# Patient Record
Sex: Female | Born: 1950 | Race: White | Hispanic: No | Marital: Married | Smoking: Never smoker
Health system: Southern US, Community
[De-identification: ages and names within clinical notes are randomized; demographics above are authoritative.]

## PROBLEM LIST (undated history)

## (undated) DIAGNOSIS — Z9989 Dependence on other enabling machines and devices: Secondary | ICD-10-CM

## (undated) DIAGNOSIS — G4733 Obstructive sleep apnea (adult) (pediatric): Secondary | ICD-10-CM

## (undated) DIAGNOSIS — I10 Essential (primary) hypertension: Secondary | ICD-10-CM

## (undated) DIAGNOSIS — R079 Chest pain, unspecified: Secondary | ICD-10-CM

## (undated) DIAGNOSIS — C801 Malignant (primary) neoplasm, unspecified: Secondary | ICD-10-CM

## (undated) DIAGNOSIS — E079 Disorder of thyroid, unspecified: Secondary | ICD-10-CM

## (undated) DIAGNOSIS — M1990 Unspecified osteoarthritis, unspecified site: Secondary | ICD-10-CM

## (undated) DIAGNOSIS — F32A Depression, unspecified: Secondary | ICD-10-CM

## (undated) DIAGNOSIS — K219 Gastro-esophageal reflux disease without esophagitis: Secondary | ICD-10-CM

## (undated) DIAGNOSIS — E042 Nontoxic multinodular goiter: Secondary | ICD-10-CM

## (undated) DIAGNOSIS — J309 Allergic rhinitis, unspecified: Secondary | ICD-10-CM

## (undated) DIAGNOSIS — D649 Anemia, unspecified: Secondary | ICD-10-CM

## (undated) DIAGNOSIS — Z8669 Personal history of other diseases of the nervous system and sense organs: Secondary | ICD-10-CM

## (undated) DIAGNOSIS — F411 Generalized anxiety disorder: Secondary | ICD-10-CM

## (undated) HISTORY — DX: Depression, unspecified: F32.A

## (undated) HISTORY — DX: Malignant (primary) neoplasm, unspecified: C80.1

## (undated) HISTORY — DX: Disorder of thyroid, unspecified: E07.9

## (undated) HISTORY — DX: Gastro-esophageal reflux disease without esophagitis: K21.9

## (undated) HISTORY — DX: Allergic rhinitis, unspecified: J30.9

## (undated) HISTORY — DX: Unspecified osteoarthritis, unspecified site: M19.90

## (undated) HISTORY — PX: NASAL SINUS SURGERY: SHX719

## (undated) HISTORY — DX: Obstructive sleep apnea (adult) (pediatric): G47.33

## (undated) HISTORY — DX: Essential (primary) hypertension: I10

## (undated) HISTORY — DX: Personal history of other diseases of the nervous system and sense organs: Z86.69

## (undated) HISTORY — DX: Chest pain, unspecified: R07.9

## (undated) HISTORY — DX: Dependence on other enabling machines and devices: Z99.89

## (undated) HISTORY — DX: Anemia, unspecified: D64.9

## (undated) HISTORY — DX: Nontoxic multinodular goiter: E04.2

## (undated) HISTORY — PX: CHOLECYSTECTOMY: SHX55

---

## 1992-02-10 HISTORY — PX: TUBAL LIGATION: SHX77

## 1998-04-27 ENCOUNTER — Other Ambulatory Visit: Admission: RE | Admit: 1998-04-27 | Discharge: 1998-04-27 | Payer: Self-pay | Admitting: *Deleted

## 1998-06-03 ENCOUNTER — Ambulatory Visit (HOSPITAL_COMMUNITY): Admission: RE | Admit: 1998-06-03 | Discharge: 1998-06-03 | Payer: Self-pay | Admitting: *Deleted

## 1998-09-20 ENCOUNTER — Other Ambulatory Visit: Admission: RE | Admit: 1998-09-20 | Discharge: 1998-09-20 | Payer: Self-pay | Admitting: *Deleted

## 1999-08-27 ENCOUNTER — Encounter: Admission: RE | Admit: 1999-08-27 | Discharge: 1999-08-27 | Payer: Self-pay | Admitting: *Deleted

## 1999-08-27 ENCOUNTER — Encounter: Payer: Self-pay | Admitting: *Deleted

## 1999-08-27 ENCOUNTER — Other Ambulatory Visit: Admission: RE | Admit: 1999-08-27 | Discharge: 1999-08-27 | Payer: Self-pay | Admitting: *Deleted

## 2000-08-27 ENCOUNTER — Encounter: Admission: RE | Admit: 2000-08-27 | Discharge: 2000-08-27 | Payer: Self-pay | Admitting: *Deleted

## 2000-08-27 ENCOUNTER — Other Ambulatory Visit: Admission: RE | Admit: 2000-08-27 | Discharge: 2000-08-27 | Payer: Self-pay | Admitting: Obstetrics and Gynecology

## 2000-08-27 ENCOUNTER — Encounter: Payer: Self-pay | Admitting: *Deleted

## 2001-09-02 ENCOUNTER — Encounter: Admission: RE | Admit: 2001-09-02 | Discharge: 2001-09-02 | Payer: Self-pay | Admitting: *Deleted

## 2001-09-02 ENCOUNTER — Other Ambulatory Visit: Admission: RE | Admit: 2001-09-02 | Discharge: 2001-09-02 | Payer: Self-pay | Admitting: Obstetrics and Gynecology

## 2001-09-02 ENCOUNTER — Encounter: Payer: Self-pay | Admitting: *Deleted

## 2002-09-04 ENCOUNTER — Encounter: Payer: Self-pay | Admitting: Obstetrics and Gynecology

## 2002-09-04 ENCOUNTER — Encounter: Admission: RE | Admit: 2002-09-04 | Discharge: 2002-09-04 | Payer: Self-pay | Admitting: Obstetrics and Gynecology

## 2002-09-04 ENCOUNTER — Other Ambulatory Visit: Admission: RE | Admit: 2002-09-04 | Discharge: 2002-09-04 | Payer: Self-pay | Admitting: Obstetrics and Gynecology

## 2004-01-30 ENCOUNTER — Other Ambulatory Visit: Payer: Self-pay

## 2004-01-30 ENCOUNTER — Emergency Department: Payer: Self-pay | Admitting: Internal Medicine

## 2004-01-31 ENCOUNTER — Ambulatory Visit: Payer: Self-pay | Admitting: Internal Medicine

## 2005-05-13 ENCOUNTER — Encounter: Admission: RE | Admit: 2005-05-13 | Discharge: 2005-05-13 | Payer: Self-pay | Admitting: Internal Medicine

## 2005-06-03 ENCOUNTER — Encounter: Admission: RE | Admit: 2005-06-03 | Discharge: 2005-06-03 | Payer: Self-pay | Admitting: Internal Medicine

## 2005-06-08 ENCOUNTER — Emergency Department: Payer: Self-pay | Admitting: Unknown Physician Specialty

## 2005-09-16 ENCOUNTER — Ambulatory Visit: Payer: Self-pay | Admitting: Otolaryngology

## 2006-06-16 ENCOUNTER — Encounter: Admission: RE | Admit: 2006-06-16 | Discharge: 2006-06-16 | Payer: Self-pay | Admitting: Obstetrics and Gynecology

## 2006-07-27 ENCOUNTER — Ambulatory Visit: Payer: Self-pay | Admitting: Unknown Physician Specialty

## 2006-10-03 ENCOUNTER — Ambulatory Visit: Payer: Self-pay | Admitting: Emergency Medicine

## 2007-09-15 ENCOUNTER — Encounter: Admission: RE | Admit: 2007-09-15 | Discharge: 2007-09-15 | Payer: Self-pay | Admitting: Obstetrics and Gynecology

## 2007-09-20 ENCOUNTER — Ambulatory Visit: Payer: Self-pay | Admitting: Unknown Physician Specialty

## 2008-09-21 ENCOUNTER — Ambulatory Visit: Payer: Self-pay | Admitting: Internal Medicine

## 2008-10-19 DIAGNOSIS — I10 Essential (primary) hypertension: Secondary | ICD-10-CM | POA: Insufficient documentation

## 2009-04-22 ENCOUNTER — Encounter: Admission: RE | Admit: 2009-04-22 | Discharge: 2009-04-22 | Payer: Self-pay | Admitting: Obstetrics and Gynecology

## 2010-03-02 ENCOUNTER — Encounter: Payer: Self-pay | Admitting: Internal Medicine

## 2010-03-03 ENCOUNTER — Encounter: Payer: Self-pay | Admitting: Obstetrics and Gynecology

## 2010-03-20 ENCOUNTER — Other Ambulatory Visit: Payer: Self-pay | Admitting: Obstetrics and Gynecology

## 2010-03-20 DIAGNOSIS — Z1231 Encounter for screening mammogram for malignant neoplasm of breast: Secondary | ICD-10-CM

## 2010-04-03 ENCOUNTER — Other Ambulatory Visit: Payer: Self-pay | Admitting: Obstetrics and Gynecology

## 2010-04-03 DIAGNOSIS — Z780 Asymptomatic menopausal state: Secondary | ICD-10-CM

## 2010-04-28 ENCOUNTER — Other Ambulatory Visit: Payer: Self-pay

## 2010-04-28 ENCOUNTER — Ambulatory Visit: Payer: Self-pay

## 2010-05-15 ENCOUNTER — Ambulatory Visit
Admission: RE | Admit: 2010-05-15 | Discharge: 2010-05-15 | Disposition: A | Discharge status: Home / Self Care | Payer: BC Managed Care – PPO | Source: Ambulatory Visit | Attending: Obstetrics and Gynecology | Admitting: Obstetrics and Gynecology

## 2010-05-15 DIAGNOSIS — Z780 Asymptomatic menopausal state: Secondary | ICD-10-CM

## 2010-05-15 DIAGNOSIS — Z1231 Encounter for screening mammogram for malignant neoplasm of breast: Secondary | ICD-10-CM

## 2010-10-01 ENCOUNTER — Other Ambulatory Visit: Payer: Self-pay | Admitting: Obstetrics and Gynecology

## 2010-10-01 DIAGNOSIS — N63 Unspecified lump in unspecified breast: Secondary | ICD-10-CM

## 2010-10-01 DIAGNOSIS — N644 Mastodynia: Secondary | ICD-10-CM

## 2010-10-06 ENCOUNTER — Ambulatory Visit
Admission: RE | Admit: 2010-10-06 | Discharge: 2010-10-06 | Disposition: A | Discharge status: Home / Self Care | Payer: BC Managed Care – PPO | Source: Ambulatory Visit | Attending: Obstetrics and Gynecology | Admitting: Obstetrics and Gynecology

## 2010-10-06 DIAGNOSIS — N63 Unspecified lump in unspecified breast: Secondary | ICD-10-CM

## 2010-10-06 DIAGNOSIS — N644 Mastodynia: Secondary | ICD-10-CM

## 2010-12-04 ENCOUNTER — Encounter (INDEPENDENT_AMBULATORY_CARE_PROVIDER_SITE_OTHER): Payer: Self-pay | Admitting: General Surgery

## 2010-12-04 ENCOUNTER — Ambulatory Visit (INDEPENDENT_AMBULATORY_CARE_PROVIDER_SITE_OTHER): Payer: BC Managed Care – PPO | Admitting: General Surgery

## 2010-12-04 VITALS — BP 146/90 | HR 64 | Temp 97.1°F | Resp 14 | Ht 64.0 in | Wt 225.2 lb

## 2010-12-04 DIAGNOSIS — N6009 Solitary cyst of unspecified breast: Secondary | ICD-10-CM | POA: Insufficient documentation

## 2010-12-04 NOTE — Patient Instructions (Signed)

## 2010-12-04 NOTE — Progress Notes (Signed)
Chief Complaint  Patient presents with  . Other    Patient requested 2nd opinion. MGM & [place] at [facility] on 10/05/10.    HPI Lisa Chambers is a 60 y.o. female.   HPI This is a 60 year old female with no prior history of any breast complaints. She does have a family history of a mom with inflammatory breast cancer. She comes in today with a several month history of a left subareolar mass that is somewhat increased in size over this time. She reports no other complaints referable to her breast including any nipple discharge, tenderness, or breast masses. She underwent a mammogram that wasn't negative and an ultrasound that showed a small simple cyst in this area. She requested evaluation for treatment of this. Past Medical History  Diagnosis Date  . GERD (gastroesophageal reflux disease)   . Thyroid disease   . Cancer     skin  . Asthma   . Hearing loss     Past Surgical History  Procedure Date  . Tubal ligation 1994  . Nasal sinus surgery 1996, 2007    Family History  Problem Relation Age of Onset  . Cancer Mother     breast  . Cancer Father     lung  . Pneumonia Father   . Cancer Maternal Grandmother     colon    Social History History  Substance Use Topics  . Smoking status: Never Smoker   . Smokeless tobacco: Never Used  . Alcohol Use: No    Allergies  Allergen Reactions  . Aspirin Swelling    Lips only.    Current Outpatient Prescriptions  Medication Sig Dispense Refill  . ABILIFY 2 MG tablet 2 mg daily.       . APLENZIN 522 MG TB24 daily.       . FEMHRT LOW DOSE 0.5-2.5 MG-MCG per tablet 1 tablet daily.       Marland Kitchen Fexofenadine HCl (ALLEGRA PO) Take by mouth as needed.        . Fluticasone-Salmeterol (ADVAIR HFA IN) Inhale into the lungs 2 (two) times daily.        . hydrochlorothiazide (MICROZIDE) 12.5 MG capsule Take 12.5 mg by mouth daily.        Marland Kitchen levothyroxine (SYNTHROID) 25 MCG tablet Take 25 mcg by mouth daily.        Marland Kitchen LORazepam (ATIVAN) 0.5 MG  tablet 0.5 mg as needed.       . montelukast (SINGULAIR) 10 MG tablet 10 mg daily.       Marland Kitchen PRILOSEC OTC 20 MG tablet 20 mg daily.       . Pseudoephedrine-Guaifenesin (PSEUDOVENT PO) Take by mouth as needed.        Marland Kitchen VIIBRYD 40 MG TABS 40 mg daily.       . fluticasone (FLONASE) 50 MCG/ACT nasal spray 2 sprays daily.         Review of Systems Review of Systems  HENT: Positive for hearing loss.   All other systems reviewed and are negative.    Blood pressure 146/90, pulse 64, temperature 97.1 F (36.2 C), temperature source Temporal, resp. rate 14, height 5\' 4"  (1.626 m), weight 225 lb 3.2 oz (102.15 kg).  Physical Exam Physical Exam  Constitutional: She appears well-developed and well-nourished.  Eyes: No scleral icterus.  Neck: Neck supple.  Cardiovascular: Normal rate, regular rhythm and normal heart sounds.   Pulmonary/Chest: Effort normal and breath sounds normal. She has no wheezes. She has no rales.  Right breast exhibits no inverted nipple, no mass, no nipple discharge, no skin change and no tenderness. Left breast exhibits mass. Left breast exhibits no inverted nipple, no nipple discharge, no skin change and no tenderness. Breasts are symmetrical.    Lymphadenopathy:    She has no cervical adenopathy.    She has no axillary adenopathy.    Data Reviewed *RADIOLOGY REPORT*  Clinical Data: Palpable lump left breast  DIGITAL DIAGNOSTIC LEFT MAMMOGRAM WITH CAD AND LEFT BREAST  ULTRASOUND:  Comparison: Jun 16, 2006, September 15, 2007, May 15, 2010, April 22, 2009  Findings: CC and MLO views of the left breast and spot tangential  view of left breast are submitted. Scattered fibroglandular tissue  noted in the left breast unchanged. No suspicious abnormality is  identified within the left breast.  Mammographic images were processed with CAD.  Ultrasound is performed, showing a 0.56 cm oval simple cyst at the  left breast nine o'clock position subareolar region palpable area.    IMPRESSION:  Benign findings, recommend routine screening mammogram back on  schedule    Assessment    Right breast cyst Family history of breast cancer    Plan    We discussed that this is almost certainly a benign cyst. She does have a family history of breast cancer and is very concerned about this. We went over all of the different options what we've decided upon is to send her back to the breast center to  have them aspirate this cyst is the first. I think it would be a reasonable step and she is very happy with this solution also.       Seara Hinesley 12/04/2010, 11:00 AM

## 2010-12-17 ENCOUNTER — Ambulatory Visit
Admission: RE | Admit: 2010-12-17 | Discharge: 2010-12-17 | Disposition: A | Discharge status: Home / Self Care | Payer: BC Managed Care – PPO | Source: Ambulatory Visit | Attending: General Surgery | Admitting: General Surgery

## 2010-12-17 ENCOUNTER — Other Ambulatory Visit (INDEPENDENT_AMBULATORY_CARE_PROVIDER_SITE_OTHER): Payer: Self-pay | Admitting: General Surgery

## 2010-12-17 DIAGNOSIS — N6009 Solitary cyst of unspecified breast: Secondary | ICD-10-CM

## 2010-12-19 ENCOUNTER — Telehealth (INDEPENDENT_AMBULATORY_CARE_PROVIDER_SITE_OTHER): Payer: Self-pay

## 2010-12-19 NOTE — Telephone Encounter (Signed)
LMOM for pt to call me so I can give her some results per Dr Rolan Bucco

## 2010-12-19 NOTE — Telephone Encounter (Signed)
Returned my call so I gave her the good news about the cyst going away and pt not needing an appt with Dr Dwain Sarna unless if she feels something again. The pt just needs to do her self exams and follow up with her mgm's./ [facility]

## 2011-04-14 ENCOUNTER — Other Ambulatory Visit: Payer: Self-pay | Admitting: Obstetrics and Gynecology

## 2011-04-14 DIAGNOSIS — Z1231 Encounter for screening mammogram for malignant neoplasm of breast: Secondary | ICD-10-CM

## 2011-05-19 ENCOUNTER — Ambulatory Visit
Admission: RE | Admit: 2011-05-19 | Discharge: 2011-05-19 | Disposition: A | Discharge status: Home / Self Care | Payer: BC Managed Care – PPO | Source: Ambulatory Visit | Attending: Obstetrics and Gynecology | Admitting: Obstetrics and Gynecology

## 2011-05-19 DIAGNOSIS — Z1231 Encounter for screening mammogram for malignant neoplasm of breast: Secondary | ICD-10-CM

## 2011-11-05 ENCOUNTER — Ambulatory Visit: Payer: Self-pay | Admitting: Unknown Physician Specialty

## 2011-11-23 ENCOUNTER — Ambulatory Visit: Payer: Self-pay | Admitting: Anesthesiology

## 2011-11-24 ENCOUNTER — Emergency Department: Payer: Self-pay | Admitting: Emergency Medicine

## 2011-11-24 LAB — COMPREHENSIVE METABOLIC PANEL
Albumin: 3.4 g/dL (ref 3.4–5.0)
Alkaline Phosphatase: 95 U/L (ref 50–136)
Bilirubin,Total: 0.4 mg/dL (ref 0.2–1.0)
Calcium, Total: 8.9 mg/dL (ref 8.5–10.1)
Chloride: 110 mmol/L — ABNORMAL HIGH (ref 98–107)
Creatinine: 1.06 mg/dL (ref 0.60–1.30)
Glucose: 99 mg/dL (ref 65–99)
Osmolality: 285 (ref 275–301)
Sodium: 143 mmol/L (ref 136–145)
Total Protein: 6.8 g/dL (ref 6.4–8.2)

## 2011-11-24 LAB — CBC
HCT: 42.5 % (ref 35.0–47.0)
HGB: 14.4 g/dL (ref 12.0–16.0)
MCH: 27.7 pg (ref 26.0–34.0)
MCHC: 33.9 g/dL (ref 32.0–36.0)
MCV: 82 fL (ref 80–100)
Platelet: 263 10*3/uL (ref 150–440)
RBC: 5.19 10*6/uL (ref 3.80–5.20)
RDW: 13.3 % (ref 11.5–14.5)

## 2011-11-24 LAB — URINALYSIS, COMPLETE
Bilirubin,UR: NEGATIVE
Blood: NEGATIVE
Ketone: NEGATIVE
Nitrite: NEGATIVE
Ph: 7 (ref 4.5–8.0)
Protein: NEGATIVE
RBC,UR: 1 /HPF (ref 0–5)
Specific Gravity: 1.013 (ref 1.003–1.030)
Squamous Epithelial: 4

## 2011-11-24 LAB — LIPASE, BLOOD: Lipase: 272 U/L (ref 73–393)

## 2011-11-26 ENCOUNTER — Ambulatory Visit: Payer: Self-pay | Admitting: Surgery

## 2012-02-29 ENCOUNTER — Ambulatory Visit: Payer: Self-pay | Admitting: Unknown Physician Specialty

## 2012-03-16 ENCOUNTER — Ambulatory Visit: Payer: Self-pay | Admitting: Otolaryngology

## 2012-04-25 ENCOUNTER — Other Ambulatory Visit: Payer: Self-pay

## 2012-04-25 DIAGNOSIS — Z1231 Encounter for screening mammogram for malignant neoplasm of breast: Secondary | ICD-10-CM

## 2012-05-19 ENCOUNTER — Ambulatory Visit
Admission: RE | Admit: 2012-05-19 | Discharge: 2012-05-19 | Disposition: A | Discharge status: Home / Self Care | Payer: BC Managed Care – PPO | Source: Ambulatory Visit

## 2012-05-19 DIAGNOSIS — Z1231 Encounter for screening mammogram for malignant neoplasm of breast: Secondary | ICD-10-CM

## 2012-09-03 ENCOUNTER — Emergency Department: Payer: Self-pay | Admitting: Emergency Medicine

## 2013-04-20 ENCOUNTER — Other Ambulatory Visit: Payer: Self-pay

## 2013-04-20 DIAGNOSIS — Z1231 Encounter for screening mammogram for malignant neoplasm of breast: Secondary | ICD-10-CM

## 2013-04-20 DIAGNOSIS — Z803 Family history of malignant neoplasm of breast: Secondary | ICD-10-CM

## 2013-06-01 ENCOUNTER — Ambulatory Visit
Admission: RE | Admit: 2013-06-01 | Discharge: 2013-06-01 | Disposition: A | Discharge status: Home / Self Care | Payer: BC Managed Care – PPO | Source: Ambulatory Visit

## 2013-06-01 ENCOUNTER — Encounter (INDEPENDENT_AMBULATORY_CARE_PROVIDER_SITE_OTHER): Payer: Self-pay

## 2013-06-01 DIAGNOSIS — Z1231 Encounter for screening mammogram for malignant neoplasm of breast: Secondary | ICD-10-CM

## 2013-06-01 DIAGNOSIS — Z803 Family history of malignant neoplasm of breast: Secondary | ICD-10-CM

## 2013-10-01 DIAGNOSIS — J45909 Unspecified asthma, uncomplicated: Secondary | ICD-10-CM | POA: Insufficient documentation

## 2013-10-01 DIAGNOSIS — N183 Chronic kidney disease, stage 3 unspecified: Secondary | ICD-10-CM | POA: Insufficient documentation

## 2013-10-01 DIAGNOSIS — F325 Major depressive disorder, single episode, in full remission: Secondary | ICD-10-CM | POA: Insufficient documentation

## 2013-10-01 DIAGNOSIS — E785 Hyperlipidemia, unspecified: Secondary | ICD-10-CM | POA: Insufficient documentation

## 2013-10-01 DIAGNOSIS — I129 Hypertensive chronic kidney disease with stage 1 through stage 4 chronic kidney disease, or unspecified chronic kidney disease: Secondary | ICD-10-CM | POA: Insufficient documentation

## 2013-10-01 DIAGNOSIS — E042 Nontoxic multinodular goiter: Secondary | ICD-10-CM | POA: Insufficient documentation

## 2014-05-29 NOTE — Op Note (Signed)
PATIENT NAME:  DORANN, DAVIDSON MR#:  [id number] DATE OF BIRTH:  [birth year]  DATE OF PROCEDURE:  11/26/2011  PREOPERATIVE DIAGNOSIS: Chronic cholecystitis, cholelithiasis.   POSTOPERATIVE DIAGNOSIS: Chronic cholecystitis, cholelithiasis.      PROCEDURE: Laparoscopic cholecystectomy, cholangiogram.   SURGEON: Rochel Brome, M.D.   ANESTHESIA: General.   INDICATIONS: This 64 year old female has a history of episodes of epigastric pain, ultrasound findings of gallstone impacted in the neck of the gallbladder. Surgery was recommended for definitive treatment.   DESCRIPTION OF PROCEDURE: The patient was placed on the operating table in the supine position under general endotracheal anesthesia. The abdomen was prepared with ChloraPrep and draped in a sterile manner. A short incision was made in the inferior aspect of the umbilicus and carried down to the deep fascia which was grasped with laryngeal hook and elevated. A Veress needle was inserted, aspirated, and irrigated with a saline solution. Next, the peritoneal cavity was insufflated with carbon dioxide. The Veress needle was removed. The 10 mm cannula was inserted. The 10 mm 0 degree laparoscope was inserted to view the peritoneal cavity. Visible omentum, liver and stomach appeared normal. The patient was placed in a reverse Trendelenburg position and turned several degrees to the left. Another incision was made in the epigastrium slightly to the right of the midline to introduce an 11 mm cannula. Two incisions were made in the lateral aspect of the right upper quadrant to introduce two 5-mm cannulas. The gallbladder was retracted towards the right shoulder. The infundibulum was retracted inferiorly and laterally. The infundibulum was mobilized with incision of the visceral peritoneum. The cystic duct was dissected free from surrounding structures. The cystic artery was dissected free from surrounding structures. A critical view of safety was demonstrated.  The porta hepatis was noted. An Endoclip was placed across the cystic duct adjacent to the neck of the gallbladder. An incision was made in the cystic duct to introduce a Reddick catheter. Half-strength Conray-60 dye was injected as the cholangiogram was done with fluoroscopy viewing the biliary tree and flow of dye into the duodenum. No retained stones were seen. The Reddick catheter was removed. The cystic duct was doubly ligated with Endoclips and divided. The cystic artery was controlled with Endoclips and divided. The gallbladder was dissected free from the liver with hook and cautery. The site was irrigated and aspirated multiple times. Numerous small bleeding points were cauterized. Hemostasis was subsequently intact. The gallbladder was brought up to the infraumbilical incision, opened and suctioned. There was a large stone and it was necessary to lengthen the skin incision by approximately 1 cm and lengthen the fascial incision by 1 cm. With additional traction and manipulation, the gallbladder with a large stone was removed and submitted in formalin for routine pathology. The right upper quadrant was further inspected, irrigated and aspirated. Hemostasis was intact. Next, carbon dioxide was allowed to escape from the peritoneal cavity as the cannulas were removed. Several tiny bleeding points in the subcutaneous tissues were cauterized. The fascia at the umbilicus was closed with interrupted 0 Vicryl figure-of-eight sutures. The skin incisions were closed with interrupted 5-0 chromic subcuticular sutures, benzoin, and Steri-Strips. Dressings were applied with paper tape. The patient tolerated surgery satisfactorily and was then prepared for transfer to the recovery room.   ____________________________ Lenna Sciara. Rochel Brome, MD jws:ap D: 11/26/2011 09:26:58 ET T: 11/26/2011 10:45:14 ET JOB#: [id number]  cc: Loreli Dollar, MD, <Dictator> Loreli Dollar MD ELECTRONICALLY SIGNED 11/27/2011 18:54

## 2018-02-24 DIAGNOSIS — M19049 Primary osteoarthritis, unspecified hand: Secondary | ICD-10-CM | POA: Insufficient documentation

## 2019-02-10 HISTORY — PX: BREAST BIOPSY: SHX20

## 2019-07-10 ENCOUNTER — Emergency Department: Payer: Medicare PPO

## 2019-07-10 ENCOUNTER — Encounter: Payer: Self-pay | Admitting: Emergency Medicine

## 2019-07-10 ENCOUNTER — Other Ambulatory Visit: Payer: Self-pay

## 2019-07-10 ENCOUNTER — Emergency Department
Admission: EM | Admit: 2019-07-10 | Discharge: 2019-07-10 | Disposition: A | Discharge status: Home / Self Care | Payer: Medicare PPO | Attending: Emergency Medicine | Admitting: Emergency Medicine

## 2019-07-10 DIAGNOSIS — E039 Hypothyroidism, unspecified: Secondary | ICD-10-CM | POA: Diagnosis not present

## 2019-07-10 DIAGNOSIS — R0789 Other chest pain: Secondary | ICD-10-CM | POA: Diagnosis not present

## 2019-07-10 DIAGNOSIS — Z85828 Personal history of other malignant neoplasm of skin: Secondary | ICD-10-CM | POA: Insufficient documentation

## 2019-07-10 DIAGNOSIS — J45909 Unspecified asthma, uncomplicated: Secondary | ICD-10-CM | POA: Diagnosis not present

## 2019-07-10 DIAGNOSIS — Z79899 Other long term (current) drug therapy: Secondary | ICD-10-CM | POA: Insufficient documentation

## 2019-07-10 DIAGNOSIS — I10 Essential (primary) hypertension: Secondary | ICD-10-CM | POA: Diagnosis not present

## 2019-07-10 LAB — CBC
HCT: 41.2 % (ref 36.0–46.0)
Hemoglobin: 13.6 g/dL (ref 12.0–15.0)
MCH: 27.1 pg (ref 26.0–34.0)
MCHC: 33 g/dL (ref 30.0–36.0)
MCV: 82.1 fL (ref 80.0–100.0)
Platelets: 295 10*3/uL (ref 150–400)
RBC: 5.02 MIL/uL (ref 3.87–5.11)
RDW: 13.1 % (ref 11.5–15.5)
WBC: 10 10*3/uL (ref 4.0–10.5)
nRBC: 0 % (ref 0.0–0.2)

## 2019-07-10 LAB — BASIC METABOLIC PANEL
Anion gap: 10 (ref 5–15)
BUN: 17 mg/dL (ref 8–23)
CO2: 27 mmol/L (ref 22–32)
Calcium: 9.5 mg/dL (ref 8.9–10.3)
Chloride: 101 mmol/L (ref 98–111)
Creatinine, Ser: 0.81 mg/dL (ref 0.44–1.00)
GFR calc Af Amer: >60 mL/min (ref >60)
GFR calc non Af Amer: >60 mL/min (ref >60)
Glucose, Bld: 92 mg/dL (ref 70–99)
Potassium: 3.9 mmol/L (ref 3.5–5.1)
Sodium: 138 mmol/L (ref 135–145)

## 2019-07-10 LAB — TROPONIN I (HIGH SENSITIVITY)
Troponin I (High Sensitivity): 3 ng/L (ref <18)
Troponin I (High Sensitivity): <2 ng/L (ref <18)

## 2019-07-10 NOTE — ED Triage Notes (Signed)
Pt c/o pains in left chest starting last week.  Pain is intermittent.  Today patient had more acute onset of pain and difficulty getting deep breath with it. Pain is mid/right chest today. Last week was just a pain for couple seconds and went away.

## 2019-07-10 NOTE — ED Notes (Signed)
Inquired of EDP [place] if he would like 2nd troponin. Stated he would let this RN know after seeing pt at bedside.

## 2019-07-10 NOTE — ED Provider Notes (Signed)
[facility] Emergency Department Provider Note   ____________________________________________   First MD Initiated Contact with Patient 07/10/19 [phone]     (approximate)  I have reviewed the triage vital signs and the nursing notes.   HISTORY  Chief Complaint Chest Pain    HPI Lisa Chambers is a 69 y.o. female with possible history of asthma, hypothyroidism, and GERD who presents to the ED complaining of chest pain.  Patient reports that 1 week ago she had an electric shocklike feeling over her left chest that resolved after about 30 seconds.  She was feeling better up until today when she had onset of similar pain across the right side of her chest.  She was sitting in the car and symptoms were not associated with exertion.  The pain lasted for about 1 hour before resolving.  She did have some mild difficulty breathing with the symptoms, but this is also resolved.  She denies any recent fevers or cough, has not had any pain or swelling in her legs.  She denies any cardiac history or history of DVT/PE.        Past Medical History:  Diagnosis Date  . Asthma   . Cancer ([place])    skin  . GERD (gastroesophageal reflux disease)   . Hearing loss   . Thyroid disease     Patient Active Problem List   Diagnosis Date Noted  . Cyst, breast solitary 12/04/2010    Past Surgical History:  Procedure Laterality Date  . NASAL SINUS SURGERY  1996, 2007  . TUBAL LIGATION  1994    Prior to Admission medications   Medication Sig Start Date End Date Taking? Authorizing Provider  ABILIFY 2 MG tablet 2 mg daily.  11/29/10   [provider]  APLENZIN 522 MG TB24 daily.  11/28/10   [provider]  FEMHRT LOW DOSE 0.5-2.5 MG-MCG per tablet 1 tablet daily.  11/13/10   [provider]  Fexofenadine HCl (ALLEGRA PO) Take by mouth as needed.      [provider]  fluticasone (FLONASE) 50 MCG/ACT nasal spray 2 sprays daily.  11/18/10    [provider]  Fluticasone-Salmeterol (ADVAIR HFA IN) Inhale into the lungs 2 (two) times daily.      [provider]  hydrochlorothiazide (MICROZIDE) 12.5 MG capsule Take 12.5 mg by mouth daily.      [provider]  levothyroxine (SYNTHROID) 25 MCG tablet Take 25 mcg by mouth daily.      [provider]  LORazepam (ATIVAN) 0.5 MG tablet 0.5 mg as needed.  11/29/10   [provider]  montelukast (SINGULAIR) 10 MG tablet 10 mg daily.  11/23/10   [provider]  PRILOSEC OTC 20 MG tablet 20 mg daily.  10/02/10   [provider]  Pseudoephedrine-Guaifenesin (PSEUDOVENT PO) Take by mouth as needed.      [provider]  VIIBRYD 40 MG TABS 40 mg daily.  11/30/10   [provider]    Allergies Aspirin  Family History  Problem Relation Age of Onset  . Cancer Mother        breast  . Cancer Father        lung  . Pneumonia Father   . Cancer Maternal Grandmother        colon    Social History Social History   Tobacco Use  . Smoking status: Never Smoker  . Smokeless tobacco: Never Used  Substance Use Topics  .  Alcohol use: No  . Drug use: No    Review of Systems  Constitutional: No fever/chills Eyes: No visual changes. ENT: No sore throat. Cardiovascular: Positive for chest pain. Respiratory: Positive for shortness of breath. Gastrointestinal: No abdominal pain.  No nausea, no vomiting.  No diarrhea.  No constipation. Genitourinary: Negative for dysuria. Musculoskeletal: Negative for back pain. Skin: Negative for rash. Neurological: Negative for headaches, focal weakness or numbness.  ____________________________________________   PHYSICAL EXAM:  VITAL SIGNS: ED Triage Vitals  Enc Vitals Group     BP 07/10/19 1402 (!) 144/86     Pulse Rate 07/10/19 1402 69     Resp 07/10/19 1402 18     Temp 07/10/19 1402 98.8 F (37.1 C)     Temp Source 07/10/19 1402 Oral     SpO2 07/10/19 1402 100 %      Weight 07/10/19 1400 228 lb (103.4 kg)     Height 07/10/19 1400 5\' 5"  (1.651 m)     Head Circumference --      Peak Flow --      Pain Score 07/10/19 1359 6     Pain Loc --      Pain Edu? --      Excl. in [place]? --     Constitutional: Alert and oriented. Eyes: Conjunctivae are normal. Head: Atraumatic. Nose: No congestion/rhinnorhea. Mouth/Throat: Mucous membranes are moist. Neck: Normal ROM Cardiovascular: Normal rate, regular rhythm. Grossly normal heart sounds. Respiratory: Normal respiratory effort.  No retractions. Lungs CTAB.  No chest wall tenderness to palpation. Gastrointestinal: Soft and nontender. No distention. Genitourinary: deferred Musculoskeletal: No lower extremity tenderness nor edema. Neurologic:  Normal speech and language. No gross focal neurologic deficits are appreciated. Skin:  Skin is warm, dry and intact. No rash noted. Psychiatric: Mood and affect are normal. Speech and behavior are normal.  ____________________________________________   LABS (all labs ordered are listed, but only abnormal results are displayed)  Labs Reviewed  BASIC METABOLIC PANEL  CBC  TROPONIN I (HIGH SENSITIVITY)  TROPONIN I (HIGH SENSITIVITY)   ____________________________________________  EKG  ED ECG REPORT I, Blake Divine, the attending physician, personally viewed and interpreted this ECG.   Date: 07/10/2019  EKG Time: 13:58  Rate: 68  Rhythm: normal sinus rhythm  Axis: Normal  Intervals:none  ST&T Change: None   PROCEDURES  Procedure(s) performed (including Critical Care):  Procedures   ____________________________________________   INITIAL IMPRESSION / ASSESSMENT AND PLAN / ED COURSE       69 year old female with history of hypertension, asthma, and GERD who presents to the ED complaining of intermittent episodes of chest pain, had one on the left side of her chest 1 week ago and another episode again today on the right side of her chest.   Initial work-up is unremarkable, EKG without evidence of arrhythmia or ischemia, initial troponin within normal limits.  Chest x-ray shows atelectasis versus infiltrate, but I would have low suspicion for pneumonia given she has not had any fevers or cough and symptoms have now entirely resolved.  I also doubt PE given reassuring vital signs with resolution of symptoms.  If repeat troponin is within normal limits, patient be appropriate for discharge home with PCP follow-up.  Repeat troponin within normal limits, patient has had no recurrence of chest pain since initial evaluation.  Given atypical symptoms with unremarkable work-up, she is appropriate for discharge home with PCP follow-up.  She was counseled to return to the ED for new or worsening symptoms, patient agrees  with plan.      ____________________________________________   FINAL CLINICAL IMPRESSION(S) / ED DIAGNOSES  Final diagnoses:  Atypical chest pain     ED Discharge Orders    None       Note:  This document was prepared using Dragon voice recognition software and may include unintentional dictation errors.   Blake Divine, MD 07/10/19 1719

## 2019-07-10 NOTE — ED Notes (Signed)
Attempted to collect 2nd trop at L ac without success.

## 2019-08-10 DIAGNOSIS — R7303 Prediabetes: Secondary | ICD-10-CM | POA: Insufficient documentation

## 2019-08-10 DIAGNOSIS — R7309 Other abnormal glucose: Secondary | ICD-10-CM | POA: Insufficient documentation

## 2019-08-10 DIAGNOSIS — Z0000 Encounter for general adult medical examination without abnormal findings: Secondary | ICD-10-CM | POA: Insufficient documentation

## 2019-12-08 ENCOUNTER — Other Ambulatory Visit: Payer: Self-pay

## 2019-12-08 ENCOUNTER — Emergency Department: Payer: Medicare PPO

## 2019-12-08 ENCOUNTER — Encounter: Payer: Self-pay | Admitting: Emergency Medicine

## 2019-12-08 ENCOUNTER — Emergency Department
Admission: EM | Admit: 2019-12-08 | Discharge: 2019-12-08 | Disposition: A | Discharge status: Home / Self Care | Payer: Medicare PPO | Attending: Emergency Medicine | Admitting: Emergency Medicine

## 2019-12-08 DIAGNOSIS — E039 Hypothyroidism, unspecified: Secondary | ICD-10-CM | POA: Diagnosis not present

## 2019-12-08 DIAGNOSIS — Z85828 Personal history of other malignant neoplasm of skin: Secondary | ICD-10-CM | POA: Insufficient documentation

## 2019-12-08 DIAGNOSIS — Z79899 Other long term (current) drug therapy: Secondary | ICD-10-CM | POA: Insufficient documentation

## 2019-12-08 DIAGNOSIS — J45909 Unspecified asthma, uncomplicated: Secondary | ICD-10-CM | POA: Insufficient documentation

## 2019-12-08 DIAGNOSIS — M546 Pain in thoracic spine: Secondary | ICD-10-CM | POA: Diagnosis not present

## 2019-12-08 DIAGNOSIS — R0789 Other chest pain: Secondary | ICD-10-CM | POA: Insufficient documentation

## 2019-12-08 LAB — CBC
HCT: 42.3 % (ref 36.0–46.0)
Hemoglobin: 13.8 g/dL (ref 12.0–15.0)
MCH: 27.3 pg (ref 26.0–34.0)
MCHC: 32.6 g/dL (ref 30.0–36.0)
MCV: 83.6 fL (ref 80.0–100.0)
Platelets: 310 10*3/uL (ref 150–400)
RBC: 5.06 MIL/uL (ref 3.87–5.11)
RDW: 13.1 % (ref 11.5–15.5)
WBC: 8.8 10*3/uL (ref 4.0–10.5)
nRBC: 0 % (ref 0.0–0.2)

## 2019-12-08 LAB — TROPONIN I (HIGH SENSITIVITY)
Troponin I (High Sensitivity): 3 ng/L (ref <18)
Troponin I (High Sensitivity): 3 ng/L (ref <18)

## 2019-12-08 LAB — BASIC METABOLIC PANEL
Anion gap: 10 (ref 5–15)
BUN: 17 mg/dL (ref 8–23)
CO2: 26 mmol/L (ref 22–32)
Calcium: 9.2 mg/dL (ref 8.9–10.3)
Chloride: 99 mmol/L (ref 98–111)
Creatinine, Ser: 0.85 mg/dL (ref 0.44–1.00)
GFR, Estimated: >60 mL/min (ref >60)
Glucose, Bld: 97 mg/dL (ref 70–99)
Potassium: 4 mmol/L (ref 3.5–5.1)
Sodium: 135 mmol/L (ref 135–145)

## 2019-12-08 MED ORDER — IOHEXOL 350 MG/ML SOLN
100.0000 mL | Freq: Once | INTRAVENOUS | Status: AC | PRN
  Administered 2019-12-08: 100 mL via INTRAVENOUS
  Filled 2019-12-08: qty 100

## 2019-12-08 NOTE — ED Provider Notes (Signed)
[facility] Emergency Department Provider Note   ____________________________________________    I have reviewed the triage vital signs and the nursing notes.   HISTORY  Chief Complaint Chest Pain     HPI Lisa Chambers is a 69 y.o. female who presents with complaints of left-sided upper back pain with intermittent radiation to her chest.  Patient reports she developed the back pain 1 to 2 days ago.  She became concerned when pain radiated to her chest today.  She denies injury to the area.  Denies shortness of breath any.  No pleurisy.  No nausea vomiting.  No history of blood clots.  No recent travel or calf pain or swelling.  No history of heart disease reported.  No fevers chills or cough  Past Medical History:  Diagnosis Date  . Asthma   . Cancer ([place])    skin  . GERD (gastroesophageal reflux disease)   . Hearing loss   . Thyroid disease     Patient Active Problem List   Diagnosis Date Noted  . Cyst, breast solitary 12/04/2010    Past Surgical History:  Procedure Laterality Date  . NASAL SINUS SURGERY  1996, 2007  . TUBAL LIGATION  1994    Prior to Admission medications   Medication Sig Start Date End Date Taking? Authorizing Provider  ABILIFY 2 MG tablet 2 mg daily.  11/29/10   [provider]  APLENZIN 522 MG TB24 daily.  11/28/10   [provider]  FEMHRT LOW DOSE 0.5-2.5 MG-MCG per tablet 1 tablet daily.  11/13/10   [provider]  Fexofenadine HCl (ALLEGRA PO) Take by mouth as needed.      [provider]  fluticasone (FLONASE) 50 MCG/ACT nasal spray 2 sprays daily.  11/18/10   [provider]  Fluticasone-Salmeterol (ADVAIR HFA IN) Inhale into the lungs 2 (two) times daily.      [provider]  hydrochlorothiazide (MICROZIDE) 12.5 MG capsule Take 12.5 mg by mouth daily.      [provider]  levothyroxine (SYNTHROID) 25 MCG tablet Take 25 mcg by mouth daily.       [provider]  LORazepam (ATIVAN) 0.5 MG tablet 0.5 mg as needed.  11/29/10   [provider]  montelukast (SINGULAIR) 10 MG tablet 10 mg daily.  11/23/10   [provider]  PRILOSEC OTC 20 MG tablet 20 mg daily.  10/02/10   [provider]  Pseudoephedrine-Guaifenesin (PSEUDOVENT PO) Take by mouth as needed.      [provider]  VIIBRYD 40 MG TABS 40 mg daily.  11/30/10   [provider]     Allergies Aspirin  Family History  Problem Relation Age of Onset  . Cancer Mother        breast  . Cancer Father        lung  . Pneumonia Father   . Cancer Maternal Grandmother        colon    Social History Social History   Tobacco Use  . Smoking status: Never Smoker  . Smokeless tobacco: Never Used  Substance Use Topics  . Alcohol use: No  . Drug use: No    Review of Systems  Constitutional: No fever/chills Eyes: No visual changes.  ENT: No sore throat. Cardiovascular: As above Respiratory: As above Gastrointestinal: No abdominal pain.  No nausea, no vomiting.   Genitourinary: Negative for dysuria. Musculoskeletal: Negative for back pain. Skin: Negative for rash. Neurological: Negative  for headaches or weakness   ____________________________________________   PHYSICAL EXAM:  VITAL SIGNS: ED Triage Vitals  Enc Vitals Group     BP 12/08/19 1233 (!) 149/84     Pulse Rate 12/08/19 1233 79     Resp 12/08/19 1233 14     Temp 12/08/19 1233 98.2 F (36.8 C)     Temp Source 12/08/19 1233 Oral     SpO2 12/08/19 1233 100 %     Weight 12/08/19 1234 99.8 kg (220 lb)     Height 12/08/19 1234 1.651 m (5\' 5" )     Head Circumference --      Peak Flow --      Pain Score 12/08/19 1234 5     Pain Loc --      Pain Edu? --      Excl. in [place]? --     Constitutional: Alert and oriented.  Pleasant and interactive  Nose: No congestion/rhinnorhea. Mouth/Throat: Mucous membranes are moist.   Neck:  Painless  ROM Cardiovascular: Normal rate, regular rhythm. Grossly normal heart sounds.  Good peripheral circulation. Respiratory: Normal respiratory effort.  No retractions. Lungs CTAB. Gastrointestinal: Soft and nontender. No distention.  No CVA tenderness. Genitourinary: deferred Musculoskeletal: Left upper back, just medial to the shoulder blade, point tenderness likely muscular in the location of where her pain is, no calf pain or swelling, 2+ pulses bilaterally Neurologic:  Normal speech and language. No gross focal neurologic deficits are appreciated.  Skin:  Skin is warm, dry and intact. No rash noted. Psychiatric: Mood and affect are normal. Speech and behavior are normal.  ____________________________________________   LABS (all labs ordered are listed, but only abnormal results are displayed)  Labs Reviewed  BASIC METABOLIC PANEL  CBC  TROPONIN I (HIGH SENSITIVITY)  TROPONIN I (HIGH SENSITIVITY)   ____________________________________________  EKG  ED ECG REPORT I, Lavonia Drafts, the attending physician, personally viewed and interpreted this ECG.  Date: 12/08/2019  Rhythm: normal sinus rhythm QRS Axis: normal Intervals: normal ST/T Wave abnormalities: normal Narrative Interpretation: no evidence of acute ischemia  ____________________________________________  RADIOLOGY  Chest x-ray viewed by me, no infiltrate or effusion CT angiography reviewed by me, no evidence of dissection ____________________________________________   PROCEDURES  Procedure(s) performed: No  Procedures   Critical Care performed: No ____________________________________________   INITIAL IMPRESSION / ASSESSMENT AND PLAN / ED COURSE  Pertinent labs & imaging results that were available during my care of the patient were reviewed by me and considered in my medical decision making (see chart for details).  Patient presents with back pain as noted above.  Some radiation to the  chest.  Differential includes musculoskeletal back pain, less likely dissection, no shortness of breath to suggest PE.  Delta troponin and EKG are reassuring, not consistent with ACS.  We will send for CT angiography to evaluate for dissection given age and history of high blood pressure.  CTA reviewed by radiology and no evidence of dissection. Appropriate for d/c with outpatient f/u. tx for ms injury      ____________________________________________   FINAL CLINICAL IMPRESSION(S) / ED DIAGNOSES  Final diagnoses:  Atypical chest pain  Acute left-sided thoracic back pain        Note:  This document was prepared using Dragon voice recognition software and may include unintentional dictation errors.   Lavonia Drafts, MD 12/08/19 1946

## 2019-12-08 NOTE — ED Triage Notes (Signed)
Pain behind left shoulder blade started last night andnow goes across chest.  Some breathlessness.

## 2019-12-13 ENCOUNTER — Other Ambulatory Visit: Payer: Self-pay

## 2019-12-13 ENCOUNTER — Telehealth (INDEPENDENT_AMBULATORY_CARE_PROVIDER_SITE_OTHER): Payer: Medicare PPO | Admitting: Psychiatry

## 2019-12-13 ENCOUNTER — Other Ambulatory Visit: Payer: Self-pay | Admitting: Internal Medicine

## 2019-12-13 ENCOUNTER — Encounter: Payer: Self-pay | Admitting: Psychiatry

## 2019-12-13 DIAGNOSIS — Z0000 Encounter for general adult medical examination without abnormal findings: Secondary | ICD-10-CM

## 2019-12-13 DIAGNOSIS — F3342 Major depressive disorder, recurrent, in full remission: Secondary | ICD-10-CM

## 2019-12-13 DIAGNOSIS — Z8659 Personal history of other mental and behavioral disorders: Secondary | ICD-10-CM

## 2019-12-13 MED ORDER — VENLAFAXINE HCL ER 75 MG PO CP24: 75.0000 mg | ORAL_CAPSULE | Freq: Two times a day (BID) | ORAL | 1 refills | Status: DC

## 2019-12-13 MED ORDER — BUPROPION HCL ER (XL) 300 MG PO TB24: ORAL_TABLET | ORAL | 1 refills | Status: DC

## 2019-12-13 NOTE — Progress Notes (Signed)
Virtual Visit via Video Note  I connected with Lisa Chambers on 12/13/19 at  1:00 PM EDT by a video enabled telemedicine application and verified that I am speaking with the correct person using two identifiers.  Location Provider Location : [facility] Patient Location : Home  Participants: Patient , Provider   I discussed the limitations of evaluation and management by telemedicine and the availability of in person appointments. The patient expressed understanding and agreed to proceed.  I discussed the assessment and treatment plan with the patient. The patient was provided an opportunity to ask questions and all were answered. The patient agreed with the plan and demonstrated an understanding of the instructions.   The patient was advised to call back or seek an in-person evaluation if the symptoms worsen or if the condition fails to improve as anticipated.    Psychiatric Initial Adult Assessment   Patient Identification: Lisa Chambers MRN:  [id number] Date of Evaluation:  12/13/2019 Referral Source: Hyman Bible [facility] Chief Complaint:   Chief Complaint    Establish Care     Visit Diagnosis:    ICD-10-CM   1. MDD (major depressive disorder), recurrent, in full remission ([place])  F33.42 buPROPion (WELLBUTRIN XL) 300 MG 24 hr tablet    venlafaxine XR (EFFEXOR-XR) 75 MG 24 hr capsule  2. History of ADHD  Z86.59     History of Present Illness:  Lisa Chambers is a 69 year old Caucasian female, married, retired, lives in [place] has a history of depression, attention and concentration problems, history of ADD, obstructive sleep apnea on CPAP, asthma, hypertension, history of thyroid nodule, GERD, was evaluated by telemedicine today.  Patient reports she was diagnosed with depression several years ago.  She reports she had her first episode of depression around 1995 when her mother got diagnosed with breast cancer.  She reports she has tried multiple medications in the past and  currently takes Wellbutrin and venlafaxine.  This combination has been effective.  She currently reports her depressive symptoms are stable.  She denies any sadness, crying spells.  She reports sleep is good.  She currently takes Ambien as needed.  She also has a history of obstructive sleep apnea and is compliant with her CPAP.  Patient denies any significant anxiety symptoms.  Patient denies any history of trauma.  She denies any obsessions or compulsions, denies any manic or hypomanic episodes.  Patient denies any substance abuse problems.  Patient does report that few years ago she struggled with her attention and focus and was started on Adderall.  She however reports she never had any ADHD testing done.  She continues to take the Adderall on a daily basis.  Patient denies any suicidality, homicidality or perceptual disturbances.   Associated Signs/Symptoms: Depression Symptoms:  depressed mood, anhedonia, insomnia, difficulty concentrating, disturbed sleep, Currently stable on medications (Hypo) Manic Symptoms:  Denies Anxiety Symptoms:  Denies Psychotic Symptoms:  Denies PTSD Symptoms: Negative  Past Psychiatric History: Patient was under the care of her provider at [facility].  Most recently her medications were being prescribed by Ms. Hyman Bible PA.  Patient denies history of suicide attempts.  Patient denies inpatient mental health admissions.  Previous Psychotropic Medications: Yes Past trials of medications like Abilify, Effexor, Wellbutrin, Viibryd, Adderall, Ambien  Substance Abuse History in the last 12 months:  No.  Consequences of Substance Abuse: Negative  Past Medical History:  Past Medical History:  Diagnosis Date  . Asthma   . Cancer ([place])  skin  . GERD (gastroesophageal reflux disease)   . Hearing loss   . HTN (hypertension)   . OSA on CPAP   . Thyroid disease     Past Surgical History:  Procedure Laterality Date  . NASAL SINUS  SURGERY  1996, 2007  . TUBAL LIGATION  1994    Family Psychiatric History: Brother-alcoholism.  Patient denies any mental health problems in her family.  Family History:  Family History  Problem Relation Age of Onset  . Cancer Mother        breast  . Cancer Father        lung  . Pneumonia Father   . Cancer Maternal Grandmother        colon  . Alcohol abuse Brother     Social History:   Social History   Socioeconomic History  . Marital status: Married    Spouse name: Not on file  . Number of children: Not on file  . Years of education: Not on file  . Highest education level: Not on file  Occupational History  . Not on file  Tobacco Use  . Smoking status: Never Smoker  . Smokeless tobacco: Never Used  Substance and Sexual Activity  . Alcohol use: No  . Drug use: No  . Sexual activity: Not on file  Other Topics Concern  . Not on file  Social History Narrative  . Not on file   Social Determinants of Health   Financial Resource Strain:   . Difficulty of Paying Living Expenses: Not on file  Food Insecurity:   . Worried About Charity fundraiser in the Last Year: Not on file  . Ran Out of Food in the Last Year: Not on file  Transportation Needs:   . Lack of Transportation (Medical): Not on file  . Lack of Transportation (Non-Medical): Not on file  Physical Activity:   . Days of Exercise per Week: Not on file  . Minutes of Exercise per Session: Not on file  Stress:   . Feeling of Stress : Not on file  Social Connections:   . Frequency of Communication with Friends and Family: Not on file  . Frequency of Social Gatherings with Friends and Family: Not on file  . Attends Religious Services: Not on file  . Active Member of Clubs or Organizations: Not on file  . Attends Archivist Meetings: Not on file  . Marital Status: Not on file    Additional Social History: Patient was born in [place].  She grew up in [place].  She had a good childhood.  She moved  to [facility] recently from [place].  Patient is married and lives with her husband in [place].  She has a daughter and a son.  Her daughter lives close to her.  She has several grandchildren.  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  Currently she is retired.  Allergies:   Allergies  Allergen Reactions  . Erythromycin Other (See Comments)    GI upset GI upset   . Levofloxacin Other (See Comments)    Joint pain Joint pain   . Tetracycline Other (See Comments)    GI upset GI upset   . Aspirin Swelling    Lips only.    Metabolic Disorder Labs: No results found for: HGBA1C, MPG No results found for: PROLACTIN No results found for: CHOL, TRIG, HDL, CHOLHDL, VLDL, LDLCALC No results found for:  TSH  Therapeutic Level Labs: No results found for: LITHIUM No results found for: CBMZ No results found for: VALPROATE  Current Medications: Current Outpatient Medications  Medication Sig Dispense Refill  . amphetamine-dextroamphetamine (ADDERALL) 30 MG tablet Take by mouth.    Marland Kitchen buPROPion (WELLBUTRIN XL) 300 MG 24 hr tablet bupropion HCl XL 300 mg 24 hr tablet, extended release 90 tablet 1  . Cholecalciferol 25 MCG (1000 UT) capsule Take by mouth.    . cycloSPORINE (RESTASIS) 0.05 % ophthalmic emulsion Restasis 0.05 % eye drops in a dropperette  INT 1 GTT IN OU BID UTD    . estradiol (MINIVELLE) 0.05 MG/24HR patch Minivelle 0.05 mg/24 hr transdermal patch  Apply 1 patch twice a week by transdermal route.    Marland Kitchen Fexofenadine HCl (ALLEGRA PO) Take by mouth as needed.      . fluticasone (FLONASE) 50 MCG/ACT nasal spray 2 sprays daily.     . Fluticasone-Salmeterol (ADVAIR HFA IN) Inhale into the lungs 2 (two) times daily.      . hydrochlorothiazide (MICROZIDE) 12.5 MG capsule Take 25 mg by mouth daily.     Marland Kitchen L-Lysine 1000 MG TABS Take 1 tablet by mouth daily.    . meloxicam (MOBIC) 15 MG tablet Take 15 mg by mouth  daily.    . montelukast (SINGULAIR) 10 MG tablet 10 mg daily.     Marland Kitchen PRILOSEC OTC 20 MG tablet 20 mg daily.     . progesterone (PROMETRIUM) 100 MG capsule progesterone micronized 100 mg capsule  TK 1 C PO QD    . venlafaxine XR (EFFEXOR-XR) 75 MG 24 hr capsule Take 1 capsule (75 mg total) by mouth 2 (two) times daily. Take 1 capsule daily at 8 AM and 3 PM 180 capsule 1  . zolpidem (AMBIEN) 10 MG tablet Take 1 tablet by mouth at bedtime.     No current facility-administered medications for this visit.    Musculoskeletal: Strength & Muscle Tone: UTA Gait & Station: Seated Patient leans: N/A  Psychiatric Specialty Exam: Review of Systems  Psychiatric/Behavioral: Negative for agitation, behavioral problems, confusion, decreased concentration, dysphoric mood, hallucinations, self-injury, sleep disturbance and suicidal ideas. The patient is not nervous/anxious and is not hyperactive.   All other systems reviewed and are negative.   There were no vitals taken for this visit.There is no height or weight on file to calculate BMI.  General Appearance: Casual  Eye Contact:  Fair  Speech:  Clear and Coherent  Volume:  Normal  Mood:  Euthymic  Affect:  Congruent  Thought Process:  Goal Directed and Descriptions of Associations: Intact  Orientation:  Full (Time, Place, and Person)  Thought Content:  Logical  Suicidal Thoughts:  No  Homicidal Thoughts:  No  Memory:  Immediate;   Fair Recent;   Fair Remote;   Fair  Judgement:  Fair  Insight:  Fair  Psychomotor Activity:  Normal  Concentration:  Concentration: Fair and Attention Span: Fair  Recall:  [employer] of Knowledge:Fair  Language: Fair  Akathisia:  No  Handed:  Right  AIMS (if indicated):  UTA  Assets:  Communication Skills Desire for Improvement Housing Social Support  ADL's:  Intact  Cognition: WNL  Sleep:  Fair   Screenings: PHQ2-9     Video Visit from 12/13/2019 in [facility]  PHQ-2  Total Score 0  PHQ-9 Total Score 0      Assessment and Plan: Lisa Chambers is a 69 year old Caucasian female, retired, married,  has a history of depression, attention concentration deficit, multiple medical problems including obstructive sleep apnea on CPAP was evaluated by telemedicine today.  Patient with psychosocial stressors of the current pandemic, husband's health issues however is currently stable on medications.  Patient does report a remote history of being diagnosed with ADHD, denies any attention and focus problems in her childhood or as an young adult.  Patient will benefit from the following plan.  Plan MDD-in remission Continue Wellbutrin XL 300 mg p.o. daily Effexor extended release 75 mg p.o. twice daily  Attention and concentration deficit-rule out ADHD Will refer for ADHD testing.  Patient is agreeable. We will hold off Adderall for now.  I have reviewed the following labs-TSH dated 08/16/2019-within normal limits.  Will refer patient for CBT.  I have reviewed medical records from [facility] - patient with MDD ,ADD- [place] wellbutrin, effexor and adderall ."   Follow-up in clinic 2 to 3 months or sooner if needed.  I have spent atleast 45 minutes  face to face by video with patient today. More than 50 % of the time was spent for preparing to see the patient ( e.g., review of test, records ), obtaining and to review and separately obtained history , ordering medications and test ,psychoeducation and supportive psychotherapy and care coordination,as well as documenting clinical information in electronic health record. This note was generated in part or whole with voice recognition software. Voice recognition is usually quite accurate but there are transcription errors that can and very often do occur. I apologize for any typographical errors that were not detected and corrected.        Ursula Alert, MD 11/4/202112:58 PM

## 2019-12-20 ENCOUNTER — Encounter: Payer: Self-pay | Admitting: Licensed Clinical Social Worker

## 2019-12-20 ENCOUNTER — Ambulatory Visit (INDEPENDENT_AMBULATORY_CARE_PROVIDER_SITE_OTHER): Payer: Medicare PPO | Admitting: Licensed Clinical Social Worker

## 2019-12-20 ENCOUNTER — Other Ambulatory Visit: Payer: Self-pay

## 2019-12-20 ENCOUNTER — Encounter: Payer: Self-pay | Admitting: Psychology

## 2019-12-20 DIAGNOSIS — Z8659 Personal history of other mental and behavioral disorders: Secondary | ICD-10-CM

## 2019-12-20 DIAGNOSIS — F439 Reaction to severe stress, unspecified: Secondary | ICD-10-CM

## 2019-12-20 DIAGNOSIS — F3342 Major depressive disorder, recurrent, in full remission: Secondary | ICD-10-CM
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
## 2020-01-10 ENCOUNTER — Encounter: Payer: Self-pay | Admitting: Licensed Clinical Social Worker

## 2020-01-10 ENCOUNTER — Ambulatory Visit (INDEPENDENT_AMBULATORY_CARE_PROVIDER_SITE_OTHER): Payer: Medicare PPO | Admitting: Licensed Clinical Social Worker

## 2020-01-10 ENCOUNTER — Other Ambulatory Visit: Payer: Self-pay

## 2020-01-10 DIAGNOSIS — Z8659 Personal history of other mental and behavioral disorders: Secondary | ICD-10-CM

## 2020-01-10 DIAGNOSIS — F439 Reaction to severe stress, unspecified: Secondary | ICD-10-CM

## 2020-01-10 DIAGNOSIS — F3342 Major depressive disorder, recurrent, in full remission: Secondary | ICD-10-CM | POA: Diagnosis not present

## 2020-01-10 NOTE — Progress Notes (Signed)
Virtual Visit via Video Note  I connected with [name] on 01/10/20 at 11:00 AM EST by a video enabled telemedicine application and verified that I am speaking with the correct person using two identifiers.  Participating Parties Patient Provider  Location: Patient: Vehicle Provider: Home Office   I discussed the limitations of evaluation and management by telemedicine and the availability of in person appointments. The patient expressed understanding and agreed to proceed.   THERAPY PROGRESS NOTE  Session Time: 82 Minutes  Participation Level: Active  Behavioral Response: Neat and Well GroomedAlertTearful  Type of Therapy: Individual Therapy  Treatment Goals addressed: Communication: Assertiveness and Coping  Interventions: CBT  Summary: Lisa Chambers is a 69 y.o. female who presents with some depression sxs and situational stress. Pt identified current stressors and attempts to cope. Pt reported that she often does things to avoid upsetting anyone and is recognizing its negative impact on her own emotions and energy levels. Pt described difficulties in communicating assertively with spouse and adult daughter and often feels in the middle of family tensions. Pt was receptive to suggestions and willing to try setting boundaries using I statements between now and next session.  Suicidal/Homicidal: No  Therapist Response: Therapist met with patient and mother for follow up after completing CCA. Therapist and patient reviewed treatment plan and goals. Pt in agreement. Therapist and patient explored family dynamics and communication difficulties. Therapist provided psychoeducation around the triangulation trap and encouraged patient to express needs and make time for self with spouse and family. Pt was receptive.  Plan: Return again in 1 month.  Diagnosis: Axis I: Situational Stress, MDD, Recurrent, In Remission, Hx of ADHD    Axis II: N/A  Josephine Igo, LCSW,  LCAS 01/10/2020

## 2020-01-11 ENCOUNTER — Ambulatory Visit: Payer: Medicare PPO | Admitting: Cardiovascular Disease

## 2020-01-12 ENCOUNTER — Ambulatory Visit
Admission: RE | Admit: 2020-01-12 | Discharge: 2020-01-12 | Disposition: A | Discharge status: Home / Self Care | Payer: Medicare PPO | Source: Ambulatory Visit | Attending: Internal Medicine | Admitting: Internal Medicine

## 2020-01-12 ENCOUNTER — Other Ambulatory Visit: Payer: Self-pay

## 2020-01-12 DIAGNOSIS — Z0000 Encounter for general adult medical examination without abnormal findings: Secondary | ICD-10-CM

## 2020-01-16 ENCOUNTER — Other Ambulatory Visit: Payer: Self-pay | Admitting: Internal Medicine

## 2020-01-16 DIAGNOSIS — R928 Other abnormal and inconclusive findings on diagnostic imaging of breast: Secondary | ICD-10-CM

## 2020-01-18 ENCOUNTER — Other Ambulatory Visit: Payer: Self-pay

## 2020-01-18 ENCOUNTER — Ambulatory Visit: Payer: Medicare PPO | Admitting: Cardiovascular Disease

## 2020-01-18 ENCOUNTER — Encounter: Payer: Self-pay | Admitting: Cardiovascular Disease

## 2020-01-18 VITALS — BP 130/84 | HR 95 | Ht 65.0 in | Wt 226.2 lb

## 2020-01-18 DIAGNOSIS — I10 Essential (primary) hypertension: Secondary | ICD-10-CM | POA: Diagnosis not present

## 2020-01-18 DIAGNOSIS — R079 Chest pain, unspecified: Secondary | ICD-10-CM | POA: Diagnosis not present

## 2020-01-18 NOTE — Patient Instructions (Signed)
Medication Instructions:  Your physician recommends that you continue on your current medications as directed. Please refer to the Current Medication list given to you today.  *If you need a refill on your cardiac medications before your next appointment, please call your pharmacy*   Lab Work: None ordered   If you have labs (blood work) drawn today and your tests are completely normal, you will receive your results only by: Marland Kitchen MyChart Message (if you have MyChart) OR . A paper copy in the mail If you have any lab test that is abnormal or we need to change your treatment, we will call you to review the results.   Testing/Procedures: Your physician has requested that you have a lexiscan myoview. For further information please visit [email or web address]. Please follow instruction sheet, as given.   Follow-Up: At [facility], you and your health needs are our priority.  As part of our continuing mission to provide you with exceptional heart care, we have created designated Provider Care Teams.  These Care Teams include your primary Cardiologist (physician) and Advanced Practice Providers (APPs -  Physician Assistants and Nurse Practitioners) who all work together to provide you with the care you need, when you need it.  We recommend signing up for the patient portal called "MyChart".  Sign up information is provided on this After Visit Summary.  MyChart is used to connect with patients for Virtual Visits (Telemedicine).  Patients are able to view lab/test results, encounter notes, upcoming appointments, etc.  Non-urgent messages can be sent to your provider as well.   To learn more about what you can do with MyChart, go to [email or web address].    Your next appointment:   As needed   The format for your next appointment:   In Person  Provider:   You may see Dr. Fletcher Anon or one of the following Advanced Practice Providers on your designated Care Team:    Murray Hodgkins, NP  Christell Faith, PA-C  Marrianne Mood, PA-C  Cadence Zayante, [place]  Laurann Montana, NP    Other Instructions [place]  Your caregiver has ordered a Stress Test with nuclear imaging. The purpose of this test is to evaluate the blood supply to your heart muscle. This procedure is referred to as a "Non-Invasive Stress Test." This is because other than having an IV started in your vein, nothing is inserted or "invades" your body. Cardiac stress tests are done to find areas of poor blood flow to the heart by determining the extent of coronary artery disease (CAD). Some patients exercise on a treadmill, which naturally increases the blood flow to your heart, while others who are  unable to walk on a treadmill due to physical limitations have a pharmacologic/chemical stress agent called Lexiscan . This medicine will mimic walking on a treadmill by temporarily increasing your coronary blood flow.   Please note: these test may take anywhere between 2-4 hours to complete  PLEASE REPORT TO [place] AT THE FIRST DESK WILL DIRECT YOU WHERE TO GO  Date of Procedure:_____________________________________  Arrival Time for Procedure:______________________________  Instructions regarding medication:     __X__:  Hold other medications as follows: HCTZ the morning of the test  PLEASE NOTIFY THE OFFICE AT LEAST 24 HOURS IN ADVANCE IF YOU ARE UNABLE TO KEEP YOUR APPOINTMENT.  [phone] AND  PLEASE NOTIFY NUCLEAR MEDICINE AT [facility] AT LEAST 24 HOURS IN ADVANCE IF YOU ARE UNABLE TO KEEP YOUR APPOINTMENT. [phone]  How to  prepare for your Myoview test:  1. Do not eat or drink after midnight 2. No caffeine for 24 hours prior to test 3. No smoking 24 hours prior to test. 4. Your medication may be taken with water.  If your doctor stopped a medication because of this test, do not take that medication. 5. Ladies, please do not wear dresses.  Skirts or pants are appropriate.  Please wear a short sleeve shirt. 6. No perfume or lotion. 7. Wear comfortable walking shoes. No heels!

## 2020-01-18 NOTE — Progress Notes (Unsigned)
Cardiology Office Note   Date:  01/18/2020   ID:  Lisa Chambers, DOB [birth year], MRN [id number]  PCP:  Kirk Ruths, MD  Cardiologist:   Kathlyn Sacramento, MD   Chief Complaint  Patient presents with  . Other    Chest pain. Meds reviewed verbally with pt.      History of Present Illness: Lisa Chambers is a 69 y.o. female who presents for evaluation of chest pain.  She has no prior cardiac history.  She has known history of essential hypertension and hyperlipidemia.  She is not diabetic or smoker.  Her younger brother had CABG and has history of peripheral arterial disease.  She reports having a stress test more than 10 years ago that was normal.  Over the last few months, she experienced intermittent episodes of substernal chest pain described as indigestion in the substernal left-sided area mostly at rest and rarely with exertion.  In addition, she noticed some exertional dyspnea.  She went to the emergency room 1 time in October due to back pain that radiated to her chest.  Her troponin was normal.  CT scan of the chest showed no evidence of aortic dissection and no significant coronary calcifications.    Past Medical History:  Diagnosis Date  . Asthma   . Cancer ([place])    skin  . GERD (gastroesophageal reflux disease)   . Hearing loss   . HTN (hypertension)   . OSA on CPAP   . Thyroid disease     Past Surgical History:  Procedure Laterality Date  . NASAL SINUS SURGERY  1996, 2007  . TUBAL LIGATION  1994     Current Outpatient Medications  Medication Sig Dispense Refill  . amphetamine-dextroamphetamine (ADDERALL) 30 MG tablet Take by mouth.    Marland Kitchen buPROPion (WELLBUTRIN XL) 300 MG 24 hr tablet bupropion HCl XL 300 mg 24 hr tablet, extended release 90 tablet 1  . Cholecalciferol 25 MCG (1000 UT) capsule Take by mouth.    . cycloSPORINE (RESTASIS) 0.05 % ophthalmic emulsion Restasis 0.05 % eye drops in a dropperette  INT 1 GTT IN OU BID UTD    . estradiol  (VIVELLE-DOT) 0.05 MG/24HR patch Minivelle 0.05 mg/24 hr transdermal patch  Apply 1 patch twice a week by transdermal route.    Marland Kitchen Fexofenadine HCl (ALLEGRA PO) Take by mouth as needed.    . fluticasone (FLONASE) 50 MCG/ACT nasal spray 2 sprays daily.     . Fluticasone-Salmeterol (ADVAIR HFA IN) Inhale into the lungs 2 (two) times daily.    . hydrochlorothiazide (HYDRODIURIL) 25 MG tablet Take 25 mg by mouth daily.    Marland Kitchen L-Lysine 1000 MG TABS Take 1 tablet by mouth daily.    . meloxicam (MOBIC) 15 MG tablet Take 15 mg by mouth daily.    . montelukast (SINGULAIR) 10 MG tablet 10 mg daily.     . Omega-3 Fatty Acids (FISH OIL) 1200 MG CAPS Take by mouth daily in the afternoon.    Marland Kitchen PRILOSEC OTC 20 MG tablet 20 mg daily.     . progesterone (PROMETRIUM) 100 MG capsule Take 100 mg by mouth daily.    Marland Kitchen venlafaxine XR (EFFEXOR-XR) 75 MG 24 hr capsule Take 1 capsule (75 mg total) by mouth 2 (two) times daily. Take 1 capsule daily at 8 AM and 3 PM 180 capsule 1  . zolpidem (AMBIEN) 10 MG tablet Take 1 tablet by mouth at bedtime.     No current facility-administered medications  for this visit.    Allergies:   Erythromycin, Levofloxacin, Tetracycline, and Aspirin    Social History:  The patient  reports that she has never smoked. She has never used smokeless tobacco. She reports that she does not drink alcohol and does not use drugs.   Family History:  The patient's family history includes Alcohol abuse in her brother; Cancer in her father, maternal grandmother, and mother; Pneumonia in her father.    ROS:  Please see the history of present illness.   Otherwise, review of systems are positive for none.   All other systems are reviewed and negative.    PHYSICAL EXAM: VS:  BP 130/84 (BP Location: Right Arm, Patient Position: Sitting, Cuff Size: Normal)   Pulse 95   Ht 5\' 5"  (1.651 m)   Wt 226 lb 4 oz (102.6 kg)   SpO2 98%   BMI 37.65 kg/m  , BMI Body mass index is 37.65 kg/m. GEN: Well  nourished, well developed, in no acute distress  HEENT: normal  Neck: no JVD, carotid bruits, or masses Cardiac: RRR; no murmurs, rubs, or gallops,no edema  Respiratory:  clear to auscultation bilaterally, normal work of breathing GI: soft, nontender, nondistended, + BS MS: no deformity or atrophy  Skin: warm and dry, no rash Neuro:  Strength and sensation are intact Psych: euthymic mood, full affect   EKG:  EKG is ordered today. The ekg ordered today demonstrates normal sinus rhythm with low voltage.  No significant ST or T wave changes.   Recent Labs: 12/08/2019: BUN 17; Creatinine, Ser 0.85; Hemoglobin 13.8; Platelets 310; Potassium 4.0; Sodium 135    Lipid Panel No results found for: CHOL, TRIG, HDL, CHOLHDL, VLDL, LDLCALC, LDLDIRECT    Wt Readings from Last 3 Encounters:  01/18/20 226 lb 4 oz (102.6 kg)  12/08/19 220 lb (99.8 kg)  07/10/19 228 lb (103.4 kg)        PAD Screen 01/18/2020  Previous PAD dx? No  Previous surgical procedure? Yes  Pain with walking? No  Feet/toe relief with dangling? No  Painful, non-healing ulcers? No  Extremities discolored? No      ASSESSMENT AND PLAN:  1.  Chest pain with multiple risk factors for coronary artery disease: EKG with no ischemic changes.  Given risk factors, recommend evaluation with a Lexiscan Myoview.  2.  Essential hypertension: Blood pressure is controlled on current medications.    Disposition:   FU with me as needed.  Signed,  Kathlyn Sacramento, MD  01/18/2020 3:35 PM    [place]

## 2020-01-23 ENCOUNTER — Ambulatory Visit: Payer: Medicare PPO

## 2020-01-23 ENCOUNTER — Encounter: Payer: Self-pay | Admitting: Psychology

## 2020-01-23 ENCOUNTER — Encounter: Payer: Medicare PPO | Attending: Psychology | Admitting: Psychology

## 2020-01-23 ENCOUNTER — Other Ambulatory Visit: Payer: Self-pay

## 2020-01-23 DIAGNOSIS — Z8659 Personal history of other mental and behavioral disorders: Secondary | ICD-10-CM | POA: Insufficient documentation

## 2020-01-23 DIAGNOSIS — F0150 Vascular dementia without behavioral disturbance: Secondary | ICD-10-CM | POA: Diagnosis not present

## 2020-01-23 DIAGNOSIS — F325 Major depressive disorder, single episode, in full remission: Secondary | ICD-10-CM | POA: Diagnosis not present

## 2020-01-23 DIAGNOSIS — F01A0 Vascular dementia, mild, without behavioral disturbance, psychotic disturbance, mood disturbance, and anxiety: Secondary | ICD-10-CM

## 2020-01-23 NOTE — Progress Notes (Addendum)
NEUROBEHAVIORAL STATUS EXAM   Name: Lisa Chambers Date of Birth: April 06, 1950 Date of Interview: 01/23/2020  Reason for Referral:  Lisa Chambers is a 69 y.o. female who is referred for neuropsychological evaluation by Dr. Shea Chambers (Psychiatry) to evaluate cognitive and emotional functioning in light of reported changes in attention and short-term memory and assess for ADHD.   History of Presenting Illness: Lisa Chambers is a 69 year old Caucasian female, married, retired Chief Technology Officer, who has a history of depression, decreased attention and concentration, obstructive sleep apnea on CPAP, hypertension, thyroid nodule, and GERD. She recently moved back to [facility] and saw Dr. Shea Chambers on 12/13/19. Patient was under the care of her provider at [place]. Recent medications were being prescribed by Ms. Lisa Bible PA.   Patient reports she was diagnosed with depression several years ago.  She reports she had her first episode of depression around 1995 when her mother got diagnosed with breast cancer.  She reports she has tried multiple medications in the past and currently takes Wellbutrin and venlafaxine.  This combination has been effective.  She currently reports her depressive symptoms are stable. She denies any sadness, crying spells. Patient denies any suicidality, homicidality or perceptual disturbances. She reports sleep is good.  She currently takes Ambien as needed.  She also has a history of obstructive sleep apnea and is compliant with her CPAP.  Patient denies any significant anxiety symptoms. Patient denies any history of trauma. She denies any obsessions or compulsions, denies any manic or hypomanic episodes. Patient denies any substance abuse problems. Patient does report that few years ago she struggled with her attention and focus and was started on Adderall.  She however reports she never had any ADHD testing done.  She continues to take the Adderall on  a daily basis.   She has tried several medications in the past to treat mood symptoms and disrupted sleep (I.e.,  Abilify, Effexor, Wellbutrin, Viibryd, Adderall, Ambien  Onset/Course: She recalled depression onset around 1995 that was around the time her mother was diagnosed with breast cancer (e.g., died after 4 years at age 60).  She benefited from antidepressant medication. She reports primary difficulties with divided attention, sustained focus, forgetfulness, hyperactivity, and general restlessness.  She described problems sitting still for more than 20 minutes at a time. Forgetfulness has gotten worse over the last couple years she has forgotten to pay bills taken wrong turns while driving taking her own medications etc.  She describes problems with word finding and retrieval  Upon direct questioning, the patient reported:   Forgetting recent conversations/events: Denied  Repeating statements/questions: Denied  Misplacing/losing items: Mild  Forgetting appointments or other obligations: Denied  Forgetting to take medications: Denied   Difficulty concentrating: Yes, within the last 5 years.  Starting but not finishing tasks: On occasion within the last 5 years  Distracted easily: Yes, longstanding "cant take a lot of movement I cant control"  Processing information more slowly: Denied   Word-finding difficulty: Yes, since 2009-2010, but worse in the last 5 years.  Word substitutions: Yes, within the last 5 years; noticeable by others during conversations  Writing difficulty: No change  Spelling difficulty: Within the last year, "know how to spell the word but write it differently"; not when typing.  Comprehension difficulty: Denied   Getting lost when driving: Yes, couple times in the past 2-3 years. Get side tracked or distracted.  Making wrong turns when driving: On occasion Uncertain about directions  when driving or passenger: Endorsed some increased confusion/disorientation in  familiar places. Has got lost a few times but been able to find her way back home.   Family neuro hx: None reported Any family hx dementia? None reported  Current Functioning: Work: Retired Education officer, museum  Complex ADLs Driving: Currently able to perform w/o difficulty  Medication management: Able to perform w/o difficulty Management of finances: Able to perform w/o difficulty Appointments: Able to perform w/o difficulty Cooking: No trouble but some difficulty planning meals for the week.   Medical/Physical complaints:  Any hx of stroke/TIA, MI, LOC/TBI, Sz? Concussion in 6th grade. Possible stroke in past, dropping things more (right hand) since 2010. Hx falls?  Denied  Balance, probs walking? Denied  Sleep: Insomnia? OSA? CPAP? REM sleep beh sx? Obstructive Sleep Apnea 10-12 years ago. Several sleep studies. 3 years ago. - compliant with CPAP    Visual illusions/hallucinations? Denied  Appetite/Nutrition/Weight changes: Normal  Current mood: mildly depressed  Behavioral disturbance/Personality change: Denied Suicidal Ideation/Intention: Denied  Psychiatric History: History of depression, anxiety, other MH disorder: Difficulty adjusting to mother's death (e.g., breast cancer) in 1995. History of MH treatment: Treated with combination of antidepressant and counseling; described as "very helpful"  History of SI: Denied  History of substance dependence/treatment:   Social History: Born/Raised: Lisa Chambers, [place] Education: 16; Lisa Chambers from [place]   Occupational history:  Marital history: Married 26 years. Husband diagnosed with PD 3 years ago.  Children: 2 grown children  Alcohol:    Denied  Tobacco: Denied. Exposure to second hand smoke as child due to parents smoking SA: Denied   Medical History: Past Medical History:  Diagnosis Date  . Asthma   . Cancer ([place])    skin  . GERD (gastroesophageal reflux disease)   . Hearing loss   . HTN (hypertension)    . OSA on CPAP   . Thyroid disease    Imaging: Abnormal Brain MRI on 0/11/2008 Multiple subcortical and deep white matter lesions possibly related Chronic white matter diseases   Current Medications:  Outpatient Encounter Medications as of 01/23/2020  Medication Sig  . amphetamine-dextroamphetamine (ADDERALL) 30 MG tablet Take by mouth.  Marland Kitchen buPROPion (WELLBUTRIN XL) 300 MG 24 hr tablet bupropion HCl XL 300 mg 24 hr tablet, extended release  . Cholecalciferol 25 MCG (1000 UT) capsule Take by mouth.  . cycloSPORINE (RESTASIS) 0.05 % ophthalmic emulsion Restasis 0.05 % eye drops in a dropperette  INT 1 GTT IN OU BID UTD  . estradiol (VIVELLE-DOT) 0.05 MG/24HR patch Minivelle 0.05 mg/24 hr transdermal patch  Apply 1 patch twice a week by transdermal route.  Marland Kitchen Fexofenadine HCl (ALLEGRA PO) Take by mouth as needed.  . fluticasone (FLONASE) 50 MCG/ACT nasal spray 2 sprays daily.   . Fluticasone-Salmeterol (ADVAIR HFA IN) Inhale into the lungs 2 (two) times daily.  . hydrochlorothiazide (HYDRODIURIL) 25 MG tablet Take 25 mg by mouth daily.  Marland Kitchen L-Lysine 1000 MG TABS Take 1 tablet by mouth daily.  . meloxicam (MOBIC) 15 MG tablet Take 15 mg by mouth daily.  . montelukast (SINGULAIR) 10 MG tablet 10 mg daily.   . Omega-3 Fatty Acids (FISH OIL) 1200 MG CAPS Take by mouth daily in the afternoon.  Marland Kitchen PRILOSEC OTC 20 MG tablet 20 mg daily.   . progesterone (PROMETRIUM) 100 MG capsule Take 100 mg by mouth daily.  Marland Kitchen venlafaxine XR (EFFEXOR-XR) 75 MG 24 hr capsule Take 1 capsule (75 mg total) by mouth 2 (two)  times daily. Take 1 capsule daily at 8 AM and 3 PM  . zolpidem (AMBIEN) 10 MG tablet Take 1 tablet by mouth at bedtime.   No facility-administered encounter medications on file as of 01/23/2020.   Behavioral Observations:   Appearance: Casually and appropriately dressed and groomed Gait: Ambulated independently, no gross abnormalities observed Speech: Fluent; normal rate, rhythm and volume.  minimal word finding difficulty. Thought process: Linear, goal directed, and logical Affect: Full, anxious Interpersonal: Pleasant, appropriate  60 minutes spent face-to-face with patient completing neurobehavioral status exam. 60 minutes spent integrating medical records/clinical data and completing this report. [id number] unit; [id number].   TESTING:  There is medical necessity to proceed with neuropsychological assessment as the results will be used to aid in differential diagnosis (I.e., r/o ADHD) and clinical decision-making and to inform specific treatment recommendations. 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     Clinical Decision Making: In considering the patient's current level of functioning, level of presumed impairment, nature of symptoms, emotional and behavioral responses during the interview, level of literacy, and observed level of motivation, a battery of tests was selected for patient to complete during a separate testing session (scheduled for 03/01/20).  PLAN: The patient will return to complete the above referenced full battery of neuropsychological testing with this provider. Education regarding testing procedures was provided to the patient. Subsequently, the patient will see this provider for a follow-up session at which time her test performances and my impressions and treatment recommendations will be reviewed in detail.   Evaluation ongoing; full report to follow.

## 2020-01-24 ENCOUNTER — Other Ambulatory Visit: Payer: Self-pay | Admitting: Internal Medicine

## 2020-01-24 ENCOUNTER — Ambulatory Visit
Admission: RE | Admit: 2020-01-24 | Discharge: 2020-01-24 | Disposition: A | Discharge status: Home / Self Care | Payer: Medicare PPO | Source: Ambulatory Visit | Attending: Internal Medicine | Admitting: Internal Medicine

## 2020-01-24 DIAGNOSIS — R928 Other abnormal and inconclusive findings on diagnostic imaging of breast: Secondary | ICD-10-CM

## 2020-01-25 ENCOUNTER — Other Ambulatory Visit: Payer: Self-pay

## 2020-01-25 ENCOUNTER — Encounter
Admission: RE | Admit: 2020-01-25 | Discharge: 2020-01-25 | Disposition: A | Discharge status: Home / Self Care | Payer: Medicare PPO | Source: Ambulatory Visit | Attending: Cardiovascular Disease | Admitting: Cardiovascular Disease

## 2020-01-25 DIAGNOSIS — R079 Chest pain, unspecified: Secondary | ICD-10-CM

## 2020-01-25 MED ORDER — REGADENOSON 0.4 MG/5ML IV SOLN
0.4000 mg | Freq: Once | INTRAVENOUS | Status: AC
  Administered 2020-01-25: 11:00:00 0.4 mg via INTRAVENOUS

## 2020-01-25 MED ORDER — TECHNETIUM TC 99M TETROFOSMIN IV KIT
10.3700 | PACK | Freq: Once | INTRAVENOUS | Status: AC | PRN
  Administered 2020-01-25: 09:00:00 10.37 via INTRAVENOUS

## 2020-01-25 MED ORDER — TECHNETIUM TC 99M TETROFOSMIN IV KIT
30.2800 | PACK | Freq: Once | INTRAVENOUS | Status: AC | PRN
  Administered 2020-01-25: 11:00:00 30.28 via INTRAVENOUS

## 2020-01-26 LAB — NM MYOCAR MULTI W/SPECT W/WALL MOTION / EF
LV dias vol: 94 mL (ref 46–106)
LV sys vol: 29 mL
Peak HR: 113 {beats}/min
Percent HR: 73 %
Rest HR: 57 {beats}/min
SDS: 2
SRS: 1
SSS: 1
TID: 1.02

## 2020-01-30 ENCOUNTER — Other Ambulatory Visit: Payer: Self-pay

## 2020-01-30 ENCOUNTER — Ambulatory Visit
Admission: RE | Admit: 2020-01-30 | Discharge: 2020-01-30 | Disposition: A | Discharge status: Home / Self Care | Payer: Medicare PPO | Source: Ambulatory Visit | Attending: Internal Medicine | Admitting: Internal Medicine

## 2020-01-30 DIAGNOSIS — R928 Other abnormal and inconclusive findings on diagnostic imaging of breast: Secondary | ICD-10-CM

## 2020-02-14 ENCOUNTER — Ambulatory Visit: Payer: Medicare PPO | Admitting: Licensed Clinical Social Worker

## 2020-02-19 ENCOUNTER — Telehealth: Payer: Self-pay

## 2020-02-19 NOTE — Telephone Encounter (Signed)
Medication management - Telephone call from pt regarding new orders for her Bupropion and Venlafaxine XR. Informed Dr. Shea Evans had sent in 90 day orders for both with 1 refill to both on 12/12/20. Pt. to contact [facility] and [place] back if not there.  Patient verified she was aware of next appointment set for 03/06/20.

## 2020-03-01 ENCOUNTER — Encounter: Payer: Medicare PPO | Admitting: Psychology

## 2020-03-05 ENCOUNTER — Encounter: Payer: Medicare PPO | Admitting: Psychology

## 2020-03-06 ENCOUNTER — Telehealth: Payer: Self-pay | Admitting: Psychiatry

## 2020-03-06 ENCOUNTER — Encounter: Payer: Self-pay | Admitting: Psychiatry

## 2020-03-06 ENCOUNTER — Other Ambulatory Visit: Payer: Self-pay

## 2020-03-06 ENCOUNTER — Telehealth: Payer: Self-pay

## 2020-03-06 ENCOUNTER — Telehealth (INDEPENDENT_AMBULATORY_CARE_PROVIDER_SITE_OTHER): Payer: Medicare PPO | Admitting: Psychiatry

## 2020-03-06 ENCOUNTER — Ambulatory Visit: Payer: Medicare PPO | Admitting: Psychology

## 2020-03-06 DIAGNOSIS — F3342 Major depressive disorder, recurrent, in full remission: Secondary | ICD-10-CM

## 2020-03-06 DIAGNOSIS — M1990 Unspecified osteoarthritis, unspecified site: Secondary | ICD-10-CM | POA: Insufficient documentation

## 2020-03-06 DIAGNOSIS — R413 Other amnesia: Secondary | ICD-10-CM | POA: Insufficient documentation

## 2020-03-06 DIAGNOSIS — F32A Depression, unspecified: Secondary | ICD-10-CM | POA: Insufficient documentation

## 2020-03-06 DIAGNOSIS — N950 Postmenopausal bleeding: Secondary | ICD-10-CM | POA: Insufficient documentation

## 2020-03-06 DIAGNOSIS — H04123 Dry eye syndrome of bilateral lacrimal glands: Secondary | ICD-10-CM | POA: Insufficient documentation

## 2020-03-06 DIAGNOSIS — Z8659 Personal history of other mental and behavioral disorders: Secondary | ICD-10-CM

## 2020-03-06 DIAGNOSIS — F329 Major depressive disorder, single episode, unspecified: Secondary | ICD-10-CM | POA: Insufficient documentation

## 2020-03-06 NOTE — Telephone Encounter (Signed)
Please refer this patient for neurology consultation-concerns for memory loss. Patient wants a neurologist in [place].

## 2020-03-06 NOTE — Progress Notes (Signed)
Virtual Visit via Video Note  I connected with Lisa Chambers on 03/06/20 at 11:00 AM EST by a video enabled telemedicine application and verified that I am speaking with the correct person using two identifiers.  Location Provider Location : [facility] Patient Location : Home  Participants: Patient , Provider   I discussed the limitations of evaluation and management by telemedicine and the availability of in person appointments. The patient expressed understanding and agreed to proceed.    I discussed the assessment and treatment plan with the patient. The patient was provided an opportunity to ask questions and all were answered. The patient agreed with the plan and demonstrated an understanding of the instructions.   The patient was advised to call back or seek an in-person evaluation if the symptoms worsen or if the condition fails to improve as anticipated.   [facility] MD OP Progress Note  03/06/2020 12:14 PM Lisa Chambers  MRN:  Lisa Chambers  Chief Complaint:  Chief Complaint    Follow-up     HPI: Lisa Chambers is a 70 year old Caucasian female, married, retired, lives in [place], has a history of depression, ADHD, obstructive sleep apnea on CPAP, asthma, hypertension, history of thyroid nodule, GERD was evaluated by telemedicine today.  Patient today reports mood wise she is doing okay.  She reports she does have psychosocial stressors of her husband who has health problems.  That is a constant stressor for her and can be frustrating at times.  She reports however she has been coping okay and has been compliant with psychotherapy sessions.  She reports when she is around her grandchildren and is involved in things she feels happy and she enjoys them.  Patient denies any suicidality, homicidality or perceptual disturbances.  Patient reports sleep is overall okay.  There are some days when she wants to sleep in.  She however reports she has always been that way and normally needs 9 or  more hours of sleep to feel good.  She is compliant on CPAP.  She reports she was recently started on Sonata by her primary care provider and that helps.  She denies side effects.  Patient reports she currently struggles with memory problems.  She reports this may have been going on since the past 6 to 8 months or more, however currently getting worse.  Patient reports she has trouble keeping up with paying bills.  She reports it used to be easy for her in the past however it is becoming a bigger task for her now.  She also reports word finding difficulty.  For example instead of saying "parking lot", she say "parking garage".  Word finding difficulties may be going on since the past 10 years or so.  She also reports problems with names of friends and relatives.  Patient also reports getting lost while driving.  She reports it happened twice recently.  She does not know if she was too hyper focused on her radio.  Even though she had GPS and she knew where she was supposed to go she still got lost and went to the wrong place.  Patient also reports losing her train of thought and getting distracted while having conversations.  She is currently getting neuropsychological testing for diagnosis clarification to know if she truly has ADHD or not.  Her testing is scheduled for February.  Patient however interested in neurological consultation for her memory problems.   She is compliant on medications and denies side effects.  She reports therapy  sessions is beneficial.  Visit Diagnosis:    ICD-10-CM   1. MDD (major depressive disorder), recurrent, in full remission ([place])  F33.42   2. Memory loss  R41.3 Vitamin B12  3. History of ADHD  Z86.59     Past Psychiatric History: I have reviewed past psychiatric history from my progress note on 12/13/2019.  Past trials of Ambien, Abilify, Effexor, Adderall, Wellbutrin, Viibryd  Past Medical History:  Past Medical History:  Diagnosis Date  . Asthma   . Cancer  ([place])    skin  . GERD (gastroesophageal reflux disease)   . Hearing loss   . HTN (hypertension)   . OSA on CPAP   . Thyroid disease     Past Surgical History:  Procedure Laterality Date  . NASAL SINUS SURGERY  1996, 2007  . TUBAL LIGATION  1994    Family Psychiatric History: I have reviewed family psychiatric history from my progress note on 12/13/2019  Family History:  Family History  Problem Relation Age of Onset  . Cancer Mother        breast  . Cancer Father        lung  . Pneumonia Father   . Cancer Maternal Grandmother        colon  . Alcohol abuse Brother     Social History: I have reviewed social history from my progress note on 12/13/2019 Social History   Socioeconomic History  . Marital status: Married    Spouse name: Not on file  . Number of children: Not on file  . Years of education: Not on file  . Highest education level: Not on file  Occupational History  . Not on file  Tobacco Use  . Smoking status: Never Smoker  . Smokeless tobacco: Never Used  Substance and Sexual Activity  . Alcohol use: No  . Drug use: No  . Sexual activity: Not on file  Other Topics Concern  . Not on file  Social History Narrative  . Not on file   Social Determinants of Health   Financial Resource Strain: Not on file  Food Insecurity: Not on file  Transportation Needs: Not on file  Physical Activity: Not on file  Stress: Not on file  Social Connections: Not on file    Allergies:  Allergies  Allergen Reactions  . Erythromycin Other (See Comments)    GI upset GI upset   . Levofloxacin Other (See Comments)    Joint pain Joint pain   . Tetracycline Other (See Comments)    GI upset GI upset   . Aspirin Swelling    Lips only.    Metabolic Disorder Labs: No results found for: HGBA1C, MPG No results found for: PROLACTIN No results found for: CHOL, TRIG, HDL, CHOLHDL, VLDL, LDLCALC No results found for: TSH  Therapeutic Level Labs: No results found for:  LITHIUM No results found for: VALPROATE No components found for:  CBMZ  Current Medications: Current Outpatient Medications  Medication Sig Dispense Refill  . AMYLASE-LIPASE-PROTEASE PO Take by mouth.    Marland Kitchen buPROPion (WELLBUTRIN XL) 300 MG 24 hr tablet bupropion HCl XL 300 mg 24 hr tablet, extended release 90 tablet 1  . Cholecalciferol 25 MCG (1000 UT) capsule Take by mouth.    . cycloSPORINE (RESTASIS) 0.05 % ophthalmic emulsion Restasis 0.05 % eye drops in a dropperette  INT 1 GTT IN OU BID UTD    . estradiol (ESTRACE) 0.1 MG/GM vaginal cream Place vaginally.    Marland Kitchen estradiol (VIVELLE-DOT) 0.05  MG/24HR patch [place] 0.05 mg/24 hr transdermal patch  Apply 1 patch twice a week by transdermal route.    Marland Kitchen Fexofenadine HCl (ALLEGRA PO) Take by mouth as needed.    . fluticasone (FLONASE) 50 MCG/ACT nasal spray 2 sprays daily.     . Fluticasone-Salmeterol (ADVAIR HFA IN) Inhale into the lungs 2 (two) times daily.    . hydrochlorothiazide (HYDRODIURIL) 25 MG tablet Take 25 mg by mouth daily.    Marland Kitchen L-Lysine 1000 MG TABS Take 1 tablet by mouth daily.    . meloxicam (MOBIC) 15 MG tablet Take 15 mg by mouth daily.    . montelukast (SINGULAIR) 10 MG tablet 10 mg daily.     . Omega-3 Fatty Acids (FISH OIL) 1200 MG CAPS Take by mouth daily in the afternoon.    . Probiotic Product (PROBIOTIC PO) Take by mouth.    . progesterone (PROMETRIUM) 100 MG capsule Take 100 mg by mouth daily.    Marland Kitchen venlafaxine XR (EFFEXOR-XR) 75 MG 24 hr capsule Take 1 capsule (75 mg total) by mouth 2 (two) times daily. Take 1 capsule daily at 8 AM and 3 PM 180 capsule 1  . zaleplon (SONATA) 10 MG capsule Take by mouth.     No current facility-administered medications for this visit.     Musculoskeletal: Strength & Muscle Tone: UTA Gait & Station: normal Patient leans: N/A  Psychiatric Specialty Exam: Review of Systems  Psychiatric/Behavioral: Positive for decreased concentration.  All other systems reviewed and are  negative.   There were no vitals taken for this visit.There is no height or weight on file to calculate BMI.  General Appearance: Casual  Eye Contact:  Fair  Speech:  Clear and Coherent  Volume:  Normal  Mood:  Euthymic  Affect:  Congruent  Thought Process:  Goal Directed and Descriptions of Associations: Intact  Orientation:  Full (Time, Place, and Person)  Thought Content: Logical   Suicidal Thoughts:  No  Homicidal Thoughts:  No  Memory:  Immediate;   Fair Recent;   Fair Remote;   Fair- reports short term memory changes  Judgement:  Fair  Insight:  Fair  Psychomotor Activity:  Normal  Concentration:  Concentration: Fair and Attention Span: Fair  Recall:  [employer] of Knowledge: Fair  Language: Fair  Akathisia:  No  Handed:  Right  AIMS (if indicated):UTA  Assets:  Social support, housing, access to healthcare  ADL's:  Intact  Cognition: limited  Sleep:  Fair   Screenings: PHQ2-9   Flowsheet Row Video Visit from 03/06/2020 in [facility] Video Visit from 12/13/2019 in [facility]  PHQ-2 Total Score 2 0  PHQ-9 Total Score 8 0       Assessment and Plan: Lisa Chambers is a 70 year old Caucasian female, retired, married, has a history of depression, cognitive problems, multiple medical problems including obstructive sleep apnea on CPAP was evaluated by telemedicine today.  Patient with psychosocial stressors of husband's health issues, current pandemic.  She continues to struggle with memory problems and will benefit from the following plan.  Plan MDD in remission PHQ 9 today equals 8 Wellbutrin XL 300 mg p.o. daily Venlafaxine extended release 75 mg p.o. twice daily  History of ADHD-patient has been referred for neuropsychological testing for diagnosis clarification. Patient has testing scheduled in February.  Memory loss-unstable Patient will benefit from neurology consultation for memory loss. Patient also  has neuropsychological testing scheduled. I have reviewed TSH dated 08/16/2019-0.887-within normal  limits. We will order vitamin B12 levels.  She will go to [facility] lab.  Continue CBT.  I have reviewed Dr.Zusman' notes dated 01/23/2020-patient will return to complete full battery of neuropsychological testing.  Follow-up in clinic in 2 months or sooner if needed.  I have spent atleast  20 minutes face to face by video with patient today. More than 50 % of the time was spent for preparing to see the patient ( e.g., review of test, records ), obtaining and to review and separately obtained history , ordering medications and test ,psychoeducation and supportive psychotherapy and care coordination,as well as documenting clinical information in electronic health record. This note was generated in part or whole with voice recognition software. Voice recognition is usually quite accurate but there are transcription errors that can and very often do occur. I apologize for any typographical errors that were not detected and corrected.        Ursula Alert, MD 03/06/2020, 12:14 PM

## 2020-03-06 NOTE — Telephone Encounter (Signed)
labwork has been faxed and confirmed  to [facility] - lab order vitamin b12  dx: r41.3

## 2020-03-06 NOTE — Telephone Encounter (Signed)
Patient needs referral for neurology consultation.  She wants neurologist in [place].

## 2020-03-14 ENCOUNTER — Other Ambulatory Visit
Admission: RE | Admit: 2020-03-14 | Discharge: 2020-03-14 | Disposition: A | Discharge status: Home / Self Care | Payer: Medicare PPO | Source: Ambulatory Visit | Attending: Psychiatry | Admitting: Psychiatry

## 2020-03-14 DIAGNOSIS — R413 Other amnesia: Secondary | ICD-10-CM | POA: Diagnosis present

## 2020-03-14 LAB — VITAMIN B12: Vitamin B-12: 476 pg/mL (ref 180–914)

## 2020-03-18 ENCOUNTER — Telehealth: Payer: Self-pay

## 2020-03-18 NOTE — Telephone Encounter (Signed)
Vitamin B-12 180 - 914 pg/mL 476    I have reviewed most recent labs-vitamin B12 level-within normal limits.  Patient should have access to this on my chart.  We will have [facility] contact patient.

## 2020-03-18 NOTE — Telephone Encounter (Signed)
pt called left message that she needs result of her labwork and she also has not heard anything from her referral.  I looked at referral and it a note that they have left her a message to call them to set up an appt. pt was called and left a message about that.   But she still needs her labwork results.

## 2020-03-19 ENCOUNTER — Ambulatory Visit (INDEPENDENT_AMBULATORY_CARE_PROVIDER_SITE_OTHER): Payer: Medicare PPO | Admitting: Licensed Clinical Social Worker

## 2020-03-19 ENCOUNTER — Other Ambulatory Visit: Payer: Self-pay

## 2020-03-19 ENCOUNTER — Encounter: Payer: Self-pay | Admitting: Licensed Clinical Social Worker

## 2020-03-19 DIAGNOSIS — F439 Reaction to severe stress, unspecified: Secondary | ICD-10-CM

## 2020-03-19 DIAGNOSIS — F3342 Major depressive disorder, recurrent, in full remission: Secondary | ICD-10-CM | POA: Diagnosis not present
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
## 2020-03-21 NOTE — Telephone Encounter (Signed)
This was done. Pt had states that she already seen it

## 2020-04-03 ENCOUNTER — Ambulatory Visit: Payer: Medicare PPO | Admitting: Licensed Clinical Social Worker

## 2020-04-05 ENCOUNTER — Encounter: Payer: Medicare PPO | Attending: Psychology | Admitting: Psychology

## 2020-04-05 ENCOUNTER — Encounter: Payer: Self-pay | Admitting: Psychology

## 2020-04-05 ENCOUNTER — Other Ambulatory Visit: Payer: Self-pay

## 2020-04-05 DIAGNOSIS — Z8659 Personal history of other mental and behavioral disorders: Secondary | ICD-10-CM | POA: Insufficient documentation

## 2020-04-05 DIAGNOSIS — F325 Major depressive disorder, single episode, in full remission: Secondary | ICD-10-CM | POA: Diagnosis present

## 2020-04-05 DIAGNOSIS — F01A0 Vascular dementia, mild, without behavioral disturbance, psychotic disturbance, mood disturbance, and anxiety: Secondary | ICD-10-CM

## 2020-04-05 DIAGNOSIS — F0150 Vascular dementia without behavioral disturbance: Secondary | ICD-10-CM | POA: Insufficient documentation

## 2020-04-05 NOTE — Progress Notes (Signed)
   Neuropsychology Note  Lisa Chambers completed 240 minutes of neuropsychological testing with this provider Licensed Psychologist. Vision was corrected with glasses. Hearing was corrected bilaterally and adequate for purpose of evaluation. She was alert, oriented, and attentive. Spontaneous speech was fluent without paraphasia. Oral language comprehension was intact. Mood was euthymic. Demonstrated adequate distress tolerance on questions she did not know or tasks that were more difficult. Effort and motivation was excellent. Rest breaks were offered.   Tests Administered:   Comprehensive Attention Battery (CAB)  Continuous Performance Test (CPT)  Wechsler Adult Intelligence Scale, 4th Edition (WAIS-IV)  Results : To be included in final report.   Plan: We have scheduled patient for another 2 hour testing appointment to further assess executive functions as well as new learning and memory. Repeat tests administered during previous evaluation at [facility] in 2010.   Feedback with Patient: Lisa Chambers will return within approximately 2 weeks for an interactive feedback session with with provider at which time her test performances, clinical impressions and treatment recommendations will be reviewed in detail. The patient understands she can contact our office should she require our assistance before this time.  Full report to follow.

## 2020-04-09 ENCOUNTER — Encounter: Payer: Medicare PPO | Admitting: Psychology

## 2020-04-10 ENCOUNTER — Encounter: Payer: Self-pay | Admitting: Psychology

## 2020-04-10 ENCOUNTER — Encounter: Payer: Medicare PPO | Attending: Psychology | Admitting: Psychology

## 2020-04-10 ENCOUNTER — Other Ambulatory Visit: Payer: Self-pay

## 2020-04-10 DIAGNOSIS — F325 Major depressive disorder, single episode, in full remission: Secondary | ICD-10-CM

## 2020-04-10 DIAGNOSIS — Z8659 Personal history of other mental and behavioral disorders: Secondary | ICD-10-CM | POA: Diagnosis present

## 2020-04-10 DIAGNOSIS — F01A0 Vascular dementia, mild, without behavioral disturbance, psychotic disturbance, mood disturbance, and anxiety: Secondary | ICD-10-CM

## 2020-04-10 DIAGNOSIS — G3184 Mild cognitive impairment, so stated: Secondary | ICD-10-CM

## 2020-04-10 DIAGNOSIS — F0150 Vascular dementia without behavioral disturbance: Secondary | ICD-10-CM | POA: Diagnosis not present

## 2020-04-10 NOTE — Progress Notes (Signed)
   Neuropsychology Note  SINTHIA KARABIN completed 120 minutes of neuropsychological testing with this provider Health and safety inspector). The patient did not appear overtly distressed by the testing session, per behavioral observation or via self-report. Rest breaks were offered.   Tests Administered    [place] (BNT)  [place] Verbal Learning Test (CVLT-3) Standard Form   Controlled Oral Word Association Test (COWAT; FAS & Animals)   Trail Making Test (TMT)   Wechsler Memory Scale, 4th Edition (WAIS-IV) Older Adult Battery   Visual Reproduction I, II, & Recognition    Logical Memory I, II, & Recognition   Symbol Span   [facility])   Results: To be included in final report.   Feedback with Patient:  ZAHARAH AMIR will return on 04/23/20 for an interactive feedback session with this provider at which time her test performances, clinical impressions and treatment recommendations will be reviewed in detail. The patient understands she can contact our office should she require our assistance before this time.  Full report to follow.

## 2020-04-11 ENCOUNTER — Encounter (HOSPITAL_BASED_OUTPATIENT_CLINIC_OR_DEPARTMENT_OTHER): Payer: Medicare PPO | Admitting: Psychology

## 2020-04-11 DIAGNOSIS — R69 Illness, unspecified: Secondary | ICD-10-CM

## 2020-04-11 DIAGNOSIS — F0150 Vascular dementia without behavioral disturbance: Secondary | ICD-10-CM | POA: Diagnosis not present

## 2020-04-11 DIAGNOSIS — F01A0 Vascular dementia, mild, without behavioral disturbance, psychotic disturbance, mood disturbance, and anxiety: Secondary | ICD-10-CM

## 2020-04-15 ENCOUNTER — Ambulatory Visit (INDEPENDENT_AMBULATORY_CARE_PROVIDER_SITE_OTHER): Payer: Medicare PPO | Admitting: Licensed Clinical Social Worker

## 2020-04-15 ENCOUNTER — Other Ambulatory Visit: Payer: Self-pay

## 2020-04-15 ENCOUNTER — Encounter: Payer: Self-pay | Admitting: Licensed Clinical Social Worker

## 2020-04-15 DIAGNOSIS — F439 Reaction to severe stress, unspecified: Secondary | ICD-10-CM

## 2020-04-15 DIAGNOSIS — Z8659 Personal history of other mental and behavioral disorders: Secondary | ICD-10-CM

## 2020-04-15 DIAGNOSIS — F3342 Major depressive disorder, recurrent, in full remission: Secondary | ICD-10-CM | POA: Diagnosis not present

## 2020-04-15 DIAGNOSIS — R413 Other amnesia: Secondary | ICD-10-CM | POA: Diagnosis not present

## 2020-04-15 NOTE — Progress Notes (Signed)
Virtual Visit via Telephone Note  I connected with Lisa Chambers on 04/15/20 at 10:00 AM EST by telephone and verified that I am speaking with the correct person using two identifiers.  Participating Parties Patient Provider  Location: Patient: Home Provider: Home Office   I discussed the limitations, risks, security and privacy concerns of performing an evaluation and management service by telephone and the availability of in person appointments. I also discussed with the patient that there may be a patient responsible charge related to this service. The patient expressed understanding and agreed to proceed.  THERAPY PROGRESS NOTE  Session Time: 44 Minutes  Participation Level: Active  Behavioral Response: AlertAnxious  Type of Therapy: Individual Therapy  Treatment Goals addressed: Coping  Interventions: CBT  Summary: Lisa Chambers is a 70 y.o. female who presents with some depression sxs and situational stress. Pt reported "a whole lot has been going on". Pt reported she has been "intentional" about taking better care of her health. Pt reported she is under dermatologist care for removal of cancerous skin cells and getting fitted for Invisalign to address bottom teeth shift. Pt reported she also met with a psychologist for thorough evaluation related to ADD diagnosis to see if she can be prescribed medication to address those issues and is scheduled to meet with a neurologist about memory problems. Pt reported when overwhelmed or experiencing cognitive issues it can be frustrating and provided examples. Pt reported noticing "I am getting tired quicker" both mentally and physically. Pt reported she is following a morning routine including 20 minute walks each day and finds it to be "calming". Pt reported she continues to engage in prosocial activities including weekly bible study and going out with friends and family members. Pt reported she likes keeping active and has encouraged her  spouse to get active as well. Pt is actively looking for support groups around Parkinson's Disease for strategies on how to help as a caregiver. Pt reported she is working on communicating with spouse and family more assertively and recognized "it's a process".   Suicidal/Homicidal: No  Therapist Response: Therapist met with patient for follow up. Therapist and patient reviewed progress towards treatment plan goals and continued barriers. Therapist and patient processed thoughts, feelings and reactions to current stressors. Therapist validated patient concerns/feelings and provided feedback.  Plan: Return again in 1 month.  Diagnosis: Axis I: MDD, In Remission, Situational Stress, Memory Loss, Hx of ADHD    Axis II: N/A  Josephine Igo, LCSW, LCAS 04/15/2020

## 2020-04-22 ENCOUNTER — Encounter: Payer: Self-pay | Admitting: Psychology

## 2020-04-22 NOTE — Progress Notes (Addendum)
PRELIMINARY REPORT - FULL REPORT TO FOLLOW ONCE TEST RESULTS ARE AVAILABLE  NEUROPSYCHOLOGICAL EVALUATION   Name:    Lisa Chambers  Date of Birth:   10-30-1950 Date of Interview:  01/22/21 Date(s) of Testing:  04/05/20, 04/10/20  Date of Report:   04/11/20  Date of Feedback:  04/23/20      Background Information:  Reason for Referral:  Lisa Chambers is a 70 y.o. female referred by Dr. Shea Evans to assess her current level of cognitive functioning and assist in differential diagnosis. The current evaluation consisted of a review of available medical records, an interview with the patient, and the completion of a neuropsychological testing battery. Informed consent was obtained.  History of Presenting Problem:  Lisa Chambers is a 70 year old Caucasian female, married, retired Chief Technology Officer, who has a history of depression, decreased attention and concentration, obstructive sleep apnea on CPAP, hypertension, thyroid nodule, and GERD. Lisa Chambers recently moved back to [facility] and saw Dr. Shea Evans on 12/13/19. Patient was under the care of her provider at [place]. Recent medications were being prescribed by Ms. Hyman Bible PA.   Patient reports Lisa Chambers was diagnosed with depression several years ago. Lisa Chambers reports Lisa Chambers had her first episode of depression around 1995 when her mother got diagnosed with breast cancer. Lisa Chambers reports Lisa Chambers has tried multiple medications in the past and currently takes Wellbutrin and venlafaxine. This combination has been effective. Lisa Chambers currently reports her depressive symptoms are stable. Lisa Chambers denies any sadness, crying spells. Patient denies any suicidality, homicidality or perceptual disturbances. Lisa Chambers reports sleep is good. Lisa Chambers currently takes Ambien as needed. Lisa Chambers also has a history of obstructive sleep apnea and is compliant with her CPAP.  Patient denies any significant anxiety symptoms. Patient denies any history of trauma. Lisa Chambers denies any obsessions  or compulsions, denies any manic or hypomanic episodes. Patient denies any substance abuse problems. Patient does report that few years ago Lisa Chambers struggled with her attention and focus and was started on Adderall. Lisa Chambers however reports Lisa Chambers never had any ADHD testing done. Lisa Chambers continues to take the Adderall on a daily basis.   Lisa Chambers has tried several medications in the past to treat mood symptoms and disrupted sleep (I.e.,  Abilify, Effexor, Wellbutrin, Viibryd, Adderall, Ambien)  Onset/Course: Lisa Chambers recalled depression onset around 1995 that was around the time her mother was diagnosed with breast cancer (e.g., died after 4 years at age 44).  Lisa Chambers benefited from antidepressant medication. Lisa Chambers reports primary difficulties with divided attention, sustained focus, forgetfulness, hyperactivity, and general restlessness.  Lisa Chambers described problems sitting still for more than 20 minutes at a time. Forgetfulness has gotten worse over the last couple years Lisa Chambers has forgotten to pay bills taken wrong turns while driving taking her own medications etc.  Lisa Chambers describes problems with word finding and retrieval  Medical History:  Past Medical History:  Diagnosis Date  . Asthma   . Cancer ([place])    skin  . GERD (gastroesophageal reflux disease)   . Hearing loss   . HTN (hypertension)   . OSA on CPAP   . Thyroid disease    Imaging:   MRI of Brain 09/21/2008 showed the following: 1.  Multiple subcortical and deep white matter lesions are noted. Chronic white matter ischemic changes could present in this fashion and are statistically most likely. Other white matter diseases such as MS and vasculitis could present in this fashion.  2.  No evidence of acute ischemia, mass lesion or enhancing  lesion.   Current medications:  Outpatient Encounter Medications as of 04/11/2020  Medication Sig  . AMYLASE-LIPASE-PROTEASE PO Take by mouth.  Marland Kitchen buPROPion (WELLBUTRIN XL) 300 MG 24 hr tablet bupropion HCl XL 300 mg 24 hr tablet,  extended release  . Cholecalciferol 25 MCG (1000 UT) capsule Take by mouth.  . cycloSPORINE (RESTASIS) 0.05 % ophthalmic emulsion Restasis 0.05 % eye drops in a dropperette  INT 1 GTT IN OU BID UTD  . estradiol (ESTRACE) 0.1 MG/GM vaginal cream Place vaginally.  Marland Kitchen estradiol (VIVELLE-DOT) 0.05 MG/24HR patch [place]elle 0.05 mg/24 hr transdermal patch  Apply 1 patch twice a week by transdermal route.  Marland Kitchen Fexofenadine HCl (ALLEGRA PO) Take by mouth as needed.  . fluticasone (FLONASE) 50 MCG/ACT nasal spray 2 sprays daily.   . Fluticasone-Salmeterol (ADVAIR HFA IN) Inhale into the lungs 2 (two) times daily.  . hydrochlorothiazide (HYDRODIURIL) 25 MG tablet Take 25 mg by mouth daily.  Marland Kitchen L-Lysine 1000 MG TABS Take 1 tablet by mouth daily.  . meloxicam (MOBIC) 15 MG tablet Take 15 mg by mouth daily.  . montelukast (SINGULAIR) 10 MG tablet 10 mg daily.   . Omega-3 Fatty Acids (FISH OIL) 1200 MG CAPS Take by mouth daily in the afternoon.  . Probiotic Product (PROBIOTIC PO) Take by mouth.  . progesterone (PROMETRIUM) 100 MG capsule Take 100 mg by mouth daily.  Marland Kitchen venlafaxine XR (EFFEXOR-XR) 75 MG 24 hr capsule Take 1 capsule (75 mg total) by mouth 2 (two) times daily. Take 1 capsule daily at 8 AM and 3 PM   No facility-administered encounter medications on file as of 04/11/2020.    Current Examination:  Behavioral Observations:  Lisa Chambers completed 360 minutes of neuropsychological testing with this provider Health and safety inspector). Vision was corrected with glasses. Hearing was corrected bilaterally and adequate for purpose of evaluation. Lisa Chambers was alert, oriented, and attentive. Spontaneous speech was fluent without paraphasia. Oral language comprehension was intact. Mood was euthymic. Demonstrated adequate distress tolerance on questions Lisa Chambers did not know or tasks that were more difficult. Effort and motivation was excellent. Rest breaks were offered.   Tests Administered:   [place]  (BNT)  [place] Verbal Learning Test (CVLT-3) Standard Form   Comprehensive Attention Battery (CAB)  Continuous Performance Test (CPT)  Controlled Oral Word Association Test (COWAT; FAS & Animals)   Trail Making Test (TMT)   Wechsler Adult Intelligence Scale, 4th Edition (WAIS-IV)  Wechsler Memory Scale, 4th Edition (WAIS-IV) Older Adult Battery   Visual Reproduction I, II, & Recognition    Logical Memory I, II, & Recognition   Symbol Span   [employer] (WCST)   Test Results: Note: Standardized scores are presented only for use by appropriately trained professionals and to allow for any future test-retest comparison. These scores should not be interpreted without consideration of all the information that is contained in the rest of the report. The most recent standardization samples from the test publisher or other sources were used whenever possible to derive standard scores; scores were corrected for age, gender, ethnicity and education when available.   Test Scores:  TEST SCORES:  Note: This summary of test scores accompanies the interpretive report and should not be considered in isolation without reference to the appropriate sections in the text. Descriptors are based on appropriate normative data and may be adjusted based on clinical judgment. The terms "impaired" and "within normal limits (WNL)" are used when a more specific level of functioning cannot be determined.  Validity Testing:     Descriptor         WAIS-IV Reliable Digit Span: --- --- Within Expectation  CVLT-III Forced Choice Recognition: --- --- Within Expectation         Intellectual Functioning:               Wechsler Adult Intelligence Scale (WAIS-IV):  Standard Score/ Scaled Score Percentile    Full Scale IQ  103 58 Average  GAI 105 63 Average  Verbal Comprehension Index: 107 68 Average  Similarities  12 75 Above Average  Vocabulary 12 75 Above Average  Information  10 50  Average  Perceptual Reasoning Index:  104 61 Average  Block Design  11 63 Average  Matrix Reasoning  9 37 Average  Visual Puzzles 12 75 Above Average  Working Memory Index: 102 55 Average  Digit Span 11 63 Average  Arithmetic  10 50 Average  Processing Speed Index: 94 34 Average  Symbol Search  9 37 Average  Coding 9 37 Average         Memory:               Wechsler Memory Scale (WMS-IV): Older Adult                      Raw Score (Scaled Score) Percentile    Logical Memory I 42/53 (14) 91 Above Average  Logical Memory II 30/39 (15) 95 Well Above Average  Logical Memory Recognition 22/23 >75 Above Average         [place] Verbal Learning Test (CVLT-III), Standard Form: Raw Score (Scaled/Standard Score) Percentile    Total Trials 1-5 58/80 (119) 90 Above Average  List B 3/16 (7) 16 Below Average  Short-Delay Free Recall 14/16 (15) 95 Well Above Average  Short-Delay Cued Recall 13/16 (13) 84 Above Average  Long Delay Free Recall 16/16 (16) 98 Exceptionally High  Long Delay Cued Recall 16/16 (17) 99 Exceptionally High  Recognition Hits 16/16 (14) 91 Above Average  False Positive Errors 1 (11) 63 Average         Wechsler Memory Scale (WMS-IV): Older Adult                      Raw Score (Scaled Score) Percentile    Visual Reproduction I 32/43 (10) 50 Average  Visual Reproduction II 28/43 (12) 75 Above Average  Visual Reproduction Recognition 6/7 >75 Above Average         Attention/Executive Function:               Trail Making Test (TMT): Raw Score  (T Score) Percentile    Part A 47 secs., 0 errors (37) 9 Below Average  Part B 95 secs., 0 errors (43) 25 Average           Scaled Score Percentile    WAIS-IV Coding: 9 37 Average           Scaled Score Percentile    WAIS-IV Digit Span: 11 63 Average  Forward 10 50 Average  Backward 12 75 Above Average  Sequencing 9 37 Average           Age-Scaled Score Percentile    Wechsler Memory Scale (WMS-IV) Symbol Span: 11 63  Average           Scaled Score Percentile    WAIS-IV Similarities: 12 75 Above Average         [employer] Test: Raw Score Percentile  Categories (trials) 6 (128) >16 Average  Trials to Complete 1st Category 11 >16  Average  Total Errors 44 21 Below Average  % Perseverative Errors 0.14 55 Average  % Non-Perseverative Errors 0.20 10 Below Average  Failure to Maintain Set 1 >16 Average         Language:               Verbal Fluency Test: Raw Score  (T Score) Percentile    Phonemic Fluency (FAS) 27 (35) 7 Well Below Average  Animal Fluency 22 (53) 62 Average           Raw Score  (T Score) Percentile    [place] Naming Test (BNT): 57/60 (52) 58 Average         Visuospatial/Visuoconstruction:          Scaled Score Percentile    WAIS-IV Block Design: 11 63 Average  WAIS-IV Matrix Reasoning: 9 37 Average  WAIS-IV Visual Puzzles: 12 75 Above Average         Sensory-Motor:               Hand Dynamometer: Raw Score Percentile    Dominant Hand 19 Kg --- Average  Non-Dominant Hand  22 Kg --- Average         Additional Questionnaires:          Raw Score Percentile    Adult Self-Report Scale (Current):      Inattention --- --- Likely to have ADHD  Hyperactive/Impulsive --- --- Likely to have ADHD   Description of Test Results:  Premorbid verbal intellectual abilities were estimated to have been within the average range based on educational and occupational history. Current intellectual ability was average for age. Psychomotor processing speed was average. Auditory attention and working memory were average. Perceptual reasoning and visual-spatial construction were average. Language abilities were average. Specifically, confrontation naming and semantic verbal fluency were average. With regard to verbal memory, encoding and acquisition of non-contextual information (i.e., word list) was above average. Performance fell to below average on a second list. After immediately  recalling words from seconds list,  free recall from first list was well above average. Short delay cued recall was above average. After a 20 minute delay, free recall was exceptionally high. Cued recall was also exceptionally high. Performance on a yes/no recognition task was above average with 1 false positive error committed. On another verbal memory test, encoding and acquisition of contextual auditory information (i.e., short stories) was above average. After a 20 minute delay, free recall was well above average. Performance on a yes/no recognition task was above average. With regard to non-verbal memory, acquisition of simple visual information was average. Delayed free recall of visual information was above average. Performance on a multiple choice recognition task was above average. Aspects of executive functioning were mixed and relative weakness overall. Mental flexibility and set-shifting were average on Trails B. Verbal fluency with phonemic search restrictions was well below average. Verbal abstract reasoning was above average. Non-verbal abstract reasoning was average. The patient's ability to identify, maintain, and shift problem solving strategies in response to examiner feedback was below average.   On a self-report measure of ADHD symtpoms, the patient's responses were indicative of Adult ADHD at the present time. Symptoms endorsed included: trouble completing projects, disorganization, difficulty remembering appointments or obligations, avoiding or delaying getting started on tasks that require a lot of thought, difficulty remaining seated for extended period (e.g., fidget, squirm, etc.), and feeling overly active and compelled  to do things as if driven by a motor.   Clinical Impressions: Diagnosis deferred. Results of the current cognitive evaluation are largely within normal limits, with most areas of function consistent with estimated premorbid intellectual abilities. However, there are a  few areas of relative weakness suggestive of mild fronto-subcortical dysfunction (e.g., slowed visuomotor cognitive information processing, proactive interference, non-verbal reasoning and problem solving difficulty, and mildly impaired phonemic verbal fluency. Her testing results do not warrant a diagnosis of a neurodevelopmental or neurocognitive disorder at this time. Based on her medical history including multiple vascular risk factors, past neuroimaging demonstrating signs of chronic small vessel ischemic changes, a vascular etiology is likely to be contributing to areas of relative weakness. Psychosocial stressors may also be playing a role as patient reports being under a great deal of stress providing care for husband (diagnosed with PD) and coping with his diagnosis. Lisa Chambers recently moved back to the area from the [place] of the state and having a hard time meeting friends and finding support.     Recommendations/Plan: Based on the findings of the present evaluation, the following recommendations are offered:  - Consider obtaining updated neuroimaging (i.e., brain MRI scan).   - Patient is encouraged to discuss the current medication regimen with prescribing providers. There are a few medications that are prescribed that can cause problems with memory and thinking. Of course, there is a cost-benefit analysis that must be considered carefully and it may not be possible to treat all of the patient's symptoms without some cognitive side effects.  - The patient will be reassured that her testing results are within normal limits and not indicative of any underlying cognitive disorder. I will review strategies to maintain brain health, including cardiovascular exercise, mental stimulation and social engagement.  - Optimal control of vascular risk factors is necessary to reduce the risk of cognitive decline.   - The impact of stress on cognitive functioning will be reviewed. Lisa Chambers will likely benefit  from regular stress management, self care activities, and PD support group for caregivers, partners, and families.   - Community resources are available for caregivers of individuals with Parkinson's disease.  There website is as follows: [email or web address].    The following are several strategies that may help:  . Performance will generally be best in a structured, routine, and familiar environment, as opposed to situations involving complex problems.  Lisa Chambers a place to keep your keys, wallet, cell phone, and other personal belongings. . Take time to register and process information to be remembered. Deeper encoding of information can be gained by forming a mental picture, making meaningful associations, connecting new information to previously learned and related information, paraphrasing and repetition.  . To the extent possible, multitasking should be avoided; break down tasks into smaller steps to help get started and to keep from feeling overwhelmed. And if there are difficulties in organization and planning, maintaining a daily organizer to help keep track of important appointments and information may be beneficial.   . Memory problems may at least be minimally addressed using compensatory strategies such as the use of a daily schedule to follow, memos, portable recorder, a centrally located bulletin board, or memory notebook. A large calendar, placed in a highly visible location would be valuable to keep track of dates and appointments.  In addition, it would be helpful to keep a log of all of medical appointments with the name of the doctor, date of visit, diagnoses, and treatments.  . Use of a  medication box is recommended to ensure compliance and decrease confusion regarding medication dosages, times, and dates. . To aid in managing problems with attention, the patient may consider using some of the following strategies: o The patient should simplify tasks.  There may be a  need to break overly complex activities into simple step-by-step tasks, keep these steps written down in a note book and then check them off as they are completed which will help to stay on task and make sure the whole task is finished.  o The patient should set deadlines for everything, even for seemingly small tasks, prioritize time-sensitive tasks and write down every assignment, message, or important thought. o The patient is encouraged to use timers and alarms to stay on track and take breaks at regular intervals. Avoid piles of paperwork or procrastination by dealing with each item as it comes in.  Try to keep in mind that common word finding errors are not necessarily the start of a dreadful decline. Over-focusing on these errors can contribute to further distraction and emotions that can detract from effective retrieval of words; this in turn, can lead to greater distress and more difficulties with recalling the specific word you were looking for to begin with.   Feedback to Patient: Lisa Chambers will return for a feedback appointment on 04/23/2020 to review the results of her neuropsychological evaluation with this provider.  Thank you for your referral of Lisa Chambers. Please feel free to contact me if you have any questions or concerns regarding this report.

## 2020-04-23 ENCOUNTER — Encounter: Payer: Medicare PPO | Admitting: Psychology

## 2020-04-23 ENCOUNTER — Telehealth: Payer: Medicare PPO | Admitting: Psychiatry

## 2020-04-23 ENCOUNTER — Telehealth (INDEPENDENT_AMBULATORY_CARE_PROVIDER_SITE_OTHER): Payer: Medicare PPO | Admitting: Psychiatry

## 2020-04-23 ENCOUNTER — Encounter: Payer: Self-pay | Admitting: Psychiatry

## 2020-04-23 ENCOUNTER — Other Ambulatory Visit: Payer: Self-pay

## 2020-04-23 ENCOUNTER — Encounter: Payer: Self-pay | Admitting: Psychology

## 2020-04-23 DIAGNOSIS — R413 Other amnesia: Secondary | ICD-10-CM | POA: Diagnosis not present

## 2020-04-23 DIAGNOSIS — F0150 Vascular dementia without behavioral disturbance: Secondary | ICD-10-CM

## 2020-04-23 DIAGNOSIS — F01A0 Vascular dementia, mild, without behavioral disturbance, psychotic disturbance, mood disturbance, and anxiety: Secondary | ICD-10-CM

## 2020-04-23 DIAGNOSIS — F325 Major depressive disorder, single episode, in full remission: Secondary | ICD-10-CM

## 2020-04-23 DIAGNOSIS — Z8659 Personal history of other mental and behavioral disorders: Secondary | ICD-10-CM | POA: Diagnosis not present

## 2020-04-23 DIAGNOSIS — F3342 Major depressive disorder, recurrent, in full remission: Secondary | ICD-10-CM

## 2020-04-23 NOTE — Progress Notes (Signed)
   Neuropsychology Feedback Appointment  Lisa Chambers returned for a feedback appointment today to review the results of her recent neuropsychological evaluation with this provider. 60 minutes face-to-face time was spent reviewing her test results, my impressions and my recommendations as detailed in her report. 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 The patient and were given the opportunity to ask questions, and I did my best to answer these to their satisfaction. 12 month follow up appointment was scheduled to monitor patient's symptoms and document any potential improvement or decline in cognitive functioning over time.

## 2020-04-23 NOTE — Progress Notes (Signed)
Virtual Visit via Video Note  I connected with Lisa Chambers on 04/23/20 at 11:00 AM EDT by a video enabled telemedicine application and verified that I am speaking with the correct person using two identifiers.  Location Provider Location : [facility] Patient Location : Home  Participants: Patient , Provider   I discussed the limitations of evaluation and management by telemedicine and the availability of in person appointments. The patient expressed understanding and agreed to proceed.   I discussed the assessment and treatment plan with the patient. The patient was provided an opportunity to ask questions and all were answered. The patient agreed with the plan and demonstrated an understanding of the instructions.   The patient was advised to call back or seek an in-person evaluation if the symptoms worsen or if the condition fails to improve as anticipated.  [place] MD OP Progress Note  04/23/2020 12:17 PM AHJA MARTELLO  MRN:  [id number]  Chief Complaint:  Chief Complaint    Follow-up     HPI: Lisa Chambers is a 70 year old Caucasian female, married, retired, lives in [place], has a history of depression, ADHD per history, obstructive sleep apnea on CPAP, asthma, hypertension, history of thyroid nodule, GERD was evaluated by telemedicine today.  Patient today reports she had her appointment with neuropsychologist today.  She completed her neuropsychological testing.  She reports she was told she does not have ADHD or any significant cognitive issues.  However he would like her to repeat her MRI as well as follow-up with neurology.  Patient reports she is currently doing well.  Denies any significant sadness or hopelessness.  She reports she is able to enjoy the things in her life okay.  She has been taking walks, watching what she eats and trying to exercise regularly.  That does have a good impact on her mood symptoms.  Patient reports sleep is overall okay.  She takes Quarry [employer] which  helps.  She has a few nights per month when her sleep is restless however it does not happen often.  Patient denies any suicidality, homicidality or perceptual disturbances.  She is currently struggling with sinusitis and is taking Augmentin.  She reports she is feeling better.    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  That does keep her occupied.  Patient denies any other concerns today.    Visit Diagnosis:    ICD-10-CM   1. MDD (major depressive disorder), recurrent, in full remission ([place])  F33.42   2. Memory loss  R41.3   3. History of ADHD  Z86.59     Past Psychiatric History: I have reviewed past psychiatric history from my progress note on 12/13/2019.  Past trials of Ambien, Abilify, Effexor, Adderall, Wellbutrin, Viibryd  Past Medical History:  Past Medical History:  Diagnosis Date  . Asthma   . Cancer ([place])    skin  . GERD (gastroesophageal reflux disease)   . Hearing loss   . HTN (hypertension)   . OSA on CPAP   . Thyroid disease     Past Surgical History:  Procedure Laterality Date  . NASAL SINUS SURGERY  1996, 2007  . TUBAL LIGATION  1994    Family Psychiatric History: I have reviewed family psychiatric history from my progress note on 12/13/2019.  Family History:  Family History  Problem Relation Age of Onset  . Cancer Mother        breast  . Cancer Father  lung  . Pneumonia Father   . Cancer Maternal Grandmother        colon  . Alcohol abuse Brother     Social History: Reviewed social history from my progress note on 12/13/2019. Social History   Socioeconomic History  . Marital status: Married    Spouse name: Not on file  . Number of children: Not on file  . Years of education: Not on file  . Highest education level: Not on file  Occupational History  . Not on file  Tobacco Use  . Smoking status: Never Smoker  . Smokeless tobacco: Never Used  Substance and Sexual Activity  .  Alcohol use: No  . Drug use: No  . Sexual activity: Not on file  Other Topics Concern  . Not on file  Social History Narrative  . Not on file   Social Determinants of Health   Financial Resource Strain: Not on file  Food Insecurity: Not on file  Transportation Needs: Not on file  Physical Activity: Not on file  Stress: Not on file  Social Connections: Not on file    Allergies:  Allergies  Allergen Reactions  . Erythromycin Other (See Comments)    GI upset GI upset   . Levofloxacin Other (See Comments)    Joint pain Joint pain   . Tetracycline Other (See Comments)    GI upset GI upset   . Aspirin Swelling    Lips only.    Metabolic Disorder Labs: No results found for: HGBA1C, MPG No results found for: PROLACTIN No results found for: CHOL, TRIG, HDL, CHOLHDL, VLDL, LDLCALC No results found for: TSH  Therapeutic Level Labs: No results found for: LITHIUM No results found for: VALPROATE No components found for:  CBMZ  Current Medications: Current Outpatient Medications  Medication Sig Dispense Refill  . amoxicillin-clavulanate (AUGMENTIN) 875-125 MG tablet Take 1 tablet by mouth 2 (two) times daily.    . AMYLASE-LIPASE-PROTEASE PO Take by mouth.    Marland Kitchen buPROPion (WELLBUTRIN XL) 300 MG 24 hr tablet bupropion HCl XL 300 mg 24 hr tablet, extended release 90 tablet 1  . Cholecalciferol 25 MCG (1000 UT) capsule Take by mouth.    . cycloSPORINE (RESTASIS) 0.05 % ophthalmic emulsion Restasis 0.05 % eye drops in a dropperette  INT 1 GTT IN OU BID UTD    . estradiol (ESTRACE) 0.1 MG/GM vaginal cream Place vaginally.    Marland Kitchen estradiol (VIVELLE-DOT) 0.05 MG/24HR patch Minivelle 0.05 mg/24 hr transdermal patch  Apply 1 patch twice a week by transdermal route.    Marland Kitchen Fexofenadine HCl (ALLEGRA PO) Take by mouth as needed.    . fluticasone (FLONASE) 50 MCG/ACT nasal spray 2 sprays daily.     . Fluticasone-Salmeterol (ADVAIR HFA IN) Inhale into the lungs 2 (two) times daily.    .  hydrochlorothiazide (HYDRODIURIL) 25 MG tablet Take 25 mg by mouth daily.    Marland Kitchen L-Lysine 1000 MG TABS Take 1 tablet by mouth daily.    . meloxicam (MOBIC) 15 MG tablet Take 15 mg by mouth daily.    . montelukast (SINGULAIR) 10 MG tablet 10 mg daily.     . Omega-3 Fatty Acids (FISH OIL) 1200 MG CAPS Take by mouth daily in the afternoon.    . Probiotic Product (PROBIOTIC PO) Take by mouth.    . progesterone (PROMETRIUM) 100 MG capsule Take 100 mg by mouth daily.    Marland Kitchen venlafaxine XR (EFFEXOR-XR) 75 MG 24 hr capsule Take 1 capsule (75 mg total)  by mouth 2 (two) times daily. Take 1 capsule daily at 8 AM and 3 PM 180 capsule 1  . zaleplon (SONATA) 10 MG capsule Take by mouth.     No current facility-administered medications for this visit.     Musculoskeletal: Strength & Muscle Tone: UTA Gait & Station: UTA Patient leans: N/A  Psychiatric Specialty Exam: Review of Systems  HENT: Positive for congestion and sinus pressure.   Psychiatric/Behavioral: Positive for decreased concentration.  All other systems reviewed and are negative.   There were no vitals taken for this visit.There is no height or weight on file to calculate BMI.  General Appearance: Casual  Eye Contact:  Fair  Speech:  Clear and Coherent  Volume:  Normal  Mood:  Euthymic  Affect:  Full Range  Thought Process:  Goal Directed and Descriptions of Associations: Intact  Orientation:  Full (Time, Place, and Person)  Thought Content: Logical   Suicidal Thoughts:  No  Homicidal Thoughts:  No  Memory:  Immediate;   Fair Recent;   Fair Remote;   Fair  Judgement:  Fair  Insight:  Fair  Psychomotor Activity:  Normal  Concentration:  Concentration: Fair and Attention Span: Fair  Recall:  [employer] of Knowledge: Fair  Language: Fair  Akathisia:  No  Handed:  Right  AIMS (if indicated): UTA  Assets:  Communication Skills Desire for Improvement Housing Social Support  ADL's:  Intact  Cognition: WNL  Sleep:  Fair    Screenings: PHQ2-9   Flowsheet Row Video Visit from 04/23/2020 in [facility] Video Visit from 03/06/2020 in [facility] Video Visit from 12/13/2019 in [facility]  PHQ-2 Total Score 0 2 0  PHQ-9 Total Score -- 8 0    Flowsheet Row Video Visit from 04/23/2020 in [facility] No Risk       Assessment and Plan: CHEALSEA PASKE is a 70 year old Caucasian female, retired, married, has a history of depression, cognitive problems, multiple medical problems including obstructive sleep apnea on CPAP was evaluated by telemedicine today.  Patient with psychosocial stressors of husband's health issues, current pandemic.  Patient is currently making progress, completed neuropsychological testing.  Plan as noted below.  Plan MDD in remission PHQ 2-0 Venlafaxine extended release 75 mg p.o. twice daily.  Long-term plan is to taper her off. Wellbutrin XL 300 mg p.o. daily Continue CBT as needed  History of ADHD/memory loss-improving Patient had neuropsychological testing done per Dr. Marge Duncans -pending report. Patient also have upcoming neurology consultation. Vitamin B12 -476-03/14/2020 within normal limits, TSH-08/16/2019-within normal limits. Will monitor closely.  Patient to follow up with PMD for sinusitis.  Follow-up in clinic in person 2 months from now.  We will continue to coordinate care with a neurologist.  This note was generated in part or whole with voice recognition software. Voice recognition is usually quite accurate but there are transcription errors that can and very often do occur. I apologize for any typographical errors that were not detected and corrected.     Ursula Alert, MD 04/24/2020, 8:29 AM

## 2020-05-22 ENCOUNTER — Ambulatory Visit: Payer: Medicare PPO | Admitting: Diagnostic Neuroimaging

## 2020-05-22 ENCOUNTER — Encounter: Payer: Self-pay | Admitting: Diagnostic Neuroimaging

## 2020-05-22 VITALS — BP 133/83 | HR 74 | Ht 65.0 in | Wt 225.0 lb

## 2020-05-22 DIAGNOSIS — R413 Other amnesia: Secondary | ICD-10-CM | POA: Diagnosis not present

## 2020-05-22 NOTE — Progress Notes (Signed)
[facility]  PATIENT: Lisa Chambers DOB: 1950-08-20  REFERRING CLINICIAN: Ursula Alert, MD HISTORY FROM: patient  REASON FOR VISIT: new consult    HISTORICAL  CHIEF COMPLAINT:  Chief Complaint  Patient presents with  . Memory Loss    Rm 7 New Pt MMSE 29    HISTORY OF PRESENT ILLNESS:   70 year old female here for evaluation of word retrieval problems and cognitive difficulties.  Patient has noticed these problems since around 2010, previously had MRI, neuropsychological testing and evaluation at [facility].  Stroke and MS were ruled out.  Patient was also having issues with depression and stress.  Symptoms have continued over time.  She had reevaluation recently by psychiatry and neuropsychology, and was recommended to have neurology follow-up.    REVIEW OF SYSTEMS: Full 14 system review of systems performed and negative with exception of: as per HPI.  ALLERGIES: Allergies  Allergen Reactions  . Erythromycin Other (See Comments)    GI upset GI upset   . Levofloxacin Other (See Comments)    Joint pain Joint pain   . Tetracycline Other (See Comments)    GI upset GI upset   . Aspirin Swelling    Lips only.    HOME MEDICATIONS: Outpatient Medications Prior to Visit  Medication Sig Dispense Refill  . AMYLASE-LIPASE-PROTEASE PO Take by mouth.    Marland Kitchen buPROPion (WELLBUTRIN XL) 300 MG 24 hr tablet bupropion HCl XL 300 mg 24 hr tablet, extended release 90 tablet 1  . Cholecalciferol 25 MCG (1000 UT) capsule Take by mouth.    . cycloSPORINE (RESTASIS) 0.05 % ophthalmic emulsion Restasis 0.05 % eye drops in a dropperette  INT 1 GTT IN OU BID UTD    . estradiol (ESTRACE) 0.1 MG/GM vaginal cream Place vaginally.    . fluticasone (FLONASE) 50 MCG/ACT nasal spray 2 sprays daily.     . Fluticasone-Salmeterol (ADVAIR HFA IN) Inhale into the lungs 2 (two) times daily.    . hydrochlorothiazide (HYDRODIURIL) 25 MG tablet Take 25 mg by mouth daily.    Marland Kitchen L-Lysine  1000 MG TABS Take 1 tablet by mouth daily.    . meloxicam (MOBIC) 15 MG tablet Take 15 mg by mouth daily.    . montelukast (SINGULAIR) 10 MG tablet 10 mg daily.     . Omega-3 Fatty Acids (FISH OIL) 1200 MG CAPS Take by mouth daily in the afternoon.    Marland Kitchen omeprazole (PRILOSEC) 20 MG capsule Take 20 mg by mouth daily.    . Probiotic Product (PROBIOTIC PO) Take by mouth.    . progesterone (PROMETRIUM) 100 MG capsule Take 100 mg by mouth daily.    Marland Kitchen venlafaxine XR (EFFEXOR-XR) 75 MG 24 hr capsule Take 1 capsule (75 mg total) by mouth 2 (two) times daily. Take 1 capsule daily at 8 AM and 3 PM 180 capsule 1  . zaleplon (SONATA) 10 MG capsule Take by mouth.    . estradiol (VIVELLE-DOT) 0.05 MG/24HR patch Minivelle 0.05 mg/24 hr transdermal patch  Apply 1 patch twice a week by transdermal route. (Patient not taking: Reported on 05/22/2020)    . Fexofenadine HCl (ALLEGRA PO) Take by mouth as needed. (Patient not taking: Reported on 05/22/2020)    . amoxicillin-clavulanate (AUGMENTIN) 875-125 MG tablet Take 1 tablet by mouth 2 (two) times daily.     No facility-administered medications prior to visit.    PAST MEDICAL HISTORY: Past Medical History:  Diagnosis Date  . Asthma   . Cancer ([place])  skin  . GERD (gastroesophageal reflux disease)   . Hearing loss   . HTN (hypertension)   . OSA on CPAP   . Thyroid disease     PAST SURGICAL HISTORY: Past Surgical History:  Procedure Laterality Date  . CHOLECYSTECTOMY    . NASAL SINUS SURGERY  1996, 2007  . REPLACEMENT TOTAL KNEE Bilateral 2017  . TUBAL LIGATION  1994    FAMILY HISTORY: Family History  Problem Relation Age of Onset  . Cancer Mother        breast  . Cancer Father        lung  . Pneumonia Father   . Cancer Maternal Grandmother        colon  . Alcohol abuse Brother   . Diabetes Brother   . Heart attack Brother   . COPD Brother   . Other Brother        sleep disorder    SOCIAL HISTORY: Social History   Socioeconomic  History  . Marital status: Married    Spouse name: Lanny Hurst  . Number of children: 2  . Years of education: Not on file  . Highest education level: Bachelor's degree (e.g., BA, AB, BS)  Occupational History    Comment: retired  Tobacco Use  . Smoking status: Never Smoker  . Smokeless tobacco: Never Used  Substance and Sexual Activity  . Alcohol use: No  . Drug use: No  . Sexual activity: Not on file  Other Topics Concern  . Not on file  Social History Narrative   Lives with husband   Caffeine- diet coke 2 daily   Social Determinants of Health   Financial Resource Strain: Not on file  Food Insecurity: Not on file  Transportation Needs: Not on file  Physical Activity: Not on file  Stress: Not on file  Social Connections: Not on file  Intimate Partner Violence: Not on file     PHYSICAL EXAM  GENERAL EXAM/CONSTITUTIONAL: Vitals:  Vitals:   05/22/20 0934  BP: 133/83  Pulse: 74  Weight: 225 lb (102.1 kg)  Height: 5\' 5"  (1.651 m)     Body mass index is 37.44 kg/m. Wt Readings from Last 3 Encounters:  05/22/20 225 lb (102.1 kg)  01/18/20 226 lb 4 oz (102.6 kg)  12/08/19 220 lb (99.8 kg)     Patient is in no distress; well developed, nourished and groomed; neck is supple  CARDIOVASCULAR:  Examination of carotid arteries is normal; no carotid bruits  Regular rate and rhythm, no murmurs  Examination of peripheral vascular system by observation and palpation is normal  EYES:  Ophthalmoscopic exam of optic discs and posterior segments is normal; no papilledema or hemorrhages  No exam data present  MUSCULOSKELETAL:  Gait, strength, tone, movements noted in Neurologic exam below  NEUROLOGIC: MENTAL STATUS:  MMSE - Mini Mental State Exam 05/22/2020  Orientation to time 5  Orientation to Place 5  Registration 3  Attention/ Calculation 4  Recall 3  Language- name 2 objects 2  Language- repeat 1  Language- follow 3 step command 3  Language- read & follow  direction 1  Write a sentence 1  Copy design 1  Total score 29    awake, alert, oriented to person, place and time  recent and remote memory intact  normal attention and concentration  language fluent, comprehension intact, naming intact  fund of knowledge appropriate  CRANIAL NERVE:   2nd - no papilledema on fundoscopic exam  2nd, 3rd, 4th, 6th -  pupils equal and reactive to light, visual fields full to confrontation, extraocular muscles intact, no nystagmus  5th - facial sensation symmetric  7th - facial strength symmetric  8th - hearing intact  9th - palate elevates symmetrically, uvula midline  11th - shoulder shrug symmetric  12th - tongue protrusion midline  MOTOR:   normal bulk and tone, full strength in the BUE, BLE  SENSORY:   normal and symmetric to light touch, temperature, vibration  COORDINATION:   finger-nose-finger, fine finger movements normal  REFLEXES:   deep tendon reflexes TRACE and symmetric  GAIT/STATION:   narrow based gait; romberg is negative     DIAGNOSTIC DATA (LABS, IMAGING, TESTING) - I reviewed patient records, labs, notes, testing and imaging myself where available.  Lab Results  Component Value Date   WBC 8.8 12/08/2019   HGB 13.8 12/08/2019   HCT 42.3 12/08/2019   MCV 83.6 12/08/2019   PLT 310 12/08/2019      Component Value Date/Time   NA 135 12/08/2019 1241   NA 143 11/24/2011 0054   K 4.0 12/08/2019 1241   K 3.7 11/24/2011 0054   CL 99 12/08/2019 1241   CL 110 (H) 11/24/2011 0054   CO2 26 12/08/2019 1241   CO2 25 11/24/2011 0054   GLUCOSE 97 12/08/2019 1241   GLUCOSE 99 11/24/2011 0054   BUN 17 12/08/2019 1241   BUN 13 11/24/2011 0054   CREATININE 0.85 12/08/2019 1241   CREATININE 1.06 11/24/2011 0054   CALCIUM 9.2 12/08/2019 1241   CALCIUM 8.9 11/24/2011 0054   PROT 6.8 11/24/2011 0054   ALBUMIN 3.4 11/24/2011 0054   AST 24 11/24/2011 0054   ALT 28 11/24/2011 0054   ALKPHOS 95 11/24/2011  0054   BILITOT 0.4 11/24/2011 0054   GFRNONAA >60 12/08/2019 1241   GFRNONAA 58 (L) 11/24/2011 0054   GFRAA >60 07/10/2019 1403   GFRAA >60 11/24/2011 0054   No results found for: CHOL, HDL, LDLCALC, LDLDIRECT, TRIG, CHOLHDL No results found for: HGBA1C Lab Results  Component Value Date   VITAMINB12 476 03/14/2020   No results found for: TSH   09/21/08 MRI brain [I reviewed images myself and agree with interpretation. -VRP]  1.  Multiple subcortical and deep white matter lesions are noted. Chronic  white matter ischemic changes could present in this fashion and are  statistically most likely. Other white matter diseases such as MS and  vasculitis could present in this fashion.  2.  No evidence of acute ischemia, mass lesion or enhancing lesion.    04/11/20 neuropsych testing Clinical Impressions: Diagnosis deferred. - Results of the current cognitive evaluation are largely within normal limits, with most areas of function consistent with estimated premorbid intellectual abilities. However, there are a few areas of relative weakness suggestive of mild fronto-subcortical dysfunction (e.g., slowed visuomotor cognitive information processing, proactive interference, non-verbal reasoning and problem solving difficulty, and mildly impaired phonemic verbal fluency. Her testing results do not warrant a diagnosis of a neurodevelopmental or neurocognitive disorder at this time. Based on her medical history including multiple vascular risk factors, past neuroimaging demonstrating signs of chronic small vessel ischemic changes, a vascular etiology is likely to be contributing to areas of relative weakness. Psychosocial stressors may also be playing a role as patient reports being under a great deal of stress providing care for husband (diagnosed with PD) and coping with his diagnosis. She recently moved back to the area from the [place] of the state and having a hard  time meeting friends and finding  support.       ASSESSMENT AND PLAN  70 y.o. year old female here with:  Dx:  1. Memory change      PLAN:  SUBJECTIVE MILD COGNITIVE DIFFICULTY (essentially normal neuropsychology testing) - symptoms likely related to self perception and stress levels - daily physical activity / exercise (at least 15-30 minutes) - eat more plants / vegetables - increase social activities, brain stimulation, games, puzzles, hobbies, crafts, arts, music - aim for at least 7-8 hours sleep per night (or more) - avoid smoking and alcohol  MILD MRI BRAIN FINDINGS - non-specific gliosis, likely related to age, lipids, sugar - not related to patient's symptoms; monitor  Return for return to PCP, pending if symptoms worsen or fail to improve.  I spent 62 minutes of face-to-face and non-face-to-face time with patient.  This included previsit chart review, lab review, study review, order entry, electronic health record documentation, patient education.      Penni Bombard, MD 5/63/8937, 3:42 AM Certified in Neurology, Neurophysiology and Neuroimaging  [facility] [address] [place], [place] [address] [phone]

## 2020-05-22 NOTE — Patient Instructions (Signed)
  SUBJECTIVE COGNITIVE DIFFICULTY (normal neuropsychology testing) - symptoms likely related to self perception and stress levels - daily physical activity / exercise (at least 15-30 minutes) - eat more plants / vegetables - increase social activities, brain stimulation, games, puzzles, hobbies, crafts, arts, music - aim for at least 7-8 hours sleep per night (or more) - avoid smoking and alcohol   MILD MRI BRAIN FINDINGS - non-specific gliosis, likely related to age, lipids, sugar - not related to patient's symptoms; monitor

## 2020-07-02 ENCOUNTER — Encounter: Payer: Self-pay | Admitting: Psychiatry

## 2020-07-02 ENCOUNTER — Other Ambulatory Visit: Payer: Self-pay

## 2020-07-02 ENCOUNTER — Telehealth (INDEPENDENT_AMBULATORY_CARE_PROVIDER_SITE_OTHER): Payer: Medicare PPO | Admitting: Psychiatry

## 2020-07-02 DIAGNOSIS — R413 Other amnesia: Secondary | ICD-10-CM

## 2020-07-02 DIAGNOSIS — F3342 Major depressive disorder, recurrent, in full remission: Secondary | ICD-10-CM | POA: Diagnosis not present

## 2020-07-02 DIAGNOSIS — Z8659 Personal history of other mental and behavioral disorders: Secondary | ICD-10-CM

## 2020-07-02 MED ORDER — VENLAFAXINE HCL ER 75 MG PO CP24: 75.0000 mg | ORAL_CAPSULE | Freq: Two times a day (BID) | ORAL | 1 refills | Status: DC

## 2020-07-02 MED ORDER — BUPROPION HCL ER (XL) 300 MG PO TB24: ORAL_TABLET | ORAL | 1 refills | Status: DC

## 2020-07-02 NOTE — Progress Notes (Signed)
Virtual Chambers via Video Note  I connected with Lisa Chambers on 07/02/20 at 11:00 AM EDT by a video enabled telemedicine application and verified that I am speaking with the correct person using two identifiers.  Location Provider Location : [facility] Patient Location : Home  Participants: Patient , Provider    I discussed the limitations of evaluation and management by telemedicine and the availability of in person appointments. The patient expressed understanding and agreed to proceed.   I discussed the assessment and treatment plan with the patient. The patient was provided an opportunity to ask questions and all were answered. The patient agreed with the plan and demonstrated an understanding of the instructions.   The patient was advised to call back or seek an in-person evaluation if the symptoms worsen or if the condition fails to improve as anticipated.   Lisa Chambers OP Progress Note  07/02/2020 1:28 PM Lisa Chambers  MRN:  [id number]  Chief Complaint:  Chief Complaint    Follow-up; Depression     HPI: Lisa Chambers is a 70 year Lisa Caucasian female, married, retired, lives in [place], has a history of depression, ADHD per history, obstructive sleep apnea on CPAP, asthma, hypertension, history of thyroid nodule, GERD was evaluated by telemedicine today.  Patient today reports overall she is doing well.  She is currently sleeping well.  She is exercising more often.  She reports she does play pickle ball with her friends and that does help.  She reports she is trying to cope with her husband's Parkinson's disease diagnosis.  She is interested in resources in the community and will reach out to the social worker who is working with her husband's provider.  Patient reports she is compliant on medications as prescribed.  Denies any significant depression or anxiety symptoms.  Patient reports sleep as good.  She reports she had an appointment with a neurologist and was told  that he was not worried about her memory testing or changes at this time.  She did not require any further testing for that.  She currently denies any memory problems and is doing well.  Patient denies any other concerns today.  Chambers Diagnosis:    ICD-10-CM   1. MDD (major depressive disorder), recurrent, in full remission ([place])  F33.42 venlafaxine XR (EFFEXOR-XR) 75 MG 24 hr capsule    buPROPion (WELLBUTRIN XL) 300 MG 24 hr tablet  2. Memory loss  R41.3   3. History of ADHD  Z86.59     Past Psychiatric History: I have reviewed past psychiatric history from progress note on 12/13/2019.  Past trials of Ambien, Abilify, Effexor, Adderall, Wellbutrin, Viibryd  Past Medical History:  Past Medical History:  Diagnosis Date  . Asthma   . Cancer ([place])    skin  . GERD (gastroesophageal reflux disease)   . Hearing loss   . HTN (hypertension)   . OSA on CPAP   . Thyroid disease     Past Surgical History:  Procedure Laterality Date  . CHOLECYSTECTOMY    . NASAL SINUS SURGERY  1996, 2007  . REPLACEMENT TOTAL KNEE Bilateral 2017  . TUBAL LIGATION  1994    Family Psychiatric History: I have reviewed family psychiatric history from progress note on 12/13/2019  Family History:  Family History  Problem Relation Age of Onset  . Cancer Mother        breast  . Cancer Father        lung  . Pneumonia Father   .  Cancer Maternal Grandmother        colon  . Alcohol abuse Brother   . Diabetes Brother   . Heart attack Brother   . COPD Brother   . Other Brother        sleep disorder    Social History: I have reviewed social history from progress note on 12/13/2019 Social History   Socioeconomic History  . Marital status: Married    Spouse name: Lanny Hurst  . Number of children: 2  . Years of education: Not on file  . Highest education level: Bachelor's degree (e.g., BA, AB, BS)  Occupational History    Comment: retired  Tobacco Use  . Smoking status: Never Smoker  . Smokeless tobacco:  Never Used  Substance and Sexual Activity  . Alcohol use: No  . Drug use: No  . Sexual activity: Not on file  Other Topics Concern  . Not on file  Social History Narrative   Lives with husband   Caffeine- diet coke 2 daily   Social Determinants of Health   Financial Resource Strain: Not on file  Food Insecurity: Not on file  Transportation Needs: Not on file  Physical Activity: Not on file  Stress: Not on file  Social Connections: Not on file    Allergies:  Allergies  Allergen Reactions  . Erythromycin Other (See Comments)    GI upset GI upset   . Levofloxacin Other (See Comments)    Joint pain Joint pain   . Tetracycline Other (See Comments)    GI upset GI upset   . Aspirin Swelling    Lips only.    Metabolic Disorder Labs: No results found for: HGBA1C, MPG No results found for: PROLACTIN No results found for: CHOL, TRIG, HDL, CHOLHDL, VLDL, LDLCALC No results found for: TSH  Therapeutic Level Labs: No results found for: LITHIUM No results found for: VALPROATE No components found for:  CBMZ  Current Medications: Current Outpatient Medications  Medication Sig Dispense Refill  . AMYLASE-LIPASE-PROTEASE Chambers Take by mouth.    Marland Kitchen buPROPion (WELLBUTRIN XL) 300 MG 24 hr tablet bupropion HCl XL 300 mg 24 hr tablet, extended release 90 tablet 1  . Cholecalciferol 25 MCG (1000 UT) capsule Take by mouth.    . cycloSPORINE (RESTASIS) 0.05 % ophthalmic emulsion Restasis 0.05 % eye drops in a dropperette  INT 1 GTT IN OU BID UTD    . estradiol (ESTRACE) 0.1 MG/GM vaginal cream Place vaginally.    Marland Kitchen estradiol (VIVELLE-DOT) 0.05 MG/24HR patch [place] 0.05 mg/24 hr transdermal patch  Apply 1 patch twice a week by transdermal route. (Patient not taking: Reported on 05/22/2020)    . Fexofenadine HCl (Lisa Chambers) Take by mouth as needed. (Patient not taking: Reported on 05/22/2020)    . fluticasone (FLONASE) 50 MCG/ACT nasal spray 2 sprays daily.     .  Fluticasone-Salmeterol (ADVAIR HFA IN) Inhale into the lungs 2 (two) times daily.    . hydrochlorothiazide (HYDRODIURIL) 25 MG tablet Take 25 mg by mouth daily.    Marland Kitchen L-Lysine 1000 MG TABS Take 1 tablet by mouth daily.    . meloxicam (MOBIC) 15 MG tablet Take 15 mg by mouth daily.    . montelukast (SINGULAIR) 10 MG tablet 10 mg daily.     . Omega-3 Fatty Acids (FISH OIL) 1200 MG CAPS Take by mouth daily in the afternoon.    Marland Kitchen omeprazole (PRILOSEC) 20 MG capsule Take 20 mg by mouth daily.    . Probiotic Product (PROBIOTIC  Chambers) Take by mouth.    . progesterone (PROMETRIUM) 100 MG capsule Take 100 mg by mouth daily.    Marland Kitchen venlafaxine XR (EFFEXOR-XR) 75 MG 24 hr capsule Take 1 capsule (75 mg total) by mouth 2 (two) times daily. Take 1 capsule daily at 8 AM and 3 PM 180 capsule 1  . zaleplon (SONATA) 10 MG capsule Take by mouth.     No current facility-administered medications for this Chambers.     Musculoskeletal: Strength & Muscle Tone: UTA Gait & Station: UTA Patient leans: N/A  Psychiatric Specialty Exam: Review of Systems  Psychiatric/Behavioral: Negative for agitation, behavioral problems, confusion, decreased concentration, dysphoric mood, hallucinations, self-injury, sleep disturbance and suicidal ideas. The patient is not nervous/anxious and is not hyperactive.   All other systems reviewed and are negative.   There were no vitals taken for this Chambers.There is no height or weight on file to calculate BMI.  General Appearance: Casual  Eye Contact:  Fair  Speech:  Clear and Coherent  Volume:  Normal  Mood:  Euthymic  Affect:  Congruent  Thought Process:  Goal Directed and Descriptions of Associations: Intact  Orientation:  Full (Time, Place, and Person)  Thought Content: Logical   Suicidal Thoughts:  No  Homicidal Thoughts:  No  Memory:  Immediate;   Fair Recent;   Fair Remote;   Fair  Judgement:  Fair  Insight:  Fair  Psychomotor Activity:  Normal  Concentration:   Concentration: Fair and Attention Span: Fair  Recall:  [employer] of Knowledge: Fair  Language: Fair  Akathisia:  No  Handed:  Right  AIMS (if indicated): uta  Assets:  Communication Skills Desire for Improvement Housing Social Support  ADL's:  Intact  Cognition: WNL  Sleep:  Fair   Screenings: Mini-Mental   [place] Office Chambers from 05/22/2020 in Lisa Chambers  Total Score (max 30 points ) 29    PHQ2-9   Flowsheet Row Video Chambers from 04/23/2020 in Lisa Chambers from 03/06/2020 in Lisa Chambers from 12/13/2019 in Lisa Chambers  PHQ-2 Total Score 0 2 0  PHQ-9 Total Score -- 8 0    Flowsheet Row Video Chambers from 04/23/2020 in [place] No Risk       Assessment and Plan: YANIS JUMA is a 70 year Lisa Caucasian female, retired, married, has a history of depression, cognitive problems, multiple medical problems including obstructive sleep apnea on CPAP was evaluated by telemedicine today.  Patient with multiple psychosocial stressors including her husband's health issues and is currently stable.  Plan MDD in remission Venlafaxine extended release 75 mg p.o. twice daily Wellbutrin XL 300 mg p.o. daily Long-term plan is to taper her off. Continue CBT as needed.  Will send communication to front desk to schedule patient with Ms. Lisa Chambers  History of ADHD/memory loss- improving Patient had neuropsychological testing done per Lisa Chambers,Lisa Chambers I have reviewed notes per Lisa Chambers. Jason Chambers 05/22/2020-symptoms likely related to self perception and stress levels.  Daily physical activity/exercise at least 15 to 30 minutes Eat plant/vegetables.  Increase social activities, brain stimulation, games, puzzles, hobbies, crafts, arts, music. Sleep at least 7 to 8 hours per night Avoid smoking and  alcohol.   Follow-up in clinic in 3 months in office or sooner as needed.  This note was generated in part or whole with voice recognition software. Voice recognition is usually quite accurate but there are transcription  errors that can and very often do occur. I apologize for any typographical errors that were not detected and corrected.       Lisa Alert, Chambers 07/02/2020, 1:28 PM

## 2020-07-29 ENCOUNTER — Other Ambulatory Visit: Payer: Self-pay

## 2020-07-29 ENCOUNTER — Ambulatory Visit (INDEPENDENT_AMBULATORY_CARE_PROVIDER_SITE_OTHER): Payer: Medicare PPO | Admitting: Licensed Clinical Social Worker

## 2020-07-29 DIAGNOSIS — F4322 Adjustment disorder with anxiety: Secondary | ICD-10-CM

## 2020-07-29 NOTE — Progress Notes (Signed)
Virtual Visit via Video Chambers  I connected with Lisa Chambers on 07/29/20 at 10:00 AM EDT by a video enabled telemedicine application and verified that I am speaking with the correct person using two identifiers.  Location: Patient: home Provider: remote office [place], [place])   I discussed the limitations of evaluation and management by telemedicine and the availability of in person appointments. The patient expressed understanding and agreed to proceed.  I discussed the assessment and treatment plan with the patient. The patient was provided an opportunity to ask questions and all were answered. The patient agreed with the plan and demonstrated an understanding of the instructions.   The patient was advised to call back or seek an in-person evaluation if the symptoms worsen or if the condition fails to improve as anticipated.  I provided 34 minutes of non-face-to-face time during this encounter.   Lisa Schneck R Senica Crall, LCSW   Lisa Chambers  Session Time: 4-0:08Q  Participation Level: Active  Behavioral Response: Neat and Well GroomedAlertAnxious  Type of Therapy: Individual Therapy  Treatment Goals addressed: Anxiety and Coping  Interventions: CBT, Solution Focused, and Supportive  Summary: Lisa Chambers is a 70 y.o. female who presents with continuing symptoms related to adjustment disorder/anxious mood. 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 Pt states that overall mood is stable. Pt reports that she is frequently utilizing coping skills to manage current symptoms of distress.   Allowed pt to explore and express thoughts and feelings associated with recent life situations and external stressors. Explored local support groups--pt prefers face to face. LCSW clinician not sure whether [facility] groups currently virtual or face to face. Provided pt with resources pertaining to [facility] based caregiver support groups.   Recommended book "Meet me where I am" to help support from a caregiver's perspective.   Reviewed coping skills for stress/anxiety and depression. Pt was reflective and engaged throughout session.   Continued recommendations are as follows: self care behaviors, positive social engagements, focusing on overall work/home/life balance, and focusing on positive physical and emotional wellness.    Suicidal/Homicidal: No  Lisa Response: Initial session with Lisa Chambers--assessed current need/stressors and revised overall treatment plan.  Plan: Return again in 4 weeks.  Diagnosis: Axis I: MDD, in remission;  Situational Stress    Axis II: No diagnosis    Rachel Bo Tykeshia Tourangeau, LCSW 07/29/2020

## 2020-09-09 ENCOUNTER — Ambulatory Visit (INDEPENDENT_AMBULATORY_CARE_PROVIDER_SITE_OTHER): Payer: Medicare PPO | Admitting: Licensed Clinical Social Worker

## 2020-09-09 ENCOUNTER — Other Ambulatory Visit: Payer: Self-pay

## 2020-09-09 DIAGNOSIS — F4322 Adjustment disorder with anxiety: Secondary | ICD-10-CM | POA: Diagnosis not present

## 2020-09-09 NOTE — Progress Notes (Signed)
Virtual Visit via Video Note  I connected with Lisa Chambers on 09/09/20 at 10:00 AM EDT by a video enabled telemedicine application and verified that I am speaking with the correct person using two identifiers.  Location: Patient: home Provider: remote office [place], [place])   I discussed the limitations of evaluation and management by telemedicine and the availability of in person appointments. The patient expressed understanding and agreed to proceed.  I discussed the assessment and treatment plan with the patient. The patient was provided an opportunity to ask questions and all were answered. The patient agreed with the plan and demonstrated an understanding of the instructions.   The patient was advised to call back or seek an in-person evaluation if the symptoms worsen or if the condition fails to improve as anticipated.  I provided 45 minutes of non-face-to-face time during this encounter.   Levander Katzenstein R Patrcia Schnepp, LCSW   THERAPIST PROGRESS NOTE  Session Time:10-10:45a  Participation Level: Active  Behavioral Response: Neat and Well GroomedAlertIrritable and Worthless  Type of Therapy: Individual Therapy  Treatment Goals addressed: Anxiety  Interventions: CBT  Summary: Lisa Chambers is a 70 y.o. female who presents with Patient reports improving symptoms related to adjustment disorder diagnosis. Patient reports that she just got back from [place] attending the wedding of her step son. Patient reports that she attended a big family reunion in [place]. Patient reports that she had a very good time during this vacation and spend some good quality time with family members that she has not been in touch with in quite some time. Allow patient safe space to explore and express thoughts and feelings associated with recent external stressors. Patient reports that she is currently been tracking her food and wants to continue to be more mindful of what she is eating. Patient is  also currently doing 15-9 fasting. Patient states that her first meal of the day is around 10:00 AM and she stops at 7:00 PM. Patient is also being intentional about drinking water throughout the day, and increasing physical activity. Discussed best ways to track progress, and encouraged patient to quantify as many variables as she can so that she can track each week. Patient feels that this is working better than Weight [employer] in the past. 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 Continued recommendations are as follows: self care behaviors, positive social engagements, focusing on overall work/home/life balance, and focusing on positive physical and emotional wellness. ..   Suicidal/Homicidal: No  Therapist Response: Patient reports that she is able to stabilize anxiety levels while increasing ability to function on a daily basis. Patient is able to enhance her ability to handle effectively the full variety of life's anxieties. Patient can identify or coping mechanism that's been successful in the past and increase its use. Patient is developing healthy interpersonal relationships that lead to alleviation and help prevent the relapse of depression symptoms. These behaviors are reflective of continuing personal growth and progress. Treatment to continue as indicated.  Plan: Return again in 4 weeks.  Diagnosis: Axis I: Adjustment disorder with anxious mood    Axis II: No diagnosis    [place], LCSW 09/09/2020

## 2020-10-01 ENCOUNTER — Ambulatory Visit (INDEPENDENT_AMBULATORY_CARE_PROVIDER_SITE_OTHER): Payer: Medicare PPO | Admitting: Psychiatry

## 2020-10-01 ENCOUNTER — Other Ambulatory Visit: Payer: Self-pay

## 2020-10-01 ENCOUNTER — Encounter: Payer: Self-pay | Admitting: Psychiatry

## 2020-10-01 VITALS — BP 127/85 | HR 78 | Temp 96.9°F | Wt 224.4 lb

## 2020-10-01 DIAGNOSIS — F3342 Major depressive disorder, recurrent, in full remission: Secondary | ICD-10-CM

## 2020-10-01 DIAGNOSIS — Z8659 Personal history of other mental and behavioral disorders: Secondary | ICD-10-CM | POA: Diagnosis not present

## 2020-10-01 NOTE — Progress Notes (Signed)
[facility] MD OP Progress Note  10/01/2020 8:15 AM Lisa Chambers  MRN:  [id number]  Chief Complaint:  Chief Complaint   Follow-up; Anxiety; Depression    HPI: Lisa Chambers is a 70 year old Caucasian female, married,retired, lives in [place], has a history of depression, ADHD per history, obstructive sleep apnea on CPAP, asthma, hypertension, history of thyroid nodule, GERD was evaluated in office today.  Patient today reports she is currently doing well.  Denies any significant depression or anxiety symptoms.  She has been compliant on the venlafaxine and the Wellbutrin.  Denies side effects.  She has been walking more, watching her diet, keeping a journal of her meals.  She reports she has lost 5 pounds in the past 3 weeks and is excited about that.  She is currently following up with therapist Ms. Christina Hussami, reports therapy sessions are beneficial.  She denies any suicidality, homicidality or perceptual disturbances.  Patient denies any other concerns today.  Visit Diagnosis:    ICD-10-CM   1. MDD (major depressive disorder), recurrent, in full remission ([place])  F33.42     2. History of ADHD  Z86.59       Past Psychiatric History: Reviewed past psychiatric history from progress note on 12/13/2019.  Past trials of Ambien, Abilify, Effexor, Adderall, Wellbutrin, Viibryd  Past Medical History:  Past Medical History:  Diagnosis Date   Asthma    Cancer ([place])    skin   GERD (gastroesophageal reflux disease)    Hearing loss    HTN (hypertension)    OSA on CPAP    Thyroid disease     Past Surgical History:  Procedure Laterality Date   [place], 2007   REPLACEMENT TOTAL KNEE Bilateral 2017   TUBAL LIGATION  1994    Family Psychiatric History: Reviewed family psychiatric history from progress note on 12/13/2019  Family History:  Family History  Problem Relation Age of Onset   Cancer Mother        breast   Cancer Father         lung   Pneumonia Father    Cancer Maternal Grandmother        colon   Alcohol abuse Brother    Diabetes Brother    Heart attack Brother    COPD Brother    Other Brother        sleep disorder    Social History: Reviewed social history from progress note on 12/13/2019 Social History   Socioeconomic History   Marital status: Married    Spouse name: Lanny Hurst   Number of children: 2   Years of education: Not on file   Highest education level: Bachelor's degree (e.g., BA, AB, BS)  Occupational History    Comment: retired  Tobacco Use   Smoking status: Never   Smokeless tobacco: Never  Substance and Sexual Activity   Alcohol use: No   Drug use: No   Sexual activity: Not on file  Other Topics Concern   Not on file  Social History Narrative   Lives with husband   Caffeine- diet coke 2 daily   Social Determinants of Health   Financial Resource Strain: Not on file  Food Insecurity: Not on file  Transportation Needs: Not on file  Physical Activity: Not on file  Stress: Not on file  Social Connections: Not on file    Allergies:  Allergies  Allergen Reactions   Erythromycin Other (See Comments)  GI upset GI upset    Levofloxacin Other (See Comments)    Joint pain Joint pain    Tetracycline Other (See Comments)    GI upset GI upset    Aspirin Swelling    Lips only.    Metabolic Disorder Labs: No results found for: HGBA1C, MPG No results found for: PROLACTIN No results found for: CHOL, TRIG, HDL, CHOLHDL, VLDL, LDLCALC No results found for: TSH  Therapeutic Level Labs: No results found for: LITHIUM No results found for: VALPROATE No components found for:  CBMZ  Current Medications: Current Outpatient Medications  Medication Sig Dispense Refill   AMYLASE-LIPASE-PROTEASE PO Take by mouth.     buPROPion (WELLBUTRIN XL) 300 MG 24 hr tablet bupropion HCl XL 300 mg 24 hr tablet, extended release 90 tablet 1   Cholecalciferol 25 MCG (1000 UT) capsule Take  by mouth.     cycloSPORINE (RESTASIS) 0.05 % ophthalmic emulsion Restasis 0.05 % eye drops in a dropperette  INT 1 GTT IN OU BID UTD     estradiol (ESTRACE) 0.1 MG/GM vaginal cream Place vaginally.     estradiol (VIVELLE-DOT) 0.05 MG/24HR patch      Fexofenadine HCl (ALLEGRA PO) Take by mouth as needed.     fluticasone (FLONASE) 50 MCG/ACT nasal spray 2 sprays daily.      Fluticasone-Salmeterol (ADVAIR HFA IN) Inhale into the lungs 2 (two) times daily.     hydrochlorothiazide (HYDRODIURIL) 25 MG tablet Take 1 tablet by mouth daily.     L-Lysine 1000 MG TABS Take 1 tablet by mouth daily.     montelukast (SINGULAIR) 10 MG tablet 10 mg daily.      Omega-3 Fatty Acids (FISH OIL) 1200 MG CAPS Take by mouth daily in the afternoon.     omeprazole (PRILOSEC) 20 MG capsule Take 20 mg by mouth daily.     Probiotic Product (PROBIOTIC PO) Take by mouth.     progesterone (PROMETRIUM) 100 MG capsule Take 100 mg by mouth daily.     venlafaxine XR (EFFEXOR-XR) 75 MG 24 hr capsule Take 1 capsule (75 mg total) by mouth 2 (two) times daily. Take 1 capsule daily at 8 AM and 3 PM 180 capsule 1   zaleplon (SONATA) 10 MG capsule Take by mouth.     No current facility-administered medications for this visit.     Musculoskeletal: Strength & Muscle Tone: within normal limits Gait & Station: normal Patient leans: N/A  Psychiatric Specialty Exam: Review of Systems  Psychiatric/Behavioral:  Negative for agitation, behavioral problems, confusion, decreased concentration, dysphoric mood, hallucinations, self-injury, sleep disturbance and suicidal ideas. The patient is not nervous/anxious and is not hyperactive.   All other systems reviewed and are negative.  Blood pressure 127/85, pulse 78, temperature (!) 96.9 F (36.1 C), temperature source Temporal, weight 224 lb 6.4 oz (101.8 kg).Body mass index is 37.34 kg/m.  General Appearance: Casual  Eye Contact:  Good  Speech:  Clear and Coherent  Volume:  Normal   Mood:  Euthymic  Affect:  Congruent  Thought Process:  Goal Directed and Descriptions of Associations: Intact  Orientation:  Full (Time, Place, and Person)  Thought Content: Logical   Suicidal Thoughts:  No  Homicidal Thoughts:  No  Memory:  Immediate;   Fair Recent;   Fair Remote;   Fair  Judgement:  Fair  Insight:  Fair  Psychomotor Activity:  Normal  Concentration:  Concentration: Fair and Attention Span: Fair  Recall:  [employer] of Knowledge: Fair  Language: Fair  Akathisia:  No  Handed:  Right  AIMS (if indicated): done  Assets:  Communication Skills Desire for Improvement Housing Social Support  ADL's:  Intact  Cognition: WNL  Sleep:  Fair   Screenings: [place] Office Visit from 10/01/2020 in [facility] Total Score 0      GAD-7    [place] Office Visit from 10/01/2020 in [facility]  Total GAD-7 Score 0      [place] Visit from 05/22/2020 in [facility]  Total Score (max 30 points ) 29      PHQ2-9    [place] Visit from 10/01/2020 in [facility] from 09/09/2020 in [facility] from 07/29/2020 in [facility] Video Visit from 04/23/2020 in [place] Video Visit from 03/06/2020 in [facility]  PHQ-2 Total Score 0 0 0 0 2  PHQ-9 Total Score 0 -- -- -- 8      [place] from 10/01/2020 in [facility] from 09/09/2020 in [facility] from 07/29/2020 in [facility] No Risk No Risk No Risk        Assessment and Plan: KIMBELY POHLE is a 70 year old Caucasian female, retired, married, has a history of depression, multiple  medical problems including obstructive sleep apnea with CPAP was evaluated in office today.  Patient is currently stable.  Plan as noted below.  Plan MDD in remission PHQ-9 today equals 0 Venlafaxine extended release 75 mg p.o. twice daily Wellbutrin XL 300 mg p.o. daily Continue CBT with Ms. Christina Hussami  History of ADHD-stable Patient had neuropsychological testing done per Dr.Zussman-dated 04/23/2020. Patient also consulted with neurologist-dated 05/22/2020. Memory problems are currently stable. Continue exercise, lifestyle modification, calorie counting.  Patient needs to lose weight.  Follow-up in clinic in 3 months in office.  This note was generated in part or whole with voice recognition software. Voice recognition is usually quite accurate but there are transcription errors that can and very often do occur. I apologize for any typographical errors that were not detected and corrected.      Ursula Alert, MD 10/02/2020, 8:15 AM

## 2020-10-17 ENCOUNTER — Ambulatory Visit: Payer: Medicare PPO | Admitting: Licensed Clinical Social Worker

## 2020-11-13 ENCOUNTER — Ambulatory Visit: Payer: Medicare PPO | Admitting: Licensed Clinical Social Worker

## 2020-11-25 ENCOUNTER — Other Ambulatory Visit: Payer: Self-pay | Admitting: Otolaryngology

## 2020-11-25 DIAGNOSIS — J329 Chronic sinusitis, unspecified: Secondary | ICD-10-CM

## 2020-12-09 ENCOUNTER — Ambulatory Visit
Admission: RE | Admit: 2020-12-09 | Discharge: 2020-12-09 | Disposition: A | Discharge status: Home / Self Care | Payer: Medicare PPO | Source: Ambulatory Visit | Attending: Otolaryngology | Admitting: Otolaryngology

## 2020-12-09 ENCOUNTER — Other Ambulatory Visit: Payer: Self-pay

## 2020-12-09 DIAGNOSIS — J329 Chronic sinusitis, unspecified: Secondary | ICD-10-CM

## 2020-12-13 ENCOUNTER — Other Ambulatory Visit: Payer: Self-pay | Admitting: Otolaryngology

## 2020-12-16 ENCOUNTER — Other Ambulatory Visit: Payer: Self-pay | Admitting: Otolaryngology

## 2020-12-16 DIAGNOSIS — H748X1 Other specified disorders of right middle ear and mastoid: Secondary | ICD-10-CM

## 2020-12-18 ENCOUNTER — Other Ambulatory Visit: Payer: Self-pay

## 2020-12-18 ENCOUNTER — Ambulatory Visit (INDEPENDENT_AMBULATORY_CARE_PROVIDER_SITE_OTHER): Payer: Medicare PPO | Admitting: Licensed Clinical Social Worker

## 2020-12-18 DIAGNOSIS — F4322 Adjustment disorder with anxiety: Secondary | ICD-10-CM | POA: Diagnosis not present

## 2020-12-18 NOTE — Progress Notes (Signed)
   THERAPIST PROGRESS NOTE  Session Time: 2-245p  Participation Level: Active  Behavioral Response: NeatAlertAnxious  Type of Therapy: Individual Therapy  Treatment Goals addressed:  Problem: Reduce overall frequency, intensity, and duration of the anxiety so that daily functioning is not impaired. Goal: LTG: Patient will score less than 5 on the Generalized Anxiety Disorder 7 Scale (GAD-7) Outcome: Progressing Goal: STG: Patient will participate in at least 80% of scheduled individual psychotherapy sessions Outcome: Progressing Interventions:  Intervention: REVIEW PLEASE SKILLS (TREAT PHYSICAL ILLNESS, BALANCE EATING, AVOID MOOD-ALTERING SUBSTANCES, BALANCE SLEEP AND GET EXERCISE) WITH Coralyn Mark Intervention: Assist with relaxation techniques, as appropriate (deep breathing exercises, meditation, guided imagery)   Summary: Lisa Chambers is a 70 y.o. female who presents with improving symptoms related to adjustment disorder diagnosis. Pt reports that overall mood is stable and that she is managing situational stressors well. Pt reports that she is continuing to try hard to manage nutritional plans/regimes:  pt is making healthier choices, is drinking more water, tracking overall intake, and eating within an 8 hour window. Praised pts continued focus on overall wellness.   Discussed adjustment from "structured days" to "unstructured days" since retirement. Discussed how a flexibly-structured day could make pt feel more in control and more productive. Pt worked to identify most productive time of day and work around that to schedule tasks that will require more effort.   Reviewed coping skills to manage mood changes and anxiety.  Continued recommendations are as follows: self care behaviors, positive social engagements, focusing on overall work/home/life balance, and focusing on positive physical and emotional wellness.    Suicidal/Homicidal: No  Therapist Response: Pt is continuing to  apply interventions learned in session into daily life situations. Pt is currently on track to meet goals utilizing interventions mentioned above. Personal growth and progress noted. Treatment to continue as indicated.   Plan: Return again in 4 weeks.  Diagnosis: Axis I: Adjustment Disorder with Anxiety    Axis II: No diagnosis    Rachel Bo Zamiya Dillard, LCSW 12/18/2020

## 2020-12-18 NOTE — Plan of Care (Signed)
  Problem: Reduce overall frequency, intensity, and duration of the anxiety so that daily functioning is not impaired. Goal: LTG: Patient will score less than 5 on the Generalized Anxiety Disorder 7 Scale (GAD-7) Outcome: Progressing Goal: STG: Patient will participate in at least 80% of scheduled individual psychotherapy sessions Outcome: Progressing Intervention: REVIEW PLEASE SKILLS (TREAT PHYSICAL ILLNESS, BALANCE EATING, AVOID MOOD-ALTERING SUBSTANCES, BALANCE SLEEP AND GET EXERCISE) WITH Mysti Intervention: Assist with relaxation techniques, as appropriate (deep breathing exercises, meditation, guided imagery)

## 2021-01-06 ENCOUNTER — Ambulatory Visit
Admission: RE | Admit: 2021-01-06 | Discharge: 2021-01-06 | Disposition: A | Discharge status: Home / Self Care | Payer: Medicare PPO | Source: Ambulatory Visit | Attending: Otolaryngology | Admitting: Otolaryngology

## 2021-01-06 DIAGNOSIS — H748X1 Other specified disorders of right middle ear and mastoid: Secondary | ICD-10-CM

## 2021-01-06 MED ORDER — IOPAMIDOL (ISOVUE-370) INJECTION 76%
75.0000 mL | Freq: Once | INTRAVENOUS | Status: AC | PRN
  Administered 2021-01-06: 75 mL via INTRAVENOUS

## 2021-01-13 ENCOUNTER — Other Ambulatory Visit: Payer: Self-pay | Admitting: Internal Medicine

## 2021-01-13 DIAGNOSIS — Z1231 Encounter for screening mammogram for malignant neoplasm of breast: Secondary | ICD-10-CM

## 2021-01-14 ENCOUNTER — Ambulatory Visit: Payer: Medicare PPO | Admitting: Psychiatry

## 2021-01-16 ENCOUNTER — Ambulatory Visit: Payer: Medicare PPO | Admitting: Psychiatry

## 2021-01-22 ENCOUNTER — Ambulatory Visit: Payer: Medicare PPO | Admitting: Licensed Clinical Social Worker

## 2021-02-11 ENCOUNTER — Other Ambulatory Visit: Payer: Self-pay | Admitting: Psychiatry

## 2021-02-11 DIAGNOSIS — F3342 Major depressive disorder, recurrent, in full remission: Secondary | ICD-10-CM

## 2021-02-13 ENCOUNTER — Ambulatory Visit (INDEPENDENT_AMBULATORY_CARE_PROVIDER_SITE_OTHER): Payer: Medicare PPO | Admitting: Psychiatry

## 2021-02-13 ENCOUNTER — Other Ambulatory Visit: Payer: Self-pay

## 2021-02-13 ENCOUNTER — Encounter: Payer: Self-pay | Admitting: Psychiatry

## 2021-02-13 VITALS — BP 121/83 | HR 80 | Temp 98.1°F | Wt 224.6 lb

## 2021-02-13 DIAGNOSIS — F5101 Primary insomnia: Secondary | ICD-10-CM | POA: Diagnosis not present

## 2021-02-13 DIAGNOSIS — F3342 Major depressive disorder, recurrent, in full remission: Secondary | ICD-10-CM

## 2021-02-13 DIAGNOSIS — Z8659 Personal history of other mental and behavioral disorders: Secondary | ICD-10-CM

## 2021-02-13 MED ORDER — ZALEPLON 10 MG PO CAPS: 10.0000 mg | ORAL_CAPSULE | Freq: Every evening | ORAL | 4 refills | Status: DC | PRN

## 2021-02-13 NOTE — Progress Notes (Signed)
[facility] MD OP Progress Note  02/13/2021 11:00 AM Lisa Chambers  MRN:  [id number]  Chief Complaint:  Chief Complaint   Follow-up; Anxiety    HPI: Lisa Chambers is a 71 year old Caucasian female, married, retired, lives in [place], has a history of depression, ADHD per history, obstructive sleep apnea on CPAP, asthma, hypertension, history of thyroid nodule, GERD was evaluated in office today.  Patient reports overall mood symptoms are stable.  Does not have any significant sadness, anhedonia.  Reports she had a good holiday season with her family.  Denies any significant anxiety symptoms.  Reports she is compliant on her venlafaxine and Wellbutrin.  Denies side effects.  Would like to stay on this dosage for now.  Reports sleep is overall okay.  Continues to use Sonata which helps.  Denies side effects.  Patient reports with the beginning of the new year she has been trying to bring some structure into her life by following lifestyle modification, diet management, exercising more as well as dividing her days to 5 segments and scheduling time for different activities.  So far it has been helpful.  She is interested in losing weight as well and is working towards that goal.  Patient reports her primary care provider has also been following up with her closely, has ordered multiple testing and has recommended supplements which has been helpful.  Patient with chronic sinusitis reports that has improved in the past few months.  Denies any other concerns today.  Visit Diagnosis:    ICD-10-CM   1. MDD (major depressive disorder), recurrent, in full remission ([place])  F33.42 zaleplon (SONATA) 10 MG capsule    2. Primary insomnia  F51.01 zaleplon (SONATA) 10 MG capsule    3. History of ADHD  Z86.59       Past Psychiatric History: Reviewed past psychiatric history from progress note on 12/13/2019.  Past trials of Ambien, Abilify, Effexor, Adderall, Wellbutrin ,viibryd  Past Medical History:   Past Medical History:  Diagnosis Date   Asthma    Cancer ([place])    skin   GERD (gastroesophageal reflux disease)    Hearing loss    HTN (hypertension)    OSA on CPAP    Thyroid disease     Past Surgical History:  Procedure Laterality Date   [place], 2007   REPLACEMENT TOTAL KNEE Bilateral 2017   TUBAL LIGATION  1994    Family Psychiatric History: Reviewed family psychiatric history from progress note on 12/13/2019  Family History:  Family History  Problem Relation Age of Onset   Cancer Mother        breast   Cancer Father        lung   Pneumonia Father    Cancer Maternal Grandmother        colon   Alcohol abuse Brother    Diabetes Brother    Heart attack Brother    COPD Brother    Other Brother        sleep disorder    Social History: Reviewed social history from progress note on 12/13/2019 Social History   Socioeconomic History   Marital status: Married    Spouse name: Lanny Hurst   Number of children: 2   Years of education: Not on file   Highest education level: Bachelor's degree (e.g., BA, AB, BS)  Occupational History    Comment: retired  Tobacco Use   Smoking status: Never   Smokeless tobacco: Never  Substance and Sexual Activity   Alcohol use: No   Drug use: No   Sexual activity: Not on file  Other Topics Concern   Not on file  Social History Narrative   Lives with husband   Caffeine- diet coke 2 daily   Social Determinants of Health   Financial Resource Strain: Not on file  Food Insecurity: Not on file  Transportation Needs: Not on file  Physical Activity: Not on file  Stress: Not on file  Social Connections: Not on file    Allergies:  Allergies  Allergen Reactions   Erythromycin Other (See Comments)    GI upset GI upset    Levofloxacin Other (See Comments)    Joint pain Joint pain    Tetracycline Other (See Comments)    GI upset GI upset    Aspirin Swelling and Hives    Lips only.     Metabolic Disorder Labs: No results found for: HGBA1C, MPG No results found for: PROLACTIN No results found for: CHOL, TRIG, HDL, CHOLHDL, VLDL, LDLCALC No results found for: TSH  Therapeutic Level Labs: No results found for: LITHIUM No results found for: VALPROATE No components found for:  CBMZ  Current Medications: Current Outpatient Medications  Medication Sig Dispense Refill   AMYLASE-LIPASE-PROTEASE PO Take by mouth.     buPROPion (WELLBUTRIN XL) 300 MG 24 hr tablet TAKE 1 TABLET BY MOUTH EVERY 24 HOURS 90 tablet 1   Cholecalciferol 25 MCG (1000 UT) capsule Take by mouth.     cycloSPORINE (RESTASIS) 0.05 % ophthalmic emulsion Restasis 0.05 % eye drops in a dropperette  INT 1 GTT IN OU BID UTD     estradiol (ESTRACE) 0.1 MG/GM vaginal cream Place vaginally.     estradiol (VIVELLE-DOT) 0.05 MG/24HR patch      Fexofenadine HCl (ALLEGRA PO) Take by mouth as needed.     fluticasone (FLONASE) 50 MCG/ACT nasal spray 2 sprays daily.      Fluticasone-Salmeterol (ADVAIR HFA IN) Inhale into the lungs 2 (two) times daily.     hydrochlorothiazide (HYDRODIURIL) 25 MG tablet Take 1 tablet by mouth daily.     L-Lysine 1000 MG TABS Take 1 tablet by mouth daily.     montelukast (SINGULAIR) 10 MG tablet 10 mg daily.      montelukast (SINGULAIR) 10 MG tablet Take 1 tablet by mouth daily.     Omega-3 Fatty Acids (FISH OIL) 1200 MG CAPS Take by mouth daily in the afternoon.     omeprazole (PRILOSEC) 20 MG capsule Take 20 mg by mouth daily.     Probiotic Product (PROBIOTIC PO) Take by mouth.     progesterone (PROMETRIUM) 200 MG capsule Take 200 mg by mouth daily.     thyroid (NP THYROID) 30 MG tablet Take 30 mg by mouth daily before breakfast.     venlafaxine XR (EFFEXOR-XR) 75 MG 24 hr capsule TAKE 1 CAPSULE BY MOUTH 2 TIMES DAILY. TAKE AT 8:00 AM AND 3:00 PM. 180 capsule 1   zaleplon (SONATA) 10 MG capsule Take 1 capsule (10 mg total) by mouth at bedtime as needed for sleep. 30 capsule 4    No current facility-administered medications for this visit.     Musculoskeletal: Strength & Muscle Tone: within normal limits Gait & Station: normal Patient leans: N/A  Psychiatric Specialty Exam: Review of Systems  Psychiatric/Behavioral:  Negative for agitation, behavioral problems, confusion, decreased concentration, dysphoric mood, hallucinations, self-injury, sleep disturbance and suicidal ideas. The patient is not nervous/anxious and is not  hyperactive.   All other systems reviewed and are negative.  Blood pressure 121/83, pulse 80, temperature 98.1 F (36.7 C), temperature source Temporal, weight 224 lb 9.6 oz (101.9 kg).Body mass index is 37.38 kg/m.  General Appearance: Casual  Eye Contact:  Fair  Speech:  Clear and Coherent  Volume:  Normal  Mood:  Euthymic  Affect:  Congruent  Thought Process:  Goal Directed and Descriptions of Associations: Intact  Orientation:  Full (Time, Place, and Person)  Thought Content: Logical   Suicidal Thoughts:  No  Homicidal Thoughts:  No  Memory:  Immediate;   Fair Recent;   Fair Remote;   Fair  Judgement:  Fair  Insight:  Good  Psychomotor Activity:  Normal  Concentration:  Concentration: Fair and Attention Span: Fair  Recall:  [employer] of Knowledge: Fair  Language: Fair  Akathisia:  No  Handed:  Right  AIMS (if indicated): not done  Assets:  Communication Skills Desire for Improvement Housing Social Support  ADL's:  Intact  Cognition: WNL  Sleep:  Fair   Screenings: [place] Office Visit from 10/01/2020 in [facility] Total Score 0      [place] Office Visit from 10/01/2020 in [facility]  Total GAD-7 Score [place] Visit from 05/22/2020 in [facility]  Total Score (max 30 points ) 29      PHQ2-9    [place] Visit from 02/13/2021 in [facility] from 10/01/2020 in [facility] from 09/09/2020 in [facility] from 07/29/2020 in [place] Video Visit from 04/23/2020 in [facility]  PHQ-2 Total Score 0 0 0 0 0  PHQ-9 Total Score -- 0 -- -- --      [place] Visit from 02/13/2021 in [facility] from 10/01/2020 in [facility] from 09/09/2020 in [place] No Risk No Risk No Risk        Assessment and Plan: GODDESS GEBBIA is a 71 year old Caucasian female, retired, married, has a history of depression, multiple medical problems including obstructive sleep apnea with CPAP was evaluated in office today.  Patient is currently stable.  Plan as noted below.  Plan MDD in remission Venlafaxine extended release 75 mg p.o. twice daily Wellbutrin XL 300 mg p.o. daily Continue CBT with Ms. Christina Hussami  Primary insomnia-stable Continue Sonata 10 mg p.o. nightly Provided prescription with refills today. Reviewed [place] PMP aware  History of ADHD-stable Had neuropsychological testing done per Dr.Zussman- 04/23/2020. Memory problems currently stable.  Continue lifestyle modification.  Follow-up in clinic in 4 to 5 months or sooner in the office.  This note was generated in part or whole with voice recognition software. Voice recognition is usually quite accurate but there are transcription errors that can and very often do occur. I apologize for any typographical errors that were not detected and corrected.       Ursula Alert, MD 02/14/2021, 1:44 PM

## 2021-02-14 ENCOUNTER — Ambulatory Visit
Admission: RE | Admit: 2021-02-14 | Discharge: 2021-02-14 | Disposition: A | Discharge status: Home / Self Care | Payer: Medicare PPO | Source: Ambulatory Visit | Attending: Internal Medicine | Admitting: Internal Medicine

## 2021-02-14 DIAGNOSIS — Z1231 Encounter for screening mammogram for malignant neoplasm of breast: Secondary | ICD-10-CM

## 2021-03-13 ENCOUNTER — Other Ambulatory Visit: Payer: Self-pay

## 2021-03-13 ENCOUNTER — Ambulatory Visit (INDEPENDENT_AMBULATORY_CARE_PROVIDER_SITE_OTHER): Payer: Medicare PPO | Admitting: Licensed Clinical Social Worker

## 2021-03-13 DIAGNOSIS — F3342 Major depressive disorder, recurrent, in full remission: Secondary | ICD-10-CM

## 2021-03-14 NOTE — Progress Notes (Signed)
° °  THERAPIST PROGRESS NOTE  Session Time: 2-3p  Participation Level: Active  Behavioral Response: NeatAlertAnxious  Type of Therapy: Individual Therapy  Treatment Goals addressed:  Problem: Reduce overall frequency, intensity, and duration of the anxiety so that daily functioning is not impaired.  Goal: LTG: Patient will score less than 5 on the Generalized Anxiety Disorder 7 Scale (GAD-7) Outcome: Progressing  Goal: STG: Patient will participate in at least 80% of scheduled individual psychotherapy sessions Outcome: Progressing  Interventions:  Intervention: Encourage family support Intervention: Continue cognitive behavioral therapy for structured daily activities and daily behavior management Intervention: Perform motivational interviewing regarding physical activity   Summary: Lisa Chambers is a 71 y.o. female who presents with improving symptoms related to adjustment disorder diagnosis. Pt reports that overall mood is stable and that she is managing situational stressors well. .    Reviewed coping skills to manage mood changes and anxiety.  Continued recommendations are as follows: self care behaviors, positive social engagements, focusing on overall work/home/life balance, and focusing on positive physical and emotional wellness.   Suicidal/Homicidal: No  Therapist Response: Pt is continuing to apply interventions learned in session into daily life situations. Pt is currently on track to meet goals utilizing interventions mentioned above. Personal growth and progress noted. Treatment to continue as indicated.   Plan: Return again in 4 weeks.  Diagnosis: Axis I: Adjustment Disorder with Anxiety    Axis II: No diagnosis   [place], LCSW 03/14/2021

## 2021-03-14 NOTE — Plan of Care (Signed)
°  Problem: Reduce overall frequency, intensity, and duration of the anxiety so that daily functioning is not impaired. Goal: LTG: Patient will score less than 5 on the Generalized Anxiety Disorder 7 Scale (GAD-7) Outcome: Progressing Goal: STG: Patient will participate in at least 80% of scheduled individual psychotherapy sessions Outcome: Progressing Intervention: Encourage family support Intervention: Continue cognitive behavioral therapy for structured daily activities and daily behavior management Intervention: Perform motivational interviewing regarding physical activity

## 2021-04-22 ENCOUNTER — Ambulatory Visit: Payer: Medicare PPO | Admitting: Psychology

## 2021-04-30 ENCOUNTER — Ambulatory Visit (INDEPENDENT_AMBULATORY_CARE_PROVIDER_SITE_OTHER): Payer: Medicare PPO | Admitting: Licensed Clinical Social Worker

## 2021-04-30 DIAGNOSIS — F4320 Adjustment disorder, unspecified: Secondary | ICD-10-CM

## 2021-04-30 NOTE — Plan of Care (Signed)
?  Problem: Reduce overall frequency, intensity, and duration of the anxiety so that daily functioning is not impaired. ?Goal: LTG: Patient will score less than 5 on the Generalized Anxiety Disorder 7 Scale (GAD-7) ?Outcome: Progressing ?Goal: STG: Patient will participate in at least 80% of scheduled individual psychotherapy sessions ?Outcome: Progressing ?Intervention: Encourage self-care activities ?Intervention: REVIEW PLEASE SKILLS (TREAT PHYSICAL ILLNESS, BALANCE EATING, AVOID MOOD-ALTERING SUBSTANCES, BALANCE SLEEP AND GET EXERCISE) WITH Coralyn Mark ?Intervention: Assist with relaxation techniques, as appropriate (deep breathing exercises, meditation, guided imagery) ?  ?

## 2021-04-30 NOTE — Progress Notes (Signed)
? ?  THERAPIST PROGRESS NOTE ? ?Session Time: 2-3p ? ?Participation Level: Active ? ?Behavioral Response: NeatAlertEuthymic ? ?Type of Therapy: Individual Therapy ? ?Treatment Goals addressed: Problem: Reduce overall frequency, intensity, and duration of the anxiety so that daily functioning is not impaired. ?Goal: LTG: Patient will score less than 5 on the Generalized Anxiety Disorder 7 Scale (GAD-7) ?Outcome: Progressing ? ?Goal: STG: Patient will participate in at least 80% of scheduled individual psychotherapy sessions ?Outcome: Progressing ? ?ProgressTowards Goals: Progressing ? ?Interventions: CBT and Psychosocial Skills: SAM[employer] "creating a healthier life" ?Intervention: Encourage self-care activities ? ?Intervention: REVIEW PLEASE SKILLS (TREAT PHYSICAL ILLNESS, BALANCE EATING, AVOID MOOD-ALTERING SUBSTANCES, BALANCE SLEEP AND GET EXERCISE) WITH Coralyn Mark ? ?Intervention: Assist with relaxation techniques, as appropriate (deep breathing exercises, meditation, guided imagery) ?  ? ?Summary: Lisa Chambers is a 71 y.o. female who presents with improving symptoms related to adjustment disorder diagnosis and depression.  ? ?Allowed pt to explore and express thoughts and feelings associated with recent life situations and external stressors. Pt recently visited son in [place] and was very happy to see him, his family, and children. Allowed pt to explore relationships with son and his family members. Explored pts current journey with wellness--discussed current interventions and ways that pt can make different choices/behaviors. Provided SAMSA "Creating a Healthier Life" packet for pt. Discussed ways to help/improve motivation and initiative. ? ?Continued recommendations are as follows: self care behaviors, positive social engagements, focusing on overall work/home/life balance, and focusing on positive physical and emotional wellness.  ? ? ?Suicidal/Homicidal: No ? ?Therapist Response: Pt is continuing to apply  interventions learned in session into daily life situations. Pt is currently on track to meet goals utilizing interventions mentioned above. Personal growth and progress noted. Treatment to continue as indicated.  ? ?Lisa Chambers is currently setting more personal goals with herself and recognizing baby steps that can  ? ?Plan: Return again in 4 weeks. ? ?Diagnosis: Adjustment disorder, unspecified type ? ?Collaboration of Care: Other pt encouraged to continue care with psychiatrist of record, Dr Shea Evans ? ?Patient/Guardian was advised Release of Information must be obtained prior to any record release in order to collaborate their care with an outside provider. Patient/Guardian was advised if they have not already done so to contact the registration department to sign all necessary forms in order for [place] to release information regarding their care.  ? ?Consent: Patient/Guardian gives verbal consent for treatment and assignment of benefits for services provided during this visit. Patient/Guardian expressed understanding and agreed to proceed.  ? ?Cantrell Larouche R Arisbel Maione, LCSW ?05/01/2021 ? ?

## 2021-06-25 ENCOUNTER — Encounter: Payer: Self-pay | Admitting: Psychiatry

## 2021-06-25 ENCOUNTER — Ambulatory Visit (INDEPENDENT_AMBULATORY_CARE_PROVIDER_SITE_OTHER): Payer: Medicare PPO | Admitting: Psychiatry

## 2021-06-25 VITALS — BP 123/82 | HR 74

## 2021-06-25 DIAGNOSIS — F5101 Primary insomnia: Secondary | ICD-10-CM | POA: Diagnosis not present

## 2021-06-25 DIAGNOSIS — F3342 Major depressive disorder, recurrent, in full remission: Secondary | ICD-10-CM

## 2021-06-25 MED ORDER — VENLAFAXINE HCL ER 75 MG PO CP24: 75.0000 mg | ORAL_CAPSULE | Freq: Every day | ORAL | 0 refills | Status: DC

## 2021-06-25 MED ORDER — BUPROPION HCL ER (XL) 300 MG PO TB24: ORAL_TABLET | ORAL | 1 refills | Status: DC

## 2021-06-25 NOTE — Progress Notes (Signed)
Lisa Chambers OP Progress Note  06/25/2021 2:04 PM Lisa Chambers  MRN:  [id number]  Chief Complaint:  Chief Complaint  Patient presents with   Follow-up: 71 year old Caucasian female, married, retired, currently stable, presented for medication management.   HPI: Lisa Chambers is a 71 year old Caucasian female, married, retired, lives in [place], has a history of depression, ADHD per history, obstructive sleep apnea on CPAP, asthma, hypertension, history of thyroid nodule, GERD was evaluated in office today.  Patient today reports she was able to take a short break, was able to go to [place] and spent some time with her aunt.  That helped her to feel better.  Patient reports overall mood symptoms are stable.  Has not had any significant sadness, crying spells.  Reports she feels motivated, denies any concentration problems.  Patient reports sleep is good.  Denies any suicidality, homicidality or perceptual disturbances.  Currently compliant on venlafaxine and Wellbutrin.  Agreeable to being tapered off of the venlafaxine to a lower dosage.  Denies any other concerns today.  Visit Diagnosis:    ICD-10-CM   1. MDD (major depressive disorder), recurrent, in full remission ([place])  F33.42 venlafaxine XR (EFFEXOR-XR) 75 MG 24 hr capsule    buPROPion (WELLBUTRIN XL) 300 MG 24 hr tablet    2. Primary insomnia  F51.01       Past Psychiatric History: Reviewed past psychiatric history from progress note on 12/13/2019.  Past trials of Ambien, Abilify, Effexor, Adderall, Wellbutrin, Viibryd.  Past Medical History:  Past Medical History:  Diagnosis Date   Asthma    Cancer ([place])    skin   GERD (gastroesophageal reflux disease)    Hearing loss    HTN (hypertension)    OSA on CPAP    Thyroid disease     Past Surgical History:  Procedure Laterality Date   [place], 2007   REPLACEMENT TOTAL KNEE Bilateral 2017   TUBAL LIGATION  1994    Family  Psychiatric History: Reviewed family psychiatric history from progress note on 12/13/2019.  Family History:  Family History  Problem Relation Age of Onset   Cancer Mother        breast   Cancer Father        lung   Pneumonia Father    Cancer Maternal Grandmother        colon   Alcohol abuse Brother    Diabetes Brother    Heart attack Brother    COPD Brother    Other Brother        sleep disorder    Social History: Reviewed social history from progress note on 12/13/2019. Social History   Socioeconomic History   Marital status: Married    Spouse name: Lisa Chambers   Number of children: 2   Years of education: Not on file   Highest education level: Bachelor's degree (e.g., BA, AB, BS)  Occupational History    Comment: retired  Tobacco Use   Smoking status: Never   Smokeless tobacco: Never  Substance and Sexual Activity   Alcohol use: No   Drug use: No   Sexual activity: Not on file  Other Topics Concern   Not on file  Social History Narrative   Lives with husband   Caffeine- diet coke 2 daily   Social Determinants of Health   Financial Resource Strain: Not on file  Food Insecurity: Not on file  Transportation Needs: Not on file  Physical Activity:  Not on file  Stress: Not on file  Social Connections: Not on file    Allergies:  Allergies  Allergen Reactions   Erythromycin Other (See Comments)    GI upset GI upset    Levofloxacin Other (See Comments)    Joint pain Joint pain    Tetracycline Other (See Comments)    GI upset GI upset    Aspirin Swelling and Hives    Lips only.    Metabolic Disorder Labs: No results found for: HGBA1C, MPG No results found for: PROLACTIN No results found for: CHOL, TRIG, HDL, CHOLHDL, VLDL, LDLCALC No results found for: TSH  Therapeutic Level Labs: No results found for: LITHIUM No results found for: VALPROATE No components found for:  CBMZ  Current Medications: Current Outpatient Medications  Medication Sig  Dispense Refill   albuterol (VENTOLIN HFA) 108 (90 Base) MCG/ACT inhaler Inhale into the lungs.     AMYLASE-LIPASE-PROTEASE PO Take by mouth.     Cholecalciferol 25 MCG (1000 UT) capsule Take by mouth.     cycloSPORINE (RESTASIS) 0.05 % ophthalmic emulsion Restasis 0.05 % eye drops in a dropperette  INT 1 GTT IN OU BID UTD     estradiol (ESTRACE) 0.1 MG/GM vaginal cream      estradiol (VIVELLE-DOT) 0.05 MG/24HR patch      famotidine (PEPCID) 40 MG tablet Take by mouth.     fexofenadine (ALLEGRA) 180 MG tablet Take by mouth.     fluticasone (FLONASE) 50 MCG/ACT nasal spray 2 sprays daily.      Fluticasone-Salmeterol (ADVAIR HFA IN) Inhale into the lungs 2 (two) times daily.     hydrochlorothiazide (HYDRODIURIL) 25 MG tablet Take 1 tablet by mouth daily.     L-Lysine 1000 MG TABS Take 1 tablet by mouth daily.     meloxicam (MOBIC) 15 MG tablet Take by mouth.     Omega-3 Fatty Acids (FISH OIL) 1200 MG CAPS Take by mouth daily in the afternoon.     Probiotic Product (PROBIOTIC PO) Take by mouth.     progesterone (PROMETRIUM) 100 MG capsule Take by mouth.     zaleplon (SONATA) 10 MG capsule Take 1 capsule (10 mg total) by mouth at bedtime as needed for sleep. 30 capsule 4   buPROPion (WELLBUTRIN XL) 300 MG 24 hr tablet TAKE 1 TABLET BY MOUTH EVERY 24 HOURS 90 tablet 1   Fexofenadine HCl (ALLEGRA PO) Take by mouth as needed. (Patient not taking: Reported on 06/25/2021)     MAXITROL 3.5-10000-0.1 OINT SMARTSIG:0.25 Inch(es) In Eye(s) Every Night (Patient not taking: Reported on 06/25/2021)     montelukast (SINGULAIR) 10 MG tablet Take 1 tablet by mouth daily. (Patient not taking: Reported on 06/25/2021)     venlafaxine XR (EFFEXOR-XR) 75 MG 24 hr capsule Take 1 capsule (75 mg total) by mouth daily with breakfast. 90 capsule 0   No current facility-administered medications for this visit.     Musculoskeletal: Strength & Muscle Tone: within normal limits Gait & Station: normal Patient leans:  N/A  Psychiatric Specialty Exam: Review of Systems  Psychiatric/Behavioral:  Negative for agitation, behavioral problems, confusion, decreased concentration, dysphoric mood, hallucinations, self-injury, sleep disturbance and suicidal ideas. The patient is not nervous/anxious and is not hyperactive.   All other systems reviewed and are negative.  Blood pressure 123/82, pulse 74.There is no height or weight on file to calculate BMI.  General Appearance: Casual  Eye Contact:  Good  Speech:  Clear and Coherent  Volume:  Normal  Mood:  Euthymic  Affect:  Congruent  Thought Process:  Goal Directed and Descriptions of Associations: Intact  Orientation:  Full (Time, Place, and Person)  Thought Content: Logical   Suicidal Thoughts:  No  Homicidal Thoughts:  No  Memory:  Immediate;   Fair Recent;   Fair Remote;   Fair  Judgement:  Fair  Insight:  Fair  Psychomotor Activity:  Normal  Concentration:  Concentration: Fair and Attention Span: Fair  Recall:  [employer] of Knowledge: Fair  Language: Fair  Akathisia:  No  Handed:  Right  AIMS (if indicated): done,0  Assets:  Communication Skills Desire for Improvement Housing Social Support  ADL's:  Intact  Cognition: WNL  Sleep:  Fair   Screenings: [place] Office Visit from 10/01/2020 in [facility] Total Score 0      [place] Office Visit from 10/01/2020 in [facility]  Total GAD-7 Score [place] Visit from 05/22/2020 in [facility]  Total Score (max 30 points ) 29      PHQ2-9    [place] Visit from 06/25/2021 in [facility] from 03/13/2021 in [facility] Visit from 02/13/2021 in [facility] Visit from 10/01/2020 in [facility]  from 09/09/2020 in [facility]  PHQ-2 Total Score 0 0 0 0 0  PHQ-9 Total Score -- -- -- 0 --      [place] Visit from 06/25/2021 in [facility] from 03/13/2021 in [facility] Visit from 02/13/2021 in [facility] No Risk No Risk No Risk        Assessment and Plan: ADELLE ZACHAR is a 71 year old Caucasian female, retired, married, has a history of depression, multiple medical problems including obstructive sleep apnea with CPAP was evaluated in office today.  Patient is currently stable, will benefit from being tapered off of venlafaxine.  Discussed plan as noted below.  Plan MDD in remission Will reduce venlafaxine extended release to 75 mg p.o. daily Long-term plan is to taper down to monotherapy.  Provided education about withdrawal symptoms while being tapered off of the venlafaxine.  If she notices any worsening withdrawal symptoms or mood symptoms she could go back to venlafaxine extended release 75 mg p.o. twice daily and contact the clinic for further management. Wellbutrin XL 300 mg p.o. daily Continue CBT with Ms. Lisa Chambers as needed  Primary insomnia-stable Sonata 10 mg p.o. nightly   Follow-up in clinic in 3 months or sooner if needed.  This note was generated in part or whole with voice recognition software. Voice recognition is usually quite accurate but there are transcription errors that can and very often do occur. I apologize for any typographical errors that were not detected and corrected.      Lisa Alert, Chambers 06/26/2021, 8:21 AM

## 2021-06-25 NOTE — Progress Notes (Deleted)
Error

## 2021-06-30 ENCOUNTER — Ambulatory Visit: Payer: Medicare PPO | Admitting: Licensed Clinical Social Worker

## 2021-07-16 ENCOUNTER — Ambulatory Visit (INDEPENDENT_AMBULATORY_CARE_PROVIDER_SITE_OTHER): Payer: Medicare PPO | Admitting: Licensed Clinical Social Worker

## 2021-07-16 DIAGNOSIS — F4320 Adjustment disorder, unspecified: Secondary | ICD-10-CM

## 2021-07-16 NOTE — Plan of Care (Signed)
  Problem: Reduce overall frequency, intensity, and duration of the anxiety so that daily functioning is not impaired. Goal: LTG: Patient will score less than 5 on the Generalized Anxiety Disorder 7 Scale (GAD-7) Outcome: Progressing Goal: STG: Patient will participate in at least 80% of scheduled individual psychotherapy sessions Outcome: Progressing Intervention: Encourage patient to identify triggers Note: Assisted with identification Intervention: Work with patient to identify the major components of a recent episode of anxiety: physical symptoms, major thoughts and images, and major behaviors they experienced Note: Reviewed and discussed positive coping strategies

## 2021-07-16 NOTE — Progress Notes (Addendum)
   THERAPIST PROGRESS NOTE  Session Time: 2-3p  [facility] in office visit for patient and LCSW clinician  Participation Level: Active  Behavioral Response: NeatAlertEuthymic  Type of Therapy: Individual Therapy  Treatment Goals addressed: Problem: Reduce overall frequency, intensity, and duration of the anxiety so that daily functioning is not impaired. Goal: LTG: Patient will score less than 5 on the Generalized Anxiety Disorder 7 Scale (GAD-7) Outcome: Progressing Goal: STG: Patient will participate in at least 80% of scheduled individual psychotherapy sessions Outcome: Progressing Intervention: Encourage patient to identify triggers Note: Assisted with identification Intervention: Work with patient to identify the major components of a recent episode of anxiety: physical symptoms, major thoughts and images, and major behaviors they experienced Note: Reviewed and discussed positive coping strategies   ProgressTowards Goals: Progressing  Interventions: CBT  Summary: JORGINA BINNING is a 71 y.o. female who presents with improving symptoms related to adjustment disorder diagnosis and depression.   Allowed pt to explore and express thoughts and feelings associated with recent life situations and external stressors. Patient reports that she is being mindful and intentional about incorporating Wellness activities into her daily routine. Patient reports that she has made two trips to [place] to visit with her children and grandchildren. Patient reports that she had a lot of concerns about differences in parenting between her generation and younger generation. Discussed importance of setting boundaries with adult children and setting limits with adult children to avoid conflict. Allowed patient to explore individual scenarios and situations that triggered stress and anxiety while she was visiting family in [place]. Patient also expressed happy moments and positive experiences that she spent with her  children and grandchildren.  Overall patient reports a decrease in any depression or anxiety symptoms that she has experienced in the past period encouraged continuing focus on self-care hand Wellness associated behaviors.  Continued recommendations are as follows: self care behaviors, positive social engagements, focusing on overall work/home/life balance, and focusing on positive physical and emotional wellness.    Suicidal/Homicidal: No  Therapist Response: Pt is continuing to apply interventions learned in session into daily life situations. Pt is currently on track to meet goals utilizing interventions mentioned above. Personal growth and progress noted. Treatment to continue as indicated.   Michelena is currently setting more personal goals with herself and recognizing baby steps that can   Plan: Return again in 4 weeks.  Diagnosis: Adjustment disorder, unspecified type  Collaboration of Care: Other pt encouraged to continue care with psychiatrist of record, Dr Shea Evans  Patient/Guardian was advised Release of Information must be obtained prior to any record release in order to collaborate their care with an outside provider. Patient/Guardian was advised if they have not already done so to contact the registration department to sign all necessary forms in order for [place] to release information regarding their care.   Consent: Patient/Guardian gives verbal consent for treatment and assignment of benefits for services provided during this visit. Patient/Guardian expressed understanding and agreed to proceed.   [place], LCSW 07/16/2021

## 2021-07-18 ENCOUNTER — Other Ambulatory Visit: Payer: Self-pay | Admitting: Psychiatry

## 2021-07-18 DIAGNOSIS — F5101 Primary insomnia: Secondary | ICD-10-CM

## 2021-07-18 DIAGNOSIS — F3342 Major depressive disorder, recurrent, in full remission: Secondary | ICD-10-CM

## 2021-08-06 ENCOUNTER — Telehealth: Payer: Self-pay

## 2021-08-06 DIAGNOSIS — F3342 Major depressive disorder, recurrent, in full remission: Secondary | ICD-10-CM

## 2021-08-06 MED ORDER — VENLAFAXINE HCL ER 75 MG PO CP24: 75.0000 mg | ORAL_CAPSULE | Freq: Two times a day (BID) | ORAL | 1 refills | Status: DC

## 2021-08-06 NOTE — Telephone Encounter (Signed)
Medication problem - Telephone call with pt, after she left a message she had gone down on her Venlafaxine XR to 75 mg, one a day for approximately a week. Pt. stated this made her feel like she had "brain fog" and not as well so then she returned to taking 2 a day as was previously prescribed.  Patient requested Dr. Shea Evans go back up on her Venlafaxine XR to 75 mg, one twice a day and to send in a new order to her [facility].  Agreed to send request to Dr. Shea Evans for patient with her reported issues when she attempted for a week to take a lower dosage of one per day.

## 2021-08-06 NOTE — Telephone Encounter (Signed)
Medication management - Telephone call with patient to inform Dr. Shea Evans approved her going back up to Venlafaxine XR 75 mg, one twice a day and had sent in a new order to her [facility] for her to fill for a 90 day supply when needed. Patient  stated understanding and will call back if any issues.

## 2021-08-06 NOTE — Telephone Encounter (Signed)
I have increased venlafaxine extended release to 75 mg twice a day.  I have sent the prescription to [facility] drug pharmacy.

## 2021-09-18 ENCOUNTER — Ambulatory Visit (HOSPITAL_COMMUNITY): Payer: Medicare PPO | Admitting: Licensed Clinical Social Worker

## 2021-09-18 DIAGNOSIS — F4320 Adjustment disorder, unspecified: Secondary | ICD-10-CM

## 2021-09-18 NOTE — Progress Notes (Signed)
   THERAPIST PROGRESS NOTE  Session Time: [place] in office visit for patient and LCSW clinician  Participation Level: Active  Behavioral Response: NeatAlertAnxious  Type of Therapy: Individual Therapy  Treatment Goals addressed: PProblem: Reduce overall frequency, intensity, and duration of the anxiety so that daily functioning is not impaired. Goal: LTG: Patient will score less than 5 on the Generalized Anxiety Disorder 7 Scale (GAD-7) Outcome: Progressing Goal: STG: Patient will participate in at least 80% of scheduled individual psychotherapy sessions Outcome: Progressing Intervention: Encourage verbalization of feelings/concerns/expectations Note: Allowed pt to identify and explored Intervention: Assist with relaxation techniques, as appropriate (deep breathing exercises, meditation, guided imagery) Note: Reviewed      ProgressTowards Goals: Progressing  Interventions: CBT  Summary: Lisa Chambers is a 71 y.o. female who presents with improving symptoms related to adjustment disorder diagnosis.  Pt reports fewer anxiety and depression symptoms.  Pt reports good quality and quantity of sleep.   Allowed pt to explore and express thoughts and feelings associated with recent life situations and external stressors. Pt reports that caregiver stress is continuing to be a big external stressor for her. Pt is managing by focusing on her own health/wellness and engaging more in self care behaviors. Pt reports that her husband is going to a new physician based out of [place] and pt feels this is a promising change, since medications that he has tried recently have not changed. Pt feels that his symptoms are worsening, which is an anxiety trigger for pt.   Explored relationship with family members--pt has a granddaughter going to [place]--pt is worried about how her daughter is going to handle her child leaving to go to college. Encouraged pt to be a  positive support system for daughter.  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 Pt reflects understanding and is always willing to cooperate and engage.   Continued recommendations are as follows: self care behaviors, positive social engagements, focusing on overall work/home/life balance, and focusing on positive physical and emotional wellness.    Suicidal/Homicidal: No  Therapist Response: Pt is continuing to apply interventions learned in session into daily life situations. Pt is currently on track to meet goals utilizing interventions mentioned above. Personal growth and progress noted. Treatment to continue as indicated.   Lisa Chambers is currently setting more personal goals with herself and recognizing baby steps that can   Plan: Return again in 4 weeks.  Diagnosis:  Encounter Diagnosis  Name Primary?   Adjustment disorder, unspecified type Yes    Collaboration of Care: Other pt encouraged to continue care with psychiatrist of record, Dr Shea Evans  Patient/Guardian was advised Release of Information must be obtained prior to any record release in order to collaborate their care with an outside provider. Patient/Guardian was advised if they have not already done so to contact the registration department to sign all necessary forms in order for [place] to release information regarding their care.   Consent: Patient/Guardian gives verbal consent for treatment and assignment of benefits for services provided during this visit. Patient/Guardian expressed understanding and agreed to proceed.   [place], LCSW 09/18/2021

## 2021-09-19 NOTE — Plan of Care (Signed)
  Problem: Reduce overall frequency, intensity, and duration of the anxiety so that daily functioning is not impaired. Goal: LTG: Patient will score less than 5 on the Generalized Anxiety Disorder 7 Scale (GAD-7) Outcome: Progressing Goal: STG: Patient will participate in at least 80% of scheduled individual psychotherapy sessions Outcome: Progressing Intervention: Encourage verbalization of feelings/concerns/expectations Note: Allowed pt to identify and explored Intervention: Assist with relaxation techniques, as appropriate (deep breathing exercises, meditation, guided imagery) Note: Reviewed

## 2021-09-29 ENCOUNTER — Encounter: Payer: Self-pay | Admitting: Psychiatry

## 2021-09-29 ENCOUNTER — Ambulatory Visit (INDEPENDENT_AMBULATORY_CARE_PROVIDER_SITE_OTHER): Payer: Medicare PPO | Admitting: Psychiatry

## 2021-09-29 VITALS — BP 146/90 | HR 67 | Temp 97.9°F | Wt 226.0 lb

## 2021-09-29 DIAGNOSIS — F5101 Primary insomnia: Secondary | ICD-10-CM

## 2021-09-29 DIAGNOSIS — R6889 Other general symptoms and signs: Secondary | ICD-10-CM | POA: Diagnosis not present

## 2021-09-29 DIAGNOSIS — F3342 Major depressive disorder, recurrent, in full remission: Secondary | ICD-10-CM

## 2021-09-29 NOTE — Progress Notes (Signed)
[facility] MD OP Progress Note  09/29/2021 1:01 PM Lisa Chambers  MRN:  [id number]  Chief Complaint:  Chief Complaint  Patient presents with   Follow-up: 71 year old Caucasian female, married, retired, with history of depression, insomnia, presented for medication management.   HPI: Lisa Chambers is a 71 year old Caucasian female, married, retired, lives in [place], has a history of MDD, primary insomnia, obstructive sleep apnea on CPAP, asthma, hypertension, history of thyroid nodule, GERD was evaluated in office today.  Patient today reports she is currently doing fairly well on the current medication regimen.  Tried to wean off the venlafaxine as discussed last visit, started taking only 1 time dosage of venlafaxine 75 mg in the morning.  Patient however reports she started having flulike symptoms, and felt bad.  She hence had to go back on the twice a day dosage.  Patient continues to be compliant on the Wellbutrin.  Denies side effects.  Reports sleep as good.  Currently on the [place].  Denies side effects.  Patient does report psychosocial stressors of her husband who struggles with Parkinson's disease and memory problems.  She reports she however is learning to deal with the changes and is also currently working with her therapist.  Therapy sessions are beneficial.  Patient denies any suicidality, homicidality or perceptual disturbances.  Patient with very low blood pressure reading in session today also reported possible shortness of breath when she walked into the office.  Patient was able to use a as needed albuterol in session which helped.  Blood pressure was repeated, trending up, patient agrees to follow up with primary care provider.  Visit Diagnosis:    ICD-10-CM   1. MDD (major depressive disorder), recurrent, in full remission ([place])  F33.42     2. Primary insomnia  F51.01     3. Alteration in blood pressure  R68.89       Past Psychiatric History: Reviewed past  psychiatric history from progress note on 12/13/2019.  Past trials of Ambien, Abilify, Effexor, Adderall, Wellbutrin, Viibryd.  Past Medical History:  Past Medical History:  Diagnosis Date   Asthma    Cancer ([place])    skin   GERD (gastroesophageal reflux disease)    Hearing loss    HTN (hypertension)    OSA on CPAP    Thyroid disease     Past Surgical History:  Procedure Laterality Date   [place], 2007   REPLACEMENT TOTAL KNEE Bilateral 2017   TUBAL LIGATION  1994    Family Psychiatric History: Reviewed family psychiatric history from progress note on 12/13/2019.  Family History:  Family History  Problem Relation Age of Onset   Cancer Mother        breast   Cancer Father        lung   Pneumonia Father    Cancer Maternal Grandmother        colon   Alcohol abuse Brother    Diabetes Brother    Heart attack Brother    COPD Brother    Other Brother        sleep disorder    Social History: Reviewed social history from progress note on 12/13/2019. Social History   Socioeconomic History   Marital status: Married    Spouse name: Lanny Hurst   Number of children: 2   Years of education: Not on file   Highest education level: Bachelor's degree (e.g., BA, AB, BS)  Occupational History  Comment: retired  Tobacco Use   Smoking status: Never   Smokeless tobacco: Never  Substance and Sexual Activity   Alcohol use: No   Drug use: No   Sexual activity: Not on file  Other Topics Concern   Not on file  Social History Narrative   Lives with husband   Caffeine- diet coke 2 daily   Social Determinants of Health   Financial Resource Strain: Not on file  Food Insecurity: Not on file  Transportation Needs: Not on file  Physical Activity: Not on file  Stress: Not on file  Social Connections: Not on file    Allergies:  Allergies  Allergen Reactions   Erythromycin Other (See Comments)    GI upset GI upset    Levofloxacin Other (See  Comments)    Joint pain Joint pain    Tetracycline Other (See Comments)    GI upset GI upset    Aspirin Swelling and Hives    Lips only.    Metabolic Disorder Labs: No results found for: "HGBA1C", "MPG" No results found for: "PROLACTIN" No results found for: "CHOL", "TRIG", "HDL", "CHOLHDL", "VLDL", "LDLCALC" No results found for: "TSH"  Therapeutic Level Labs: No results found for: "LITHIUM" No results found for: "VALPROATE" No results found for: "CBMZ"  Current Medications: Current Outpatient Medications  Medication Sig Dispense Refill   albuterol (VENTOLIN HFA) 108 (90 Base) MCG/ACT inhaler Inhale into the lungs.     AMYLASE-LIPASE-PROTEASE PO Take by mouth.     buPROPion (WELLBUTRIN XL) 300 MG 24 hr tablet TAKE 1 TABLET BY MOUTH EVERY 24 HOURS 90 tablet 1   Cholecalciferol 25 MCG (1000 UT) capsule Take by mouth.     cycloSPORINE (RESTASIS) 0.05 % ophthalmic emulsion Restasis 0.05 % eye drops in a dropperette  INT 1 GTT IN OU BID UTD     estradiol (ESTRACE) 0.1 MG/GM vaginal cream      estradiol (VIVELLE-DOT) 0.05 MG/24HR patch      famotidine (PEPCID) 20 MG tablet Take 20 mg by mouth at bedtime.     Fexofenadine HCl (ALLEGRA PO) Take by mouth as needed.     fluticasone (FLONASE) 50 MCG/ACT nasal spray 2 sprays daily.      Fluticasone-Salmeterol (ADVAIR HFA IN) Inhale into the lungs 2 (two) times daily.     hydrochlorothiazide (HYDRODIURIL) 25 MG tablet Take 1 tablet by mouth daily.     L-Lysine 1000 MG TABS Take 1 tablet by mouth daily.     montelukast (SINGULAIR) 10 MG tablet Take 1 tablet by mouth daily.     neomycin-polymyxin-hydrocortisone (CORTISPORIN) 3.5-10000-1 OTIC suspension 3 drops 3 (three) times daily.     Omega-3 Fatty Acids (FISH OIL) 1200 MG CAPS Take by mouth daily in the afternoon.     omeprazole (PRILOSEC) 40 MG capsule Take 40 mg by mouth daily.     Probiotic Product (PROBIOTIC PO) Take by mouth.     progesterone (PROMETRIUM) 100 MG capsule Take by  mouth.     venlafaxine XR (EFFEXOR-XR) 75 MG 24 hr capsule Take 1 capsule (75 mg total) by mouth 2 (two) times daily with a meal. 180 capsule 1   zaleplon (SONATA) 10 MG capsule TAKE 1 CAPSULE (10 MG TOTAL) BY MOUTH AT BEDTIME AS NEEDED FOR SLEEP. 30 capsule 4   MAXITROL 3.5-10000-0.1 OINT  (Patient not taking: Reported on 09/29/2021)     No current facility-administered medications for this visit.     Musculoskeletal: Strength & Muscle Tone: within normal limits Gait &  Station: normal Patient leans: N/A  Psychiatric Specialty Exam: Review of Systems  Psychiatric/Behavioral: Negative.    All other systems reviewed and are negative.   Blood pressure (!) 146/90, pulse 67, temperature 97.9 F (36.6 C), temperature source Temporal, weight 226 lb (102.5 kg).Body mass index is 37.61 kg/m.  General Appearance: Casual  Eye Contact:  Fair  Speech:  Clear and Coherent  Volume:  Normal  Mood:  Euthymic  Affect:  Congruent  Thought Process:  Goal Directed and Descriptions of Associations: Intact  Orientation:  Full (Time, Place, and Person)  Thought Content: Logical   Suicidal Thoughts:  No  Homicidal Thoughts:  No  Memory:  Immediate;   Fair Recent;   Fair Remote;   Fair  Judgement:  Fair  Insight:  Fair  Psychomotor Activity:  Normal  Concentration:  Concentration: Fair and Attention Span: Fair  Recall:  [employer] of Knowledge: Fair  Language: Fair  Akathisia:  No  Handed:  Right  AIMS (if indicated): done  Assets:  Communication Skills Desire for Improvement Housing Social Support  ADL's:  Intact  Cognition: WNL  Sleep:  Fair   Screenings: [place] Office Visit from 09/29/2021 in [facility] Office Visit from 10/01/2020 in [facility] Total Score 0 0      GAD-7    [place] Visit from 09/29/2021 in [facility] Office Visit from 10/01/2020 in [facility]  Total GAD-7 Score 1 [place] Visit from 05/22/2020 in [facility]  Total Score (max 30 points ) 29      PHQ2-9    [place] Visit from 09/29/2021 in [facility] from 06/25/2021 in [facility] from 03/13/2021 in [facility] from 02/13/2021 in [facility] Visit from 10/01/2020 in [facility]  PHQ-2 Total Score 1 0 0 0 0  PHQ-9 Total Score -- -- -- -- 0      [place] [facility] from 07/16/2021 in [facility] from 06/25/2021 in [facility] Counselor from 03/13/2021 in [facility] No Risk No Risk No Risk        Assessment and Plan: Lisa Chambers is a 71 year old Caucasian female, retired, married, has a history of depression, multiple medical problems including obstructive sleep apnea with CPAP was evaluated in office today.  Patient did not tolerate being tapered off of the venlafaxine and is currently back on her previous dosage.  Patient is currently stable.  Plan as noted below.  Plan MDD in remission Venlafaxine extended release 75 mg p.o. twice daily. Wellbutrin XL 300 mg p.o. daily Continue CBT with Ms. Christina Hussami  Primary insomnia-stable Sonata 10 mg p.o. nightly  Abnormal blood pressure-unstable Patient initially presented with very low blood pressure, possible shortness of breath when she walked into the office.  Patient was able to use albuterol inhaler as needed which helped with her shortness of breath.  Blood pressure was repeated in session and it was elevated.  Patient advised to keep a log of blood pressure and follow up with primary care provider.  Follow-up in  clinic in 3 to 4 months or sooner if needed.  This note was generated in part or whole with voice  recognition software. Voice recognition is usually quite accurate but there are transcription errors that can and very often do occur. I apologize for any typographical errors that were not detected and corrected.     Ursula Alert, MD 09/29/2021, 1:01 PM

## 2021-11-12 ENCOUNTER — Ambulatory Visit (INDEPENDENT_AMBULATORY_CARE_PROVIDER_SITE_OTHER): Payer: Medicare PPO | Admitting: Licensed Clinical Social Worker

## 2021-11-12 DIAGNOSIS — F4320 Adjustment disorder, unspecified: Secondary | ICD-10-CM

## 2021-11-12 DIAGNOSIS — Z636 Dependent relative needing care at home: Secondary | ICD-10-CM

## 2021-11-12 NOTE — Progress Notes (Signed)
  THERAPIST PROGRESS NOTE  Session Time: [place] in office visit for patient and LCSW clinician  Pt provided verbal consent for student, Lucia Gaskins, to be present throughout session.   Participation Level: Active  Behavioral Response: NeatAlertAnxious  Type of Therapy: Individual Therapy  Treatment Goals addressed:     ProgressTowards Goals: Progressing  Interventions: CBT  Summary: Lisa Chambers is a 71 y.o. female who presents with improving symptoms related to adjustment disorder diagnosis.  Pt reports fewer anxiety and depression symptoms.  Pt reports good quality and quantity of sleep.   Allowed pt to explore and express thoughts and feelings associated with recent life situations and external stressors. Discussed continuing caregiver stress--reviewed overall health concerns regarding her husband (Parkinson's) and his care. Pt is his primary caregiver at time of session. Pt feels she is keeping her caregiving duties and self care behaviors in balance.   Discussed recent visit to her son/DIL in [place]. Pt reports that she took care of the three children on days when their mother was working 12 hr shifts. Pt states she took the children to school and to their after school extracurricular activities. Pt expressed freely thoughts and feelings triggered by this visit. "I don't know if I will be able to do this again".  Pt states that in addition to all the activities, she also planned some closet re-organizing for the girls which was very time consuming. Overall, pt is happy with progress.  Encouraged pt to focus on self care now that she is back in [place].  Continued recommendations are as follows: self care behaviors, positive social engagements, focusing on overall work/home/life balance, and focusing on positive physical and emotional wellness.   Suicidal/Homicidal: No  Therapist Response: Pt is continuing to apply interventions  learned in session into daily life situations. Pt is currently on track to meet goals utilizing interventions mentioned above. Personal growth and progress noted. Treatment to continue as indicated.   Plan: Return again in 4 weeks.  Diagnosis:  Encounter Diagnoses  Name Primary?   Adjustment disorder, unspecified type Yes   Caregiver stress    Collaboration of Care: Other pt encouraged to continue care with psychiatrist of record, Dr Shea Evans  Patient/Guardian was advised Release of Information must be obtained prior to any record release in order to collaborate their care with an outside provider. Patient/Guardian was advised if they have not already done so to contact the registration department to sign all necessary forms in order for [place] to release information regarding their care.   Consent: Patient/Guardian gives verbal consent for treatment and assignment of benefits for services provided during this visit. Patient/Guardian expressed understanding and agreed to proceed.   [place], LCSW 11/12/2021

## 2021-12-18 ENCOUNTER — Other Ambulatory Visit: Payer: Self-pay | Admitting: Psychiatry

## 2021-12-18 DIAGNOSIS — F5101 Primary insomnia: Secondary | ICD-10-CM

## 2021-12-18 DIAGNOSIS — F3342 Major depressive disorder, recurrent, in full remission: Secondary | ICD-10-CM

## 2022-01-15 ENCOUNTER — Ambulatory Visit (HOSPITAL_COMMUNITY): Payer: Medicare PPO | Admitting: Licensed Clinical Social Worker

## 2022-01-19 ENCOUNTER — Ambulatory Visit (INDEPENDENT_AMBULATORY_CARE_PROVIDER_SITE_OTHER): Payer: Medicare PPO | Admitting: Psychiatry

## 2022-01-19 ENCOUNTER — Encounter: Payer: Self-pay | Admitting: Psychiatry

## 2022-01-19 VITALS — BP 136/84 | HR 82 | Temp 97.6°F | Ht 65.0 in | Wt 224.2 lb

## 2022-01-19 DIAGNOSIS — F5101 Primary insomnia: Secondary | ICD-10-CM | POA: Diagnosis not present

## 2022-01-19 DIAGNOSIS — F3342 Major depressive disorder, recurrent, in full remission: Secondary | ICD-10-CM | POA: Diagnosis not present

## 2022-01-19 MED ORDER — VENLAFAXINE HCL ER 75 MG PO CP24: 75.0000 mg | ORAL_CAPSULE | Freq: Two times a day (BID) | ORAL | 1 refills | Status: DC

## 2022-01-19 MED ORDER — BUPROPION HCL ER (XL) 300 MG PO TB24: 300.0000 mg | ORAL_TABLET | Freq: Every morning | ORAL | 1 refills | Status: DC

## 2022-01-19 NOTE — Progress Notes (Signed)
[facility] MD OP Progress Note  01/19/2022 11:54 AM Lisa Chambers  MRN:  [id number]  Chief Complaint:  Chief Complaint  Patient presents with   Follow-up   Medication Refill   Depression   HPI: Lisa Chambers is a 71 year old Caucasian female, married, retired, lives in [place], has a history of MDD, primary insomnia, obstructive sleep apnea on CPAP, asthma, hypertension, history of thyroid nodule, GERD was evaluated in office today.  Patient today reports she is currently doing well with regards to her mood.  Denies any significant sadness, anxiety.  Reports she has been sleeping well.  Reports she has been getting out more, spending time with friends, meeting them up for lunch, does a lot of reading.  Patient appeared to be alert, oriented to person place time and situation.  Was able to do serial sevens, attention and focus seem to be good.  Recall appeared to be limited, 3 word memory immediately 3 out of 3 after 5 minutes 1 out of 3.  Patient with mildly elevated blood pressure reading today, currently compliant on her antihypertensive, agrees to keep a log of it and follow up with primary care provider.  Currently completing a course of Augmentin for upper respiratory tract infection symptoms.  Denies any suicidality, homicidality or perceptual disturbances.  Denies any other concerns today.  Visit Diagnosis:    ICD-10-CM   1. MDD (major depressive disorder), recurrent, in full remission ([place])  F33.42 buPROPion (WELLBUTRIN XL) 300 MG 24 hr tablet    venlafaxine XR (EFFEXOR-XR) 75 MG 24 hr capsule    2. Primary insomnia  F51.01       Past Psychiatric History: Reviewed past psychiatric history from progress note on 12/13/2019.  Past trials of Ambien, Abilify, Effexor, Adderall, Wellbutrin, Viibryd  Past Medical History:  Past Medical History:  Diagnosis Date   Asthma    Cancer ([place])    skin   GERD (gastroesophageal reflux disease)    Hearing loss    HTN (hypertension)     OSA on CPAP    Thyroid disease     Past Surgical History:  Procedure Laterality Date   [place], 2007   REPLACEMENT TOTAL KNEE Bilateral 2017   TUBAL LIGATION  1994    Family Psychiatric History: Reviewed family psychiatric history from progress note on 12/13/2019.  Family History:  Family History  Problem Relation Age of Onset   Cancer Mother        breast   Cancer Father        lung   Pneumonia Father    Cancer Maternal Grandmother        colon   Alcohol abuse Brother    Diabetes Brother    Heart attack Brother    COPD Brother    Other Brother        sleep disorder    Social History: Reviewed social history from progress note on 12/13/2019. Social History   Socioeconomic History   Marital status: Married    Spouse name: Lanny Hurst   Number of children: 2   Years of education: Not on file   Highest education level: Bachelor's degree (e.g., BA, AB, BS)  Occupational History    Comment: retired  Tobacco Use   Smoking status: Never   Smokeless tobacco: Never  Substance and Sexual Activity   Alcohol use: No   Drug use: No   Sexual activity: Not on file  Other Topics Concern  Not on file  Social History Narrative   Lives with husband   Caffeine- diet coke 2 daily   Social Determinants of Health   Financial Resource Strain: Not on file  Food Insecurity: Not on file  Transportation Needs: Not on file  Physical Activity: Not on file  Stress: Not on file  Social Connections: Not on file    Allergies:  Allergies  Allergen Reactions   Elemental Sulfur Hives   Erythromycin Other (See Comments)    GI upset GI upset    Levofloxacin Other (See Comments)    Joint pain Joint pain    Sulfites    Tetracycline Other (See Comments)    GI upset GI upset    Aspirin Swelling and Hives    Lips only.    Metabolic Disorder Labs: No results found for: "HGBA1C", "MPG" No results found for: "PROLACTIN" No results found  for: "CHOL", "TRIG", "HDL", "CHOLHDL", "VLDL", "LDLCALC" No results found for: "TSH"  Therapeutic Level Labs: No results found for: "LITHIUM" No results found for: "VALPROATE" No results found for: "CBMZ"  Current Medications: Current Outpatient Medications  Medication Sig Dispense Refill   albuterol (VENTOLIN HFA) 108 (90 Base) MCG/ACT inhaler Inhale into the lungs.     amoxicillin-clavulanate (AUGMENTIN) 875-125 MG tablet Take 1 tablet by mouth 2 (two) times daily.     AMYLASE-LIPASE-PROTEASE PO Take by mouth.     benzonatate (TESSALON) 100 MG capsule Take 100 mg by mouth 3 (three) times daily as needed.     Cholecalciferol 25 MCG (1000 UT) capsule Take by mouth.     cycloSPORINE (RESTASIS) 0.05 % ophthalmic emulsion Restasis 0.05 % eye drops in a dropperette  INT 1 GTT IN OU BID UTD     estradiol (ESTRACE) 0.1 MG/GM vaginal cream      estradiol (VIVELLE-DOT) 0.05 MG/24HR patch      famotidine (PEPCID) 20 MG tablet Take 20 mg by mouth at bedtime.     Fexofenadine HCl (ALLEGRA PO) Take by mouth as needed.     fluticasone (FLONASE) 50 MCG/ACT nasal spray 2 sprays daily.      Fluticasone-Salmeterol (ADVAIR HFA IN) Inhale into the lungs 2 (two) times daily.     hydrochlorothiazide (HYDRODIURIL) 25 MG tablet Take 1 tablet by mouth daily.     L-Lysine 1000 MG TABS Take 1 tablet by mouth daily.     MAXITROL 3.5-10000-0.1 OINT      meloxicam (MOBIC) 15 MG tablet Take by mouth.     mometasone (ELOCON) 0.1 % lotion SMARTSIG:2-4 Drop(s) In Ear(s) Every Night PRN     montelukast (SINGULAIR) 10 MG tablet Take 1 tablet by mouth daily.     neomycin-polymyxin-hydrocortisone (CORTISPORIN) 3.5-10000-1 OTIC suspension 3 drops 3 (three) times daily.     Omega-3 Fatty Acids (FISH OIL) 1200 MG CAPS Take by mouth daily in the afternoon.     omeprazole (PRILOSEC) 40 MG capsule Take 40 mg by mouth daily.     Probiotic Product (PROBIOTIC PO) Take by mouth.     progesterone (PROMETRIUM) 100 MG capsule Take  by mouth.     zaleplon (SONATA) 10 MG capsule TAKE 1 CAPSULE BY MOUTH AT BEDTIME AS NEEDED FOR SLEEP. 30 capsule 4   buPROPion (WELLBUTRIN XL) 300 MG 24 hr tablet Take 1 tablet (300 mg total) by mouth in the morning. TAKE 1 TABLET BY MOUTH EVERY 24 HOURS 90 tablet 1   methylPREDNISolone (MEDROL DOSEPAK) 4 MG TBPK tablet Follow schedule on package instructions (Patient not  taking: Reported on 01/19/2022)     predniSONE (STERAPRED UNI-PAK 21 TAB) 10 MG (21) TBPK tablet Take by mouth as directed. (Patient not taking: Reported on 01/19/2022)     venlafaxine XR (EFFEXOR-XR) 75 MG 24 hr capsule Take 1 capsule (75 mg total) by mouth 2 (two) times daily with a meal. 180 capsule 1   No current facility-administered medications for this visit.     Musculoskeletal: Strength & Muscle Tone: within normal limits Gait & Station: normal Patient leans: N/A  Psychiatric Specialty Exam: Review of Systems  Psychiatric/Behavioral: Negative.    All other systems reviewed and are negative.   Blood pressure 136/84, pulse 82, temperature 97.6 F (36.4 C), temperature source Oral, height '5\' 5"'$  (1.651 m), weight 224 lb 3.2 oz (101.7 kg).Body mass index is 37.31 kg/m.  General Appearance: Casual  Eye Contact:  Good  Speech:  Clear and Coherent  Volume:  Normal  Mood:  Euthymic  Affect:  Congruent  Thought Process:  Goal Directed and Descriptions of Associations: Intact  Orientation:  Full (Time, Place, and Person)  Thought Content: Logical   Suicidal Thoughts:  No  Homicidal Thoughts:  No  Memory:  Immediate;   Fair Recent;   Fair Remote;   Fair  Judgement:  Fair  Insight:  Good  Psychomotor Activity:  Normal  Concentration:  Concentration: Fair and Attention Span: Fair  Recall:  Poor  Fund of Knowledge: Fair  Language: Fair  Akathisia:  No  Handed:  Right  AIMS (if indicated): not done  Assets:  Communication Skills Desire for Improvement Housing Social Support  ADL's:  Intact  Cognition:  WNL  Sleep:  Fair   Screenings: [place] Office Visit from 09/29/2021 in [facility] Office Visit from 10/01/2020 in [facility] Total Score 0 0      [place] Visit from 01/19/2022 in [facility] Office Visit from 09/29/2021 in [facility] Office Visit from 10/01/2020 in [facility]  Total GAD-7 Score 2 1 [place] Visit from 05/22/2020 in [facility]  Total Score (max 30 points ) 29      PHQ2-9    [place] Visit from 01/19/2022 in [facility] Visit from 09/29/2021 in [facility] from 06/25/2021 in [facility] from 03/13/2021 in [facility] Visit from 02/13/2021 in [facility]  PHQ-2 Total Score 0 1 0 0 0  PHQ-9 Total Score 2 -- -- -- --      [place] Visit from 01/19/2022 in [facility] Counselor from 07/16/2021 in [facility] Office Visit from 06/25/2021 in [facility] No Risk No Risk No Risk        Assessment and Plan: Lisa Chambers is a 71 year old Caucasian female, retired, married, has a history of depression, multiple medical problems including obstructive sleep apnea with CPAP was evaluated in office today.  Patient is currently stable.  Plan as noted below.  Plan MDD in remission Venlafaxine extended release 75 mg p.o. twice daily Wellbutrin XL 300 mg p.o. daily We will consider tapering off one of these medications in the future, patient declines at this time.  Primary insomnia-stable Sonata 10 mg p.o. nightly  Reviewed [place] PMP  [id number].  Follow-up in clinic in 6 months or sooner if needed.    This note was generated in part or whole with voice recognition software. Voice recognition is usually quite accurate but there are transcription errors that can and very often do occur. I apologize for any typographical errors that were not detected and corrected.     Ursula Alert, MD 01/19/2022, 11:54 AM

## 2022-02-10 ENCOUNTER — Other Ambulatory Visit: Payer: Self-pay | Admitting: Internal Medicine

## 2022-02-10 DIAGNOSIS — Z1231 Encounter for screening mammogram for malignant neoplasm of breast: Secondary | ICD-10-CM

## 2022-02-24 ENCOUNTER — Ambulatory Visit
Admission: RE | Admit: 2022-02-24 | Discharge: 2022-02-24 | Disposition: A | Discharge status: Home / Self Care | Payer: Medicare PPO | Source: Ambulatory Visit | Attending: Internal Medicine | Admitting: Internal Medicine

## 2022-02-24 ENCOUNTER — Ambulatory Visit: Payer: Medicare PPO

## 2022-02-24 DIAGNOSIS — Z1231 Encounter for screening mammogram for malignant neoplasm of breast: Secondary | ICD-10-CM

## 2022-03-03 ENCOUNTER — Ambulatory Visit (HOSPITAL_COMMUNITY): Payer: Medicare PPO | Admitting: Licensed Clinical Social Worker

## 2022-03-04 ENCOUNTER — Ambulatory Visit (HOSPITAL_COMMUNITY): Payer: Medicare PPO | Admitting: Licensed Clinical Social Worker

## 2022-04-01 ENCOUNTER — Ambulatory Visit (HOSPITAL_COMMUNITY): Payer: Medicare PPO | Admitting: Licensed Clinical Social Worker

## 2022-04-01 DIAGNOSIS — Z636 Dependent relative needing care at home: Secondary | ICD-10-CM | POA: Diagnosis not present

## 2022-04-01 DIAGNOSIS — F4320 Adjustment disorder, unspecified: Secondary | ICD-10-CM

## 2022-04-01 NOTE — Progress Notes (Signed)
  THERAPIST PROGRESS NOTE  Session Time: [place] in office visit for patient and LCSW clinician  Pt provided verbal consent for student, Lucia Gaskins, to be present throughout session.   Participation Level: Active  Behavioral Response: NeatAlertAnxious  Type of Therapy: Individual Therapy  Treatment Goals addressed:     ProgressTowards Goals: Progressing  Interventions: CBT  Summary: Lisa Chambers is a 72 y.o. female who presents with improving symptoms related to adjustment disorder diagnosis.  Pt reports fewer anxiety and depression symptoms.  Pt reports good quality and quantity of sleep.   Allowed pt to explore and express thoughts and feelings associated with recent life situations and external stressors  Continued recommendations are as follows: self care behaviors, positive social engagements, focusing on overall work/home/life balance, and focusing on positive physical and emotional wellness.   Suicidal/Homicidal: No  Therapist Response: Pt is continuing to apply interventions learned in session into daily life situations. Pt is currently on track to meet goals utilizing interventions mentioned above. Personal growth and progress noted. Treatment to continue as indicated.   Plan: Return again in 4 weeks.  Diagnosis:  No diagnosis found.  Collaboration of Care: Other pt encouraged to continue care with psychiatrist of record, Dr Shea Evans  Patient/Guardian was advised Release of Information must be obtained prior to any record release in order to collaborate their care with an outside provider. Patient/Guardian was advised if they have not already done so to contact the registration department to sign all necessary forms in order for [place] to release information regarding their care.   Consent: Patient/Guardian gives verbal consent for treatment and assignment of benefits for services provided during this visit.  Patient/Guardian expressed understanding and agreed to proceed.   [place], LCSW 04/01/2022

## 2022-04-30 ENCOUNTER — Ambulatory Visit (HOSPITAL_COMMUNITY): Payer: Medicare PPO | Admitting: Licensed Clinical Social Worker

## 2022-04-30 DIAGNOSIS — F4320 Adjustment disorder, unspecified: Secondary | ICD-10-CM

## 2022-04-30 DIAGNOSIS — Z636 Dependent relative needing care at home: Secondary | ICD-10-CM
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
## 2022-05-18 ENCOUNTER — Other Ambulatory Visit: Payer: Self-pay | Admitting: Psychiatry

## 2022-05-18 DIAGNOSIS — F5101 Primary insomnia: Secondary | ICD-10-CM

## 2022-05-18 DIAGNOSIS — F3342 Major depressive disorder, recurrent, in full remission: Secondary | ICD-10-CM

## 2022-06-10 ENCOUNTER — Ambulatory Visit: Payer: Medicare PPO | Admitting: Nurse Practitioner

## 2022-06-17 ENCOUNTER — Ambulatory Visit (HOSPITAL_COMMUNITY): Payer: Medicare PPO | Admitting: Licensed Clinical Social Worker

## 2022-06-25 ENCOUNTER — Encounter: Payer: Self-pay | Admitting: Nurse Practitioner

## 2022-06-25 ENCOUNTER — Other Ambulatory Visit
Admission: RE | Admit: 2022-06-25 | Discharge: 2022-06-25 | Disposition: A | Discharge status: Home / Self Care | Payer: Medicare PPO | Source: Ambulatory Visit | Attending: Nurse Practitioner | Admitting: Nurse Practitioner

## 2022-06-25 ENCOUNTER — Ambulatory Visit: Payer: Medicare PPO | Attending: Nurse Practitioner | Admitting: Nurse Practitioner

## 2022-06-25 VITALS — BP 132/86 | HR 85 | Ht 65.0 in | Wt 221.2 lb

## 2022-06-25 DIAGNOSIS — E782 Mixed hyperlipidemia: Secondary | ICD-10-CM

## 2022-06-25 DIAGNOSIS — Z79899 Other long term (current) drug therapy: Secondary | ICD-10-CM

## 2022-06-25 DIAGNOSIS — R5383 Other fatigue: Secondary | ICD-10-CM

## 2022-06-25 DIAGNOSIS — R0609 Other forms of dyspnea: Secondary | ICD-10-CM | POA: Diagnosis not present

## 2022-06-25 DIAGNOSIS — I10 Essential (primary) hypertension: Secondary | ICD-10-CM

## 2022-06-25 LAB — BASIC METABOLIC PANEL
Anion gap: 9 (ref 5–15)
BUN: 15 mg/dL (ref 8–23)
CO2: 27 mmol/L (ref 22–32)
Calcium: 9.1 mg/dL (ref 8.9–10.3)
Chloride: 97 mmol/L — ABNORMAL LOW (ref 98–111)
Creatinine, Ser: 0.73 mg/dL (ref 0.44–1.00)
GFR, Estimated: >60 mL/min (ref >60)
Glucose, Bld: 84 mg/dL (ref 70–99)
Potassium: 3.3 mmol/L — ABNORMAL LOW (ref 3.5–5.1)
Sodium: 133 mmol/L — ABNORMAL LOW (ref 135–145)

## 2022-06-25 MED ORDER — METOPROLOL TARTRATE 100 MG PO TABS: 100.0000 mg | ORAL_TABLET | Freq: Once | ORAL | 0 refills | Status: DC

## 2022-06-25 NOTE — Progress Notes (Signed)
Office Visit    Patient Name: Lisa Chambers Date of Encounter: 06/25/2022  Primary Care Provider:  Lauro Regulus, MD Primary Cardiologist:  Lorine Bears, MD  Chief Complaint    72 y/o ? w/ a h/o chest pain and low risk stress test in 2021 HTN, HL, obesity, and FH of CAD/PAD, who presents for f/u related to dyspnea on exertion and fatigue.  Past Medical History    Past Medical History:  Diagnosis Date   Allergic rhinitis    Anemia    Asthma    Cancer (HCC)    skin   Chest pain    a. 01/2020 MV: EF 70%, no isch/infarct. CT attenuation correction images w/ minimal Ao athersclerosis, no significant cor Ca2+.   Depression    GERD (gastroesophageal reflux disease)    Hearing loss    HTN (hypertension)    Hx of migraine headaches    Multinodular goiter    OSA on CPAP    Osteoarthritis    Thyroid disease    Past Surgical History:  Procedure Laterality Date   CHOLECYSTECTOMY     NASAL SINUS SURGERY  1996, 2007   REPLACEMENT TOTAL KNEE Bilateral 2017   TUBAL LIGATION  1994    Allergies  Allergies  Allergen Reactions   Aspirin Hives and Swelling    Lips only.  Lips only.  Lips only.   Elemental Sulfur Hives   Erythromycin Other (See Comments)    GI upset  Other reaction(s): Other (See Comments)  GI upset  GI upset  GI upset  GI upset   Levofloxacin Other (See Comments)    Joint pain  Other reaction(s): Other (See Comments)  Joint pain  Joint pain  Joint pain  Joint pain   Sulfites    Tetracycline Other (See Comments)    GI upset  Other reaction(s): Other (See Comments)  GI upset  GI upset  GI upset  GI upset    History of Present Illness    72 y/o ? w/ a h/o chest pain and low risk stress test in 2021 HTN, HL, obesity, and FH of CAD/PAD.  She was previously evaluated in December 2021 in the setting of intermittent substernal chest pain, predominantly occurring at rest but sometimes with exertion.  Stress testing was undertaken and  showed no significant ischemia with an EF of 70%.  Minimal aortic atherosclerosis without significant coronary calcification was seen on CT attenuation corrected images.  Overall, the study was felt to be low risk.  Over the past 3 years, patient has generally done well.  Over the past 6 weeks however, she has been experiencing profound fatigue and some degree of dyspnea on exertion.  She was initially diagnosed with sinusitis and treated with antibiotics and steroids with some improvement in symptoms, but has continued to feel markedly fatigued throughout the day.  She is fairly active and walks regularly.  She has chronic dyspnea on exertion but has not been having chest pain.  She does not think her dyspnea on exertion has progressed any.  She denies palpitations, PND, orthopnea, dizziness, syncope, edema, or early satiety.  Home Medications    Current Outpatient Medications  Medication Sig Dispense Refill   albuterol (VENTOLIN HFA) 108 (90 Base) MCG/ACT inhaler Inhale into the lungs.     AMYLASE-LIPASE-PROTEASE PO Take by mouth.     buPROPion (WELLBUTRIN XL) 300 MG 24 hr tablet Take 1 tablet (300 mg total) by mouth in the morning. TAKE 1 TABLET  BY MOUTH EVERY 24 HOURS 90 tablet 1   estradiol (ESTRACE) 0.1 MG/GM vaginal cream      estradiol (VIVELLE-DOT) 0.05 MG/24HR patch      famotidine (PEPCID) 20 MG tablet Take 20 mg by mouth at bedtime.     Fexofenadine HCl (ALLEGRA PO) Take by mouth as needed.     fluticasone (FLONASE) 50 MCG/ACT nasal spray 2 sprays daily.      Fluticasone-Salmeterol (ADVAIR HFA IN) Inhale into the lungs 2 (two) times daily.     hydrochlorothiazide (HYDRODIURIL) 25 MG tablet Take 1 tablet by mouth daily.     L-Lysine 1000 MG TABS Take 1 tablet by mouth daily.     montelukast (SINGULAIR) 10 MG tablet Take 1 tablet by mouth daily.     Omega-3 Fatty Acids (FISH OIL) 1200 MG CAPS Take by mouth daily in the afternoon.     omeprazole (PRILOSEC) 40 MG capsule Take 40 mg by  mouth daily.     Probiotic Product (PROBIOTIC PO) Take by mouth.     progesterone (PROMETRIUM) 100 MG capsule Take by mouth.     venlafaxine XR (EFFEXOR-XR) 75 MG 24 hr capsule Take 1 capsule (75 mg total) by mouth 2 (two) times daily with a meal. 180 capsule 1   zaleplon (SONATA) 10 MG capsule TAKE 1 CAPSULE BY MOUTH AT BEDTIME AS NEEDED FOR SLEEP. 30 capsule 4   amoxicillin-clavulanate (AUGMENTIN) 875-125 MG tablet Take 1 tablet by mouth 2 (two) times daily.     benzonatate (TESSALON) 100 MG capsule Take 100 mg by mouth 3 (three) times daily as needed.     Cholecalciferol 25 MCG (1000 UT) capsule Take by mouth. (Patient not taking: Reported on 06/25/2022)     cycloSPORINE (RESTASIS) 0.05 % ophthalmic emulsion Restasis 0.05 % eye drops in a dropperette  INT 1 GTT IN OU BID UTD (Patient not taking: Reported on 06/25/2022)     MAXITROL 3.5-10000-0.1 OINT      meloxicam (MOBIC) 15 MG tablet Take by mouth.     methylPREDNISolone (MEDROL DOSEPAK) 4 MG TBPK tablet Follow schedule on package instructions (Patient not taking: Reported on 01/19/2022)     mometasone (ELOCON) 0.1 % lotion SMARTSIG:2-4 Drop(s) In Ear(s) Every Night PRN     neomycin-polymyxin-hydrocortisone (CORTISPORIN) 3.5-10000-1 OTIC suspension 3 drops 3 (three) times daily.     predniSONE (STERAPRED UNI-PAK 21 TAB) 10 MG (21) TBPK tablet Take by mouth as directed. (Patient not taking: Reported on 01/19/2022)     No current facility-administered medications for this visit.     Review of Systems    Dyspnea on exertion and fatigue as outlined above.  She denies chest pain, palpitations, PND, orthopnea, dizziness, syncope, edema, or early satiety.  All other systems reviewed and are otherwise negative except as noted above.    Physical Exam    VS:  BP 132/86 (BP Location: Left Arm, Patient Position: Sitting, Cuff Size: Normal)   Pulse 85   Ht 5\' 5"  (1.651 m)   Wt 221 lb 3.2 oz (100.3 kg)   SpO2 97%   BMI 36.81 kg/m  , BMI Body  mass index is 36.81 kg/m.     GEN: Well nourished, well developed, in no acute distress. HEENT: normal. Neck: Supple, no JVD, carotid bruits, or masses. Cardiac: RRR, no murmurs, rubs, or gallops. No clubbing, cyanosis, edema.  Radials 2+/PT 2+ and equal bilaterally.  Respiratory:  Respirations regular and unlabored, clear to auscultation bilaterally. GI: Soft, nontender, nondistended, BS + x  4. MS: no deformity or atrophy. Skin: warm and dry, no rash. Neuro:  Strength and sensation are intact. Psych: Normal affect.  Accessory Clinical Findings    ECG personally reviewed by me today -regular sinus rhythm, 85, septal infarct- no acute changes.  Labs dated May 22, 2022 from Care Everywhere:  Hemoglobin 14.8, hematocrit 45.2, WBC 9.1, platelets 378 Sodium 138, potassium 4.1, chloride 99, CO2 25.5, BUN 16, creatinine 0.8, glucose 92 Calcium 9.4, albumin 4.3, total protein 7.1 Total bilirubin 0.3, alkaline phosphatase 77, AST 19, ALT 13 TSH 3.305  Labs dated February 25, 2022 from Care Everywhere:  Total cholesterol 183, triglycerides 176, HDL 44.9, LDL 103  Assessment & Plan    1.  Dyspnea on exertion/fatigue: Over the past 6 weeks, patient has had progressive fatigue and also notes dyspnea on exertion.  She does not experience chest pain.  She does have a family history of premature CAD and PAD with other risk factors including hypertension, hyperlipidemia, and obesity.  We agreed to pursue a coronary CT angiogram to rule out obstructive coronary artery disease.  Follow-up basic metabolic panel today.  She will be provided a prescription for metoprolol tartrate 100 mg x 1 to be taken 2 hours prior to the test.  2.  Essential hypertension: Stable on HCTZ therapy.  3.  Hyperlipidemia: LDL of 103 in January.  With history of aortic atherosclerosis, and 10-year CVD risk of 10.3%, low to moderate dose statin therapy is appropriate.  Patient would like to work on lifestyle modifications  as an initial effort, which is perfectly reasonable.  Further recommendations pending coronary CT angiogram.  4.  Morbid obesity: Patient is hoping to exercise more pending her coronary CTA.  She does walk some with chronic dyspnea on exertion, which she hopes is secondary to asthma and deconditioning.  5.  Disposition: Follow-up basic metabolic panel today.  Follow-up coronary CT angiogram.  Follow-up in clinic in 1 month or sooner if necessary.   Nicolasa Ducking, NP 06/25/2022, 2:08 PM

## 2022-06-25 NOTE — Patient Instructions (Signed)
Medication Instructions:  Take Metoprolol Tartrate 100 mg once 2 hours prior to CT.  *If you need a refill on your cardiac medications before your next appointment, please call your pharmacy*   Lab Work: Your provider would like for you to have following labs drawn: BMET.   Please go to the [facility] entrance and check in at the front desk.  You do not need an appointment.  They are open from 7am-6 pm.   If you have labs (blood work) drawn today and your tests are completely normal, you will receive your results only by: MyChart Message (if you have MyChart) OR A paper copy in the mail If you have any lab test that is abnormal or we need to change your treatment, we will call you to review the results.   Testing/Procedures:   Your cardiac CT will be scheduled at one of the below locations:   [facility] [address] [place], [place] [address] [phone]  OR   [facility] [address] [place], [place] [address] [phone]  If scheduled at [facility] or [facility], please arrive 15 mins early for check-in and test prep.   Please follow these instructions carefully (unless otherwise directed):   We will administer nitroglycerin during this exam.   On the Night Before the Test: Be sure to Drink plenty of water. Do not consume any caffeinated/decaffeinated beverages or chocolate 12 hours prior to your test. Do not take any antihistamines 12 hours prior to your test.  On the Day of the Test: Drink plenty of water until 1 hour prior to the test. Do not eat any food 1 hour prior to test. You may take your regular medications prior to the test.  Take metoprolol (Lopressor) two hours prior to test. If you take Hydrochlorothiazide please HOLD on the morning of the test. FEMALES- please wear underwire-free bra if available, avoid dresses &  tight clothing       After the Test: Drink plenty of water. After receiving IV contrast, you may experience a mild flushed feeling. This is normal. On occasion, you may experience a mild rash up to 24 hours after the test. This is not dangerous. If this occurs, you can take Benadryl 25 mg and increase your fluid intake. If you experience trouble breathing, this can be serious. If it is severe call 911 IMMEDIATELY. If it is mild, please call our office. If you take any of these medications: Glipizide/Metformin, Avandament, Glucavance, please do not take 48 hours after completing test unless otherwise instructed.  We will call to schedule your test 2-4 weeks out understanding that some insurance companies will need an authorization prior to the service being performed.   For non-scheduling related questions, please contact the cardiac imaging nurse navigator should you have any questions/concerns: Rock[facility] [place], Cardiac Imaging Nurse Navigator Larey Brick, Cardiac Imaging Nurse Navigator [facility] Direct Office Dial: [phone]   For scheduling needs, including cancellations and rescheduling, please call [place], [phone].      Follow-Up: At [facility] you and your health needs are our priority.  As part of our continuing mission to provide you with exceptional heart care, we have created designated Provider Care Teams.  These Care Teams include your primary Cardiologist (physician) and Advanced Practice Providers (APPs -  Physician Assistants and Nurse Practitioners) who all work together to provide you with the care you  need, when you need it.  We recommend signing up for the patient portal called "MyChart".  Sign up information is provided on this After Visit Summary.  MyChart is used to connect with patients for Virtual Visits (Telemedicine).  Patients are able to view lab/test results, encounter notes, upcoming appointments, etc.   Non-urgent messages can be sent to your provider as well.   To learn more about what you can do with MyChart, go to [email or web address].    Your next appointment:   1 month(s)  Provider:   Nicolasa Ducking, NP

## 2022-06-26 ENCOUNTER — Other Ambulatory Visit: Payer: Self-pay | Admitting: *Deleted

## 2022-06-26 DIAGNOSIS — E876 Hypokalemia: Secondary | ICD-10-CM

## 2022-06-29 ENCOUNTER — Ambulatory Visit (HOSPITAL_COMMUNITY): Payer: Medicare PPO | Admitting: Licensed Clinical Social Worker

## 2022-06-30 IMAGING — CT CT ANGIO CHEST
3 of 7 series · 19 of 46 positions shown · IV contrast (APPLIED)
Comparison: 06/08/2005

CLINICAL DATA: Chest pain.

EXAM:
CT ANGIOGRAPHY CHEST WITH CONTRAST
TECHNIQUE: Multidetector CT imaging of the chest was performed using the
standard protocol during bolus administration of intravenous
contrast. Multiplanar CT image reconstructions and MIPs were
obtained to evaluate the vascular anatomy.
CONTRAST:  100mL OMNIPAQUE IOHEXOL 350 MG/ML SOLN

[Series 5: axial arterial · axial · arterial · 0.66mm/px · z∈[-360,-105]mm · 9 of 107 slices shown]
[im 11/107  lung]
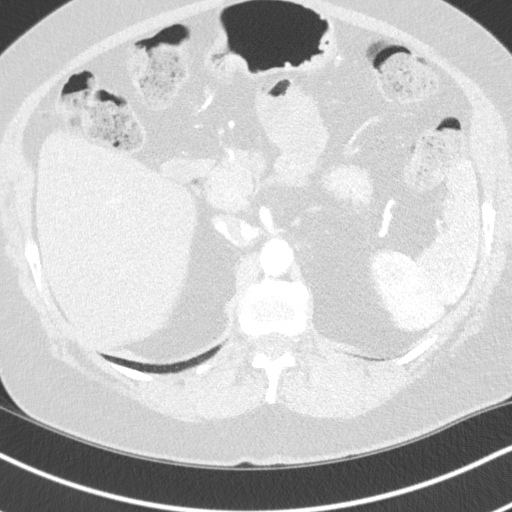
[im 22/107  soft-tissue]
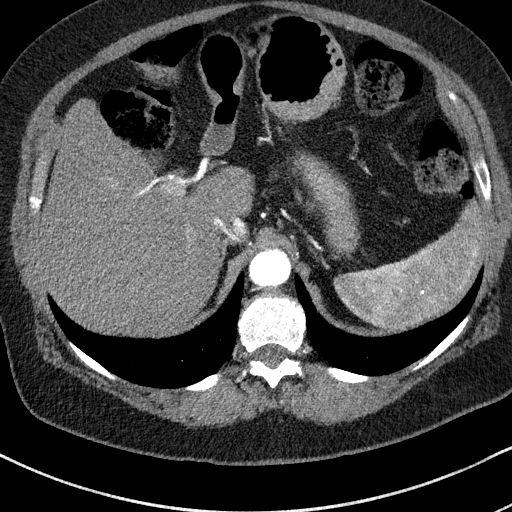
[im 32/107  lung]
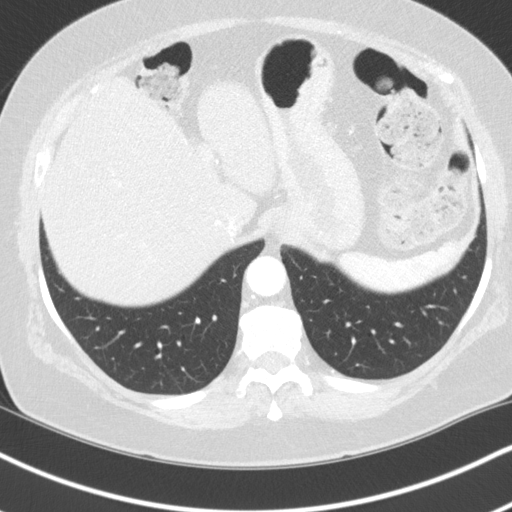
[im 43/107  soft-tissue]
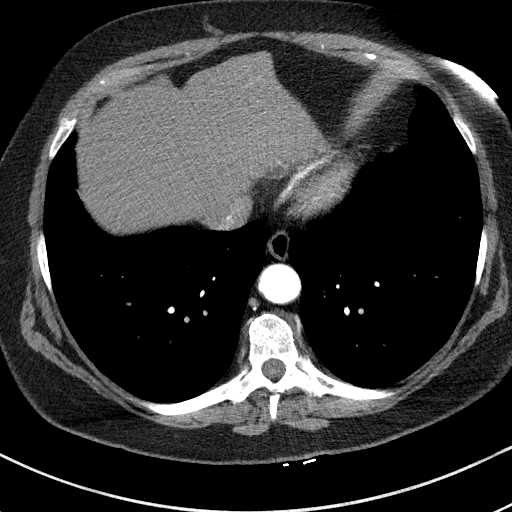
[im 54/107  lung]
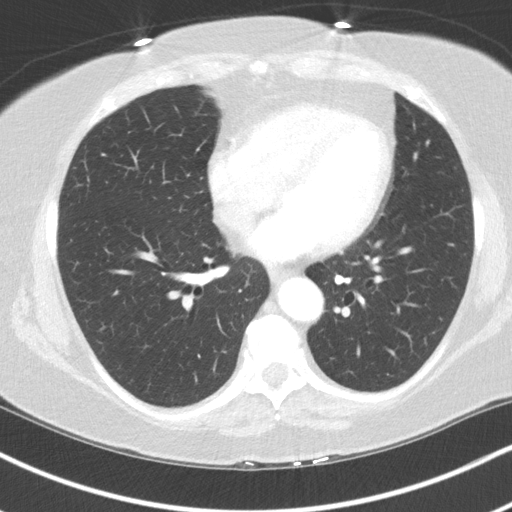
[im 64/107  soft-tissue]
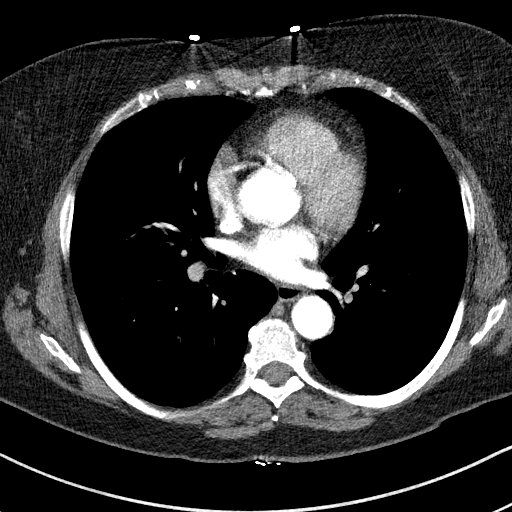
[im 75/107  lung]
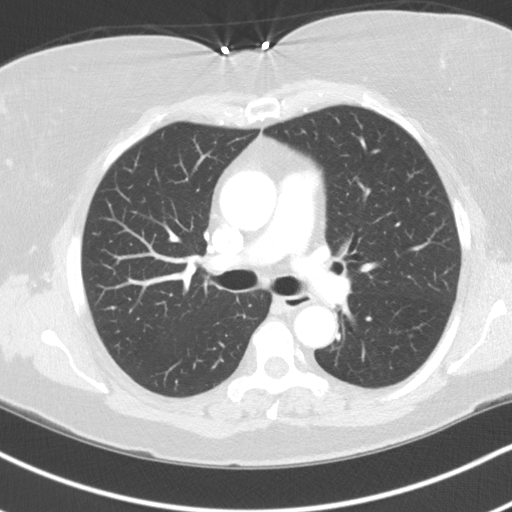
[im 85/107  soft-tissue]
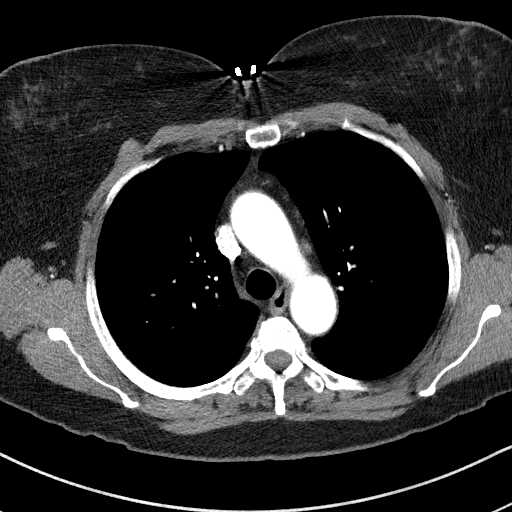
[im 96/107  lung]
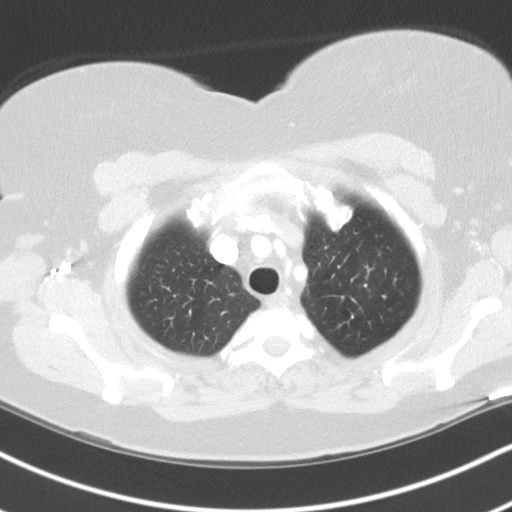

[Series 6: lung · axial · 0.66mm/px · z∈[-370,-136]mm · 7 of 160 slices shown]
[im 11/160  soft-tissue]
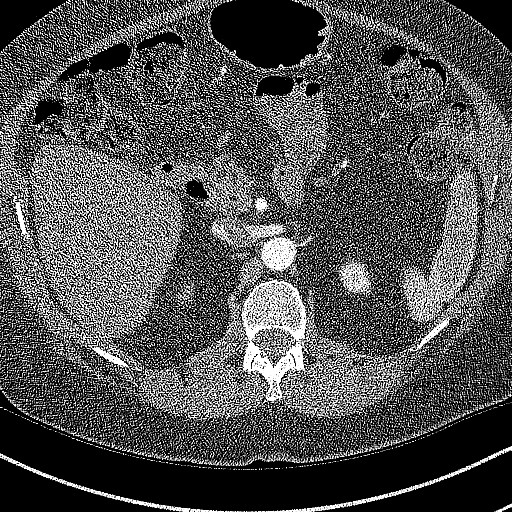
[im 32/160  soft-tissue]
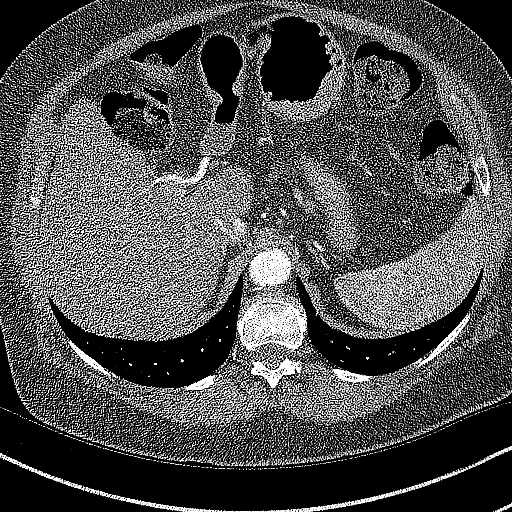
[im 54/160  soft-tissue]
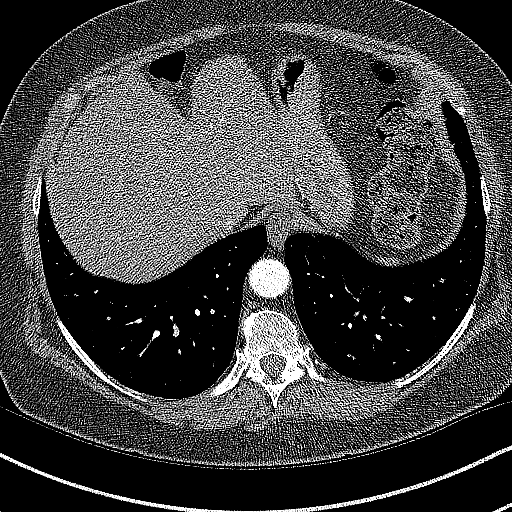
[im 75/160  soft-tissue]
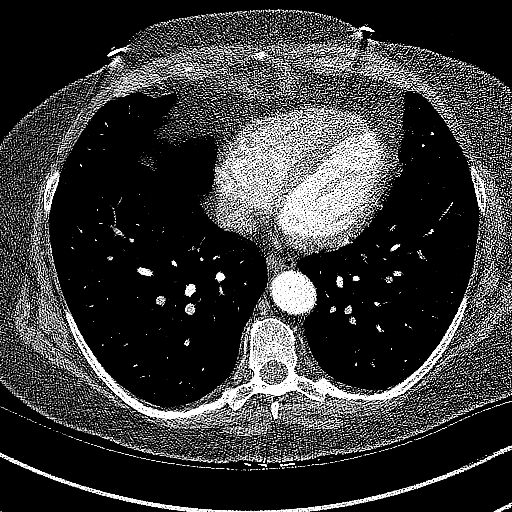
[im 85/160  soft-tissue]
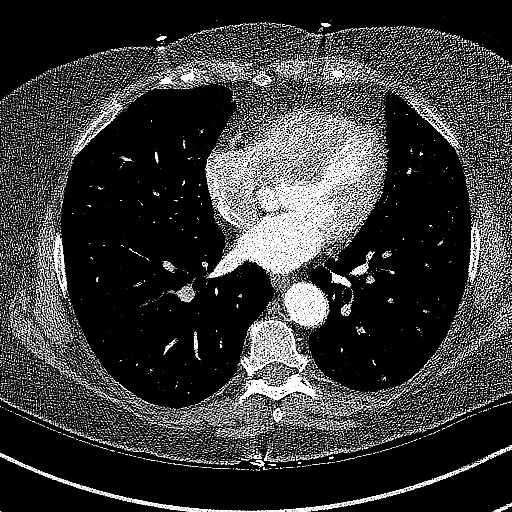
[im 107/160  soft-tissue]
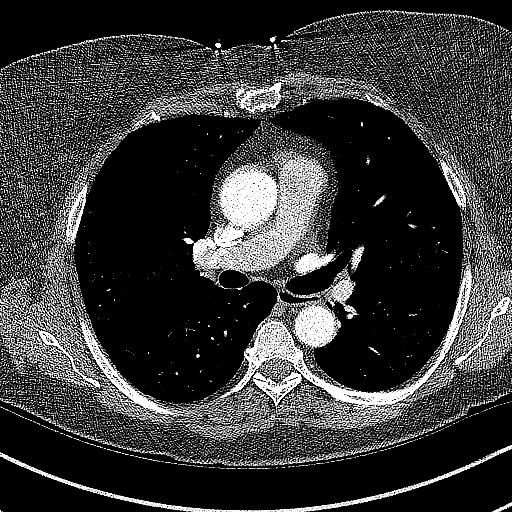
[im 128/160  soft-tissue]
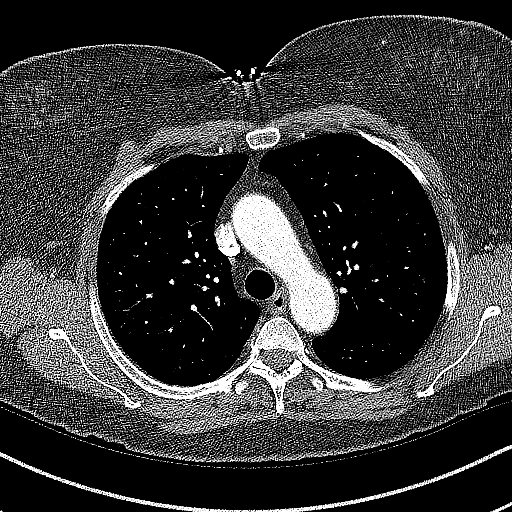

[Series 8: coronals · coronal · 0.72mm/px · 3 of 160 slices shown]
[im 40/160  soft-tissue]
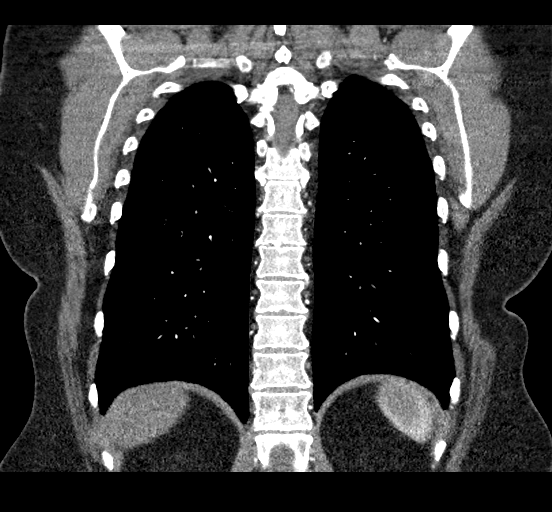
[im 80/160  soft-tissue]
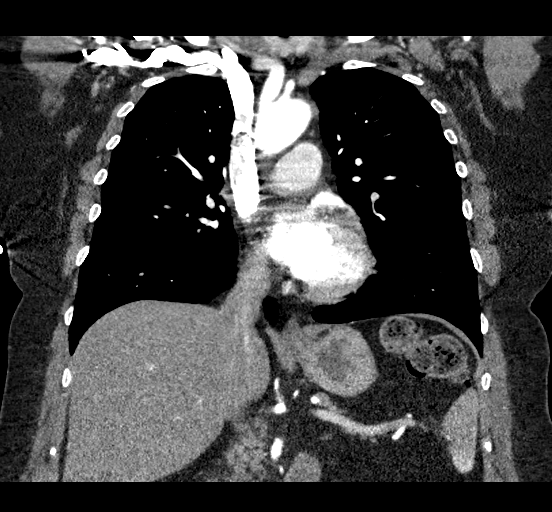
[im 120/160  soft-tissue]
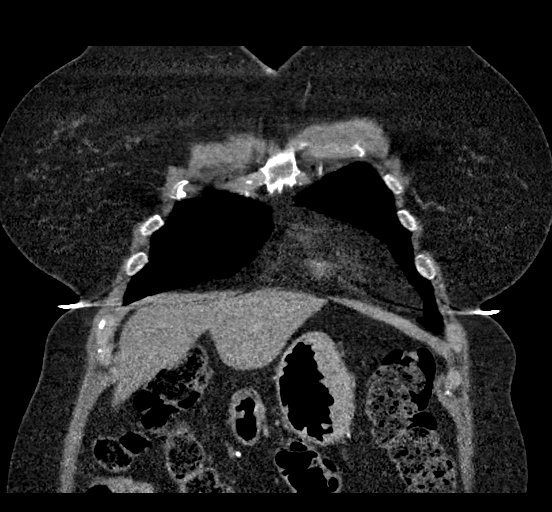

[19 of 46 positions shown; findings below may reference images not displayed]

FINDINGS: Cardiovascular: The heart is normal in size. No pericardial
effusion. There is mild tortuosity of the thoracic aorta but no
aneurysm or dissection. The branch vessels are patent. No definite
coronary artery calcifications. The pulmonary arteries are normal in
caliber.

Mediastinum/Nodes: No mediastinal or hilar mass or adenopathy. The
esophagus is unremarkable. There is a 2 cm right thyroid lobe
nodule. Recommend thyroid ultrasound examination (ref: [HOSPITAL]. [DATE]): 143-50).

Lungs/Pleura: No acute pulmonary findings. No worrisome pulmonary
lesions or pulmonary nodules. No interstitial lung disease or
bronchiectasis.

Upper Abdomen: No significant upper abdominal findings. Normal upper
abdominal aorta and branch vessels. The gallbladder is surgically
absent. No biliary dilatation. Spleen

Musculoskeletal: No breast masses, supraclavicular or axillary
adenopathy. No significant bony findings.

Review of the MIP images confirms the above findings.
IMPRESSION: 1. Mild tortuosity of the thoracic aorta but no aneurysm or
dissection.
2. No acute pulmonary findings.
3. 2 cm right thyroid lobe nodule. Recommend thyroid ultrasound
examination (ref: [HOSPITAL].

## 2022-07-02 ENCOUNTER — Other Ambulatory Visit
Admission: RE | Admit: 2022-07-02 | Discharge: 2022-07-02 | Disposition: A | Discharge status: Home / Self Care | Payer: Medicare PPO | Attending: Nurse Practitioner | Admitting: Nurse Practitioner

## 2022-07-02 ENCOUNTER — Telehealth: Payer: Self-pay

## 2022-07-02 DIAGNOSIS — E876 Hypokalemia: Secondary | ICD-10-CM

## 2022-07-02 LAB — BASIC METABOLIC PANEL
Anion gap: 7 (ref 5–15)
BUN: 15 mg/dL (ref 8–23)
CO2: 30 mmol/L (ref 22–32)
Calcium: 8.8 mg/dL — ABNORMAL LOW (ref 8.9–10.3)
Chloride: 98 mmol/L (ref 98–111)
Creatinine, Ser: 0.73 mg/dL (ref 0.44–1.00)
GFR, Estimated: >60 mL/min (ref >60)
Glucose, Bld: 115 mg/dL — ABNORMAL HIGH (ref 70–99)
Potassium: 3.5 mmol/L (ref 3.5–5.1)
Sodium: 135 mmol/L (ref 135–145)

## 2022-07-02 MED ORDER — POTASSIUM CHLORIDE CRYS ER 20 MEQ PO TBCR: 20.0000 meq | EXTENDED_RELEASE_TABLET | Freq: Every day | ORAL | 1 refills | Status: DC

## 2022-07-02 NOTE — Telephone Encounter (Signed)
-----   Message from Creig Hines, NP sent at 07/02/2022  2:34 PM EDT ----- Kidney function is normal.  Potassium is low-normal at 3.5.  I suspect she is losing some potassium in the setting of HCTZ therapy.  Doesn't look like a diet rich in higher potassium foods has made much of a difference.  In that setting, I recommend that she start Kdur daily.  F/u BMET in 1-2 wks to ensure that potassium has improved.

## 2022-07-08 ENCOUNTER — Ambulatory Visit (HOSPITAL_COMMUNITY): Payer: Medicare PPO | Admitting: Licensed Clinical Social Worker

## 2022-07-08 DIAGNOSIS — F4320 Adjustment disorder, unspecified: Secondary | ICD-10-CM

## 2022-07-08 DIAGNOSIS — Z636 Dependent relative needing care at home: Secondary | ICD-10-CM

## 2022-07-08 NOTE — Progress Notes (Signed)
  THERAPIST PROGRESS NOTE  Session Time: 1015-11a  Behavioral [facility] in office visit for patient and LCSW clinician  Participation Level: Active  Behavioral Response: NeatAlertAnxious  Type of Therapy: Individual Therapy  Treatment Goals addressed: Reduce overall frequency, intensity, and duration of the anxiety so that daily functioning is not impaired  as evidenced by pt self report 3 out of 5 sessions documented.    ProgressTowards Goals: Progressing  Interventions: CBT  Summary: Lisa Chambers is a 73 y.o. female who presents with improving symptoms related to adjustment disorder diagnosis.  Patient reports that overall mood has been stable, and that she is managing situational stressors well.  Patient reports good quality and quantity of sleep.  Patient reports that appetite is within normal limits.  Assisted pt with identifying stress associated with caring for their husband with parkinson's diagnosis and with recent diagnosis of melanoma on leg that needs future surgery. Provided pt with positive support and encouragement as pt explored additional stressors. Reviewed importance of pt prioritizing self care.   Continued recommendations are as follows: self care behaviors, positive social engagements, focusing on overall work/home/life balance, and focusing on positive physical and emotional wellness.   Suicidal/Homicidal: No  Therapist Response: Pt is continuing to apply interventions learned in session into daily life situations. Pt is currently on track to meet goals utilizing interventions mentioned above. Personal growth and progress noted. Treatment to continue as indicated.   Patient is continuing to build and develop overall behaviors to improve self-care, and improving coping strategies and mechanisms.  Patient is being intentional about engaging in positive activities.  Patient is able to identify support system.  Plan: Return again in 4  weeks.  Diagnosis:  Encounter Diagnoses  Name Primary?   Adjustment disorder, unspecified type Yes   Caregiver stress    Collaboration of Care: Other pt encouraged to continue care with psychiatrist of record, Dr Elna Breslow  Patient/Guardian was advised Release of Information must be obtained prior to any record release in order to collaborate their care with an outside provider. Patient/Guardian was advised if they have not already done so to contact the registration department to sign all necessary forms in order for [place] to release information regarding their care.   Consent: Patient/Guardian gives verbal consent for treatment and assignment of benefits for services provided during this visit. Patient/Guardian expressed understanding and agreed to proceed.   Ernest Haber Travin Marik, LCSW 07/08/2022

## 2022-07-13 ENCOUNTER — Telehealth: Payer: Self-pay | Admitting: Cardiovascular Disease

## 2022-07-13 NOTE — Telephone Encounter (Signed)
Patient called to confirm orders for her lab work have been placed as she has been on potassium for the last 10 days.

## 2022-07-13 NOTE — Telephone Encounter (Signed)
Spoke to patient and informed her that the lab orders have been placed and that she can [facility] mall to have them drawn any time between 7:30 AM to 5:30 PM. Patient understood with read back.

## 2022-07-15 ENCOUNTER — Other Ambulatory Visit
Admission: RE | Admit: 2022-07-15 | Discharge: 2022-07-15 | Disposition: A | Discharge status: Home / Self Care | Payer: Medicare PPO | Source: Ambulatory Visit | Attending: Cardiovascular Disease | Admitting: Cardiovascular Disease

## 2022-07-15 DIAGNOSIS — E876 Hypokalemia: Secondary | ICD-10-CM | POA: Diagnosis present

## 2022-07-15 LAB — BASIC METABOLIC PANEL
Anion gap: 11 (ref 5–15)
BUN: 15 mg/dL (ref 8–23)
CO2: 26 mmol/L (ref 22–32)
Calcium: 8.8 mg/dL — ABNORMAL LOW (ref 8.9–10.3)
Chloride: 100 mmol/L (ref 98–111)
Creatinine, Ser: 0.82 mg/dL (ref 0.44–1.00)
GFR, Estimated: >60 mL/min (ref >60)
Glucose, Bld: 87 mg/dL (ref 70–99)
Potassium: 3.7 mmol/L (ref 3.5–5.1)
Sodium: 137 mmol/L (ref 135–145)

## 2022-07-20 ENCOUNTER — Encounter: Payer: Self-pay | Admitting: Psychiatry

## 2022-07-20 ENCOUNTER — Ambulatory Visit (INDEPENDENT_AMBULATORY_CARE_PROVIDER_SITE_OTHER): Payer: Medicare PPO | Admitting: Psychiatry

## 2022-07-20 VITALS — BP 126/75 | HR 75 | Temp 97.8°F | Ht 65.0 in | Wt 223.6 lb

## 2022-07-20 DIAGNOSIS — F5101 Primary insomnia: Secondary | ICD-10-CM | POA: Diagnosis not present

## 2022-07-20 DIAGNOSIS — F3342 Major depressive disorder, recurrent, in full remission: Secondary | ICD-10-CM

## 2022-07-20 MED ORDER — VENLAFAXINE HCL ER 75 MG PO CP24: 75.0000 mg | ORAL_CAPSULE | Freq: Two times a day (BID) | ORAL | 1 refills | Status: DC

## 2022-07-20 MED ORDER — BUPROPION HCL ER (XL) 300 MG PO TB24: 300.0000 mg | ORAL_TABLET | Freq: Every morning | ORAL | 1 refills | Status: DC

## 2022-07-20 NOTE — Progress Notes (Unsigned)
BH MD OP Progress Note  07/20/2022 10:57 AM Lisa Chambers  MRN:  [id number]  Chief Complaint:  Chief Complaint  Patient presents with   Follow-up   Anxiety   Depression   Medication Refill   HPI: Lisa Chambers is a 72 year old Caucasian female, married, retired, lives in [place], has a history of MDD, primary insomnia, obstructive sleep apnea on CPAP, asthma, hypertension, history of thyroid nodule, GERD was evaluated in office today.  Patient today reports she is currently doing well.  Denies any sadness, anhedonia, low motivation.  Energy level has improved.  Patient denies any sleep problems.  Continues to use CPAP.  Patient reports she was able to spend some time with her grandchildren in [place].  They are coming to spend some time with her soon.  She looks forward to that.  She is also happy that her husband is currently improving with regards to his health issues.  Patient reports she has started learning [employer].  That has definitely helped her.  Patient today alert, oriented to person place time situation.  3 word memory immediate 3 out of 3, after 5 minutes 2 out of 3.  Attention and concentration fair, was able to do serial sevens.  Patient denies any suicidality, homicidality or perceptual disturbances.  Patient denies any other concerns today.  Visit Diagnosis:    ICD-10-CM   1. MDD (major depressive disorder), recurrent, in full remission (HCC)  F33.42 buPROPion (WELLBUTRIN XL) 300 MG 24 hr tablet    venlafaxine XR (EFFEXOR-XR) 75 MG 24 hr capsule    2. Primary insomnia  F51.01       Past Psychiatric History: I have reviewed past psychiatric history from progress note on 12/13/2019.  Past trials of Ambien, Abilify, Effexor, Adderall, Wellbutrin, Viibryd.  Past Medical History:  Past Medical History:  Diagnosis Date   Allergic rhinitis    Anemia    Asthma    Cancer (HCC)    skin   Chest pain    a. 01/2020 MV: EF 70%, no isch/infarct. CT  attenuation correction images w/ minimal Ao athersclerosis, no significant cor Ca2+.   Depression    GERD (gastroesophageal reflux disease)    Hearing loss    HTN (hypertension)    Hx of migraine headaches    Multinodular goiter    OSA on CPAP    Osteoarthritis    Thyroid disease     Past Surgical History:  Procedure Laterality Date   CHOLECYSTECTOMY     NASAL SINUS SURGERY  1996, 2007   REPLACEMENT TOTAL KNEE Bilateral 2017   TUBAL LIGATION  1994    Family Psychiatric History: I have reviewed family psychiatric history from progress note on 12/13/2019.  Family History:  Family History  Problem Relation Age of Onset   Cancer Mother        breast   Cancer Father        lung   Pneumonia Father    Cancer Maternal Grandmother        colon   Alcohol abuse Brother    Diabetes Brother    Heart attack Brother    COPD Brother    Other Brother        sleep disorder    Social History: I have reviewed social history from progress note on 12/13/2019. Social History   Socioeconomic History   Marital status: Married    Spouse name: Mellody Dance   Number of children: 2   Years of education: Not  on file   Highest education level: Bachelor's degree (e.g., BA, AB, BS)  Occupational History    Comment: retired  Tobacco Use   Smoking status: Never   Smokeless tobacco: Never  Substance and Sexual Activity   Alcohol use: No   Drug use: No   Sexual activity: Not on file  Other Topics Concern   Not on file  Social History Narrative   Lives with husband   Caffeine- diet coke 2 daily   Social Determinants of Health   Financial Resource Strain: Not on file  Food Insecurity: Not on file  Transportation Needs: Not on file  Physical Activity: Not on file  Stress: Not on file  Social Connections: Not on file    Allergies:  Allergies  Allergen Reactions   Aspirin Hives and Swelling    Lips only.  Lips only.  Lips only.   Elemental Sulfur Hives   Erythromycin Other (See  Comments)    GI upset  Other reaction(s): Other (See Comments)  GI upset  GI upset  GI upset  GI upset   Levofloxacin Other (See Comments)    Joint pain  Other reaction(s): Other (See Comments)  Joint pain  Joint pain  Joint pain  Joint pain   Sulfites    Tetracycline Other (See Comments)    GI upset  Other reaction(s): Other (See Comments)  GI upset  GI upset  GI upset  GI upset    Metabolic Disorder Labs: No results found for: "HGBA1C", "MPG" No results found for: "PROLACTIN" No results found for: "CHOL", "TRIG", "HDL", "CHOLHDL", "VLDL", "LDLCALC" No results found for: "TSH"  Therapeutic Level Labs: No results found for: "LITHIUM" No results found for: "VALPROATE" No results found for: "CBMZ"  Current Medications: Current Outpatient Medications  Medication Sig Dispense Refill   albuterol (VENTOLIN HFA) 108 (90 Base) MCG/ACT inhaler Inhale into the lungs.     AMYLASE-LIPASE-PROTEASE PO Take by mouth.     Cholecalciferol 25 MCG (1000 UT) capsule Take by mouth.     cycloSPORINE (RESTASIS) 0.05 % ophthalmic emulsion      estradiol (ESTRACE) 0.1 MG/GM vaginal cream      estradiol (VIVELLE-DOT) 0.05 MG/24HR patch      famotidine (PEPCID) 20 MG tablet Take 20 mg by mouth at bedtime.     Fexofenadine HCl (ALLEGRA PO) Take by mouth as needed.     fluticasone (FLONASE) 50 MCG/ACT nasal spray 2 sprays daily.      Fluticasone-Salmeterol (ADVAIR HFA IN) Inhale into the lungs 2 (two) times daily.     hydrochlorothiazide (HYDRODIURIL) 25 MG tablet Take 1 tablet by mouth daily.     L-Lysine 1000 MG TABS Take 1 tablet by mouth daily.     montelukast (SINGULAIR) 10 MG tablet Take 1 tablet by mouth daily.     Omega-3 Fatty Acids (FISH OIL) 1200 MG CAPS Take by mouth daily in the afternoon.     omeprazole (PRILOSEC) 40 MG capsule Take 40 mg by mouth daily.     potassium chloride SA (KLOR-CON M) 20 MEQ tablet Take 1 tablet (20 mEq total) by mouth daily. 30 tablet 1   Probiotic  Product (PROBIOTIC PO) Take by mouth.     progesterone (PROMETRIUM) 100 MG capsule Take by mouth.     zaleplon (SONATA) 10 MG capsule TAKE 1 CAPSULE BY MOUTH AT BEDTIME AS NEEDED FOR SLEEP. 30 capsule 4   buPROPion (WELLBUTRIN XL) 300 MG 24 hr tablet Take 1 tablet (300 mg total) by  mouth in the morning. TAKE 1 TABLET BY MOUTH EVERY 24 HOURS 90 tablet 1   metoprolol tartrate (LOPRESSOR) 100 MG tablet Take 1 tablet (100 mg total) by mouth once for 1 dose. 1 tablet 0   venlafaxine XR (EFFEXOR-XR) 75 MG 24 hr capsule Take 1 capsule (75 mg total) by mouth 2 (two) times daily with a meal. 180 capsule 1   No current facility-administered medications for this visit.     Musculoskeletal: Strength & Muscle Tone: within normal limits Gait & Station: normal Patient leans: N/A  Psychiatric Specialty Exam: Review of Systems  Psychiatric/Behavioral: Negative.      Blood pressure 126/75, pulse 75, temperature 97.8 F (36.6 C), temperature source Skin, height 5\' 5"  (1.651 m), weight 223 lb 9.6 oz (101.4 kg).Body mass index is 37.21 kg/m.  General Appearance: Casual  Eye Contact:  Good  Speech:  Clear and Coherent  Volume:  Normal  Mood:  Euthymic  Affect:  Appropriate  Thought Process:  Goal Directed and Descriptions of Associations: Intact  Orientation:  Full (Time, Place, and Person)  Thought Content: Logical   Suicidal Thoughts:  No  Homicidal Thoughts:  No  Memory:  Immediate;   Fair Recent;   Fair Remote;   Fair  Judgement:  Fair  Insight:  Fair  Psychomotor Activity:  Normal  Concentration:  Concentration: Fair and Attention Span: Fair  Recall:  [employer] of Knowledge: Fair  Language: Fair  Akathisia:  No  Handed:  Right  AIMS (if indicated): not done  Assets:  Communication Skills Desire for Improvement Housing Social Support  ADL's:  Intact  Cognition: WNL  Sleep:  Fair   Screenings: AIMS    Flowsheet Row Office Visit from 09/29/2021 in [facility] Office Visit from 10/01/2020 in [facility]  AIMS Total Score 0 0      GAD-7    Flowsheet Row Office Visit from 07/20/2022 in [facility] Office Visit from 01/19/2022 in [facility] Office Visit from 09/29/2021 in [facility] Office Visit from 10/01/2020 in [facility]  Total GAD-7 Score 0 2 1 0      Mini-Mental    Flowsheet Row Office Visit from 05/22/2020 in [facility]  Total Score (max 30 points ) 29      PHQ2-9    Flowsheet Row Office Visit from 07/20/2022 in [facility] Counselor from 07/08/2022 in [facility] at [facility] Visit from 01/19/2022 in [facility] Office Visit from 09/29/2021 in [facility] Office Visit from 06/25/2021 in [facility]  PHQ-2 Total Score 0 0 0 1 0  PHQ-9 Total Score 1 -- 2 -- --      Flowsheet Row Office Visit from 07/20/2022 in [facility] Counselor from 07/08/2022 in [facility] at [facility] from 04/30/2022 in [facility] at [facility] RISK CATEGORY No Risk No Risk No Risk        Assessment and Plan: Lisa Chambers is a 71 year old Caucasian female, retired, married, has a history of depression, multiple medical problems including obstructive sleep apnea with CPAP, was evaluated in office today.  Patient is currently stable.  Plan MDD in remission Venlafaxine extended  release 75 mg p.o. twice daily Wellbutrin XL 300 mg p.o. daily   Primary insomnia-stable Sonata 10 mg p.o.  nightly. Reviewed [place] PMP AWARxE  Follow-up in clinic in 6 months or sooner if needed.   Consent: Patient/Guardian gives verbal consent for treatment and assignment of benefits for services provided during this visit. Patient/Guardian expressed understanding and agreed to proceed.   This note was generated in part or whole with voice recognition software. Voice recognition is usually quite accurate but there are transcription errors that can and very often do occur. I apologize for any typographical errors that were not detected and corrected.      Jomarie Longs, MD 07/20/2022, 10:57 AM

## 2022-07-22 ENCOUNTER — Telehealth (HOSPITAL_COMMUNITY): Payer: Self-pay | Admitting: Emergency Medicine

## 2022-07-22 NOTE — Telephone Encounter (Signed)
Reaching out to patient to offer assistance regarding upcoming cardiac imaging study; pt verbalizes understanding of appt date/time, parking situation and where to check in, pre-test NPO status and medications ordered, and verified current allergies; name and call back number provided for further questions should they arise Radiah Lubinski RN Navigator Cardiac Imaging [facility] [phone] office [phone] cell 

## 2022-07-23 ENCOUNTER — Ambulatory Visit
Admission: RE | Admit: 2022-07-23 | Discharge: 2022-07-23 | Disposition: A | Discharge status: Home / Self Care | Payer: Medicare PPO | Source: Ambulatory Visit | Attending: Nurse Practitioner | Admitting: Nurse Practitioner

## 2022-07-23 DIAGNOSIS — I10 Essential (primary) hypertension: Secondary | ICD-10-CM | POA: Diagnosis not present

## 2022-07-23 DIAGNOSIS — R0609 Other forms of dyspnea: Secondary | ICD-10-CM | POA: Diagnosis present

## 2022-07-23 DIAGNOSIS — I2510 Atherosclerotic heart disease of native coronary artery without angina pectoris: Secondary | ICD-10-CM | POA: Diagnosis not present

## 2022-07-23 DIAGNOSIS — R072 Precordial pain: Secondary | ICD-10-CM

## 2022-07-23 MED ORDER — IOHEXOL 350 MG/ML SOLN
100.0000 mL | Freq: Once | INTRAVENOUS | Status: AC | PRN
  Administered 2022-07-23: 100 mL via INTRAVENOUS

## 2022-07-23 MED ORDER — NITROGLYCERIN 0.4 MG SL SUBL
0.8000 mg | SUBLINGUAL_TABLET | Freq: Once | SUBLINGUAL | Status: AC
  Administered 2022-07-23: 0.8 mg via SUBLINGUAL

## 2022-07-23 NOTE — Progress Notes (Signed)
Patient tolerated procedure well. Ambulate w/o difficulty. Denies light headedness or being dizzy. Sitting in chair drinking water provided. Encouraged to drink extra water today and reasoning explained. Verbalized understanding. All questions answered. ABC intact. No further needs. Discharge from procedure area w/o issues.   °

## 2022-07-29 ENCOUNTER — Ambulatory Visit: Payer: Medicare PPO | Admitting: Nurse Practitioner

## 2022-07-29 ENCOUNTER — Other Ambulatory Visit: Payer: Self-pay | Admitting: *Deleted

## 2022-07-29 MED ORDER — POTASSIUM CHLORIDE CRYS ER 20 MEQ PO TBCR: 20.0000 meq | EXTENDED_RELEASE_TABLET | Freq: Every day | ORAL | 0 refills | Status: DC

## 2022-08-07 ENCOUNTER — Ambulatory Visit: Payer: Medicare PPO | Attending: Cardiovascular Disease | Admitting: Cardiovascular Disease

## 2022-08-07 ENCOUNTER — Encounter: Payer: Self-pay | Admitting: Cardiovascular Disease

## 2022-08-07 VITALS — BP 120/80 | HR 71 | Ht 65.0 in | Wt 218.5 lb

## 2022-08-07 DIAGNOSIS — I10 Essential (primary) hypertension: Secondary | ICD-10-CM

## 2022-08-07 DIAGNOSIS — E785 Hyperlipidemia, unspecified: Secondary | ICD-10-CM | POA: Diagnosis not present

## 2022-08-07 DIAGNOSIS — R0609 Other forms of dyspnea: Secondary | ICD-10-CM | POA: Diagnosis not present

## 2022-08-07 NOTE — Patient Instructions (Signed)
Medication Instructions:  No changes *If you need a refill on your cardiac medications before your next appointment, please call your pharmacy*   Lab Work: None ordered If you have labs (blood work) drawn today and your tests are completely normal, you will receive your results only by: MyChart Message (if you have MyChart) OR A paper copy in the mail If you have any lab test that is abnormal or we need to change your treatment, we will call you to review the results.   Testing/Procedures: None ordered   Follow-Up: At [facility], you and your health needs are our priority.  As part of our continuing mission to provide you with exceptional heart care, we have created designated Provider Care Teams.  These Care Teams include your primary Cardiologist (physician) and Advanced Practice Providers (APPs -  Physician Assistants and Nurse Practitioners) who all work together to provide you with the care you need, when you need it.  We recommend signing up for the patient portal called "MyChart".  Sign up information is provided on this After Visit Summary.  MyChart is used to connect with patients for Virtual Visits (Telemedicine).  Patients are able to view lab/test results, encounter notes, upcoming appointments, etc.  Non-urgent messages can be sent to your provider as well.   To learn more about what you can do with MyChart, go to [email or web address].    Your next appointment:   Follow up as needed with Dr. Arida 

## 2022-08-07 NOTE — Progress Notes (Signed)
Cardiology Office Note   Date:  08/07/2022   ID:  Lisa Chambers, Lisa Chambers [birth year], MRN [id number]  PCP:  Lauro Regulus, MD  Cardiologist:   Lorine Bears, MD   Chief Complaint  Patient presents with   Follow-up    F/u CT no complaints today. Meds reviewed verbally with pt.      History of Present Illness: Lisa Chambers is a 72 y.o. female who presents for a follow-up visit regarding exertional dyspnea.   She has known history of essential hypertension, obesity and hyperlipidemia.  She is not diabetic or smoker.  Her younger brother had CABG and has history of peripheral arterial disease.   She was seen in 2021 for intermittent atypical chest pain.  Lexiscan Myoview showed no evidence of ischemia with normal ejection fraction.   She was most recently seen by Ward Givens in May for increased exertional dyspnea and fatigue.  She underwent cardiac CTA this month which showed a calcium score of 19 with minimal nonobstructive coronary artery disease.  She has been doing well overall with no chest pain.  She reports improvement in exertional dyspnea.  She started weight watchers and is planning to lose weight and follow healthy lifestyle.   Past Medical History:  Diagnosis Date   Allergic rhinitis    Anemia    Asthma    Cancer (HCC)    skin   Chest pain    a. 01/2020 MV: EF 70%, no isch/infarct. CT attenuation correction images w/ minimal Ao athersclerosis, no significant cor Ca2+.   Depression    GERD (gastroesophageal reflux disease)    Hearing loss    HTN (hypertension)    Hx of migraine headaches    Multinodular goiter    OSA on CPAP    Osteoarthritis    Thyroid disease     Past Surgical History:  Procedure Laterality Date   CHOLECYSTECTOMY     NASAL SINUS SURGERY  1996, 2007   REPLACEMENT TOTAL KNEE Bilateral 2017   TUBAL LIGATION  1994     Current Outpatient Medications  Medication Sig Dispense Refill   albuterol (VENTOLIN HFA) 108 (90 Base) MCG/ACT  inhaler Inhale into the lungs.     AMYLASE-LIPASE-PROTEASE PO Take by mouth.     buPROPion (WELLBUTRIN XL) 300 MG 24 hr tablet Take 1 tablet (300 mg total) by mouth in the morning. TAKE 1 TABLET BY MOUTH EVERY 24 HOURS 90 tablet 1   Cholecalciferol 25 MCG (1000 UT) capsule Take by mouth.     cycloSPORINE (RESTASIS) 0.05 % ophthalmic emulsion      estradiol (ESTRACE) 0.1 MG/GM vaginal cream      estradiol (VIVELLE-DOT) 0.05 MG/24HR patch      Fexofenadine HCl (ALLEGRA PO) Take by mouth as needed.     fluticasone (FLONASE) 50 MCG/ACT nasal spray 2 sprays daily.      Fluticasone-Salmeterol (ADVAIR HFA IN) Inhale into the lungs 2 (two) times daily.     hydrochlorothiazide (HYDRODIURIL) 25 MG tablet Take 1 tablet by mouth daily.     L-Lysine 1000 MG TABS Take 1 tablet by mouth daily.     montelukast (SINGULAIR) 10 MG tablet Take 1 tablet by mouth daily.     Omega-3 Fatty Acids (FISH OIL) 1200 MG CAPS Take by mouth daily in the afternoon.     omeprazole (PRILOSEC) 40 MG capsule Take 40 mg by mouth daily.     potassium chloride SA (KLOR-CON M) 20 MEQ tablet Take 1 tablet (  20 mEq total) by mouth daily. 90 tablet 0   Probiotic Product (PROBIOTIC PO) Take by mouth.     progesterone (PROMETRIUM) 100 MG capsule Take by mouth.     venlafaxine XR (EFFEXOR-XR) 75 MG 24 hr capsule Take 1 capsule (75 mg total) by mouth 2 (two) times daily with a meal. 180 capsule 1   zaleplon (SONATA) 10 MG capsule TAKE 1 CAPSULE BY MOUTH AT BEDTIME AS NEEDED FOR SLEEP. 30 capsule 4   No current facility-administered medications for this visit.    Allergies:   Aspirin, Elemental sulfur, Erythromycin, Levofloxacin, Sulfites, and Tetracycline    Social History:  The patient  reports that she has never smoked. She has never used smokeless tobacco. She reports that she does not drink alcohol and does not use drugs.   Family History:  The patient's family history includes Alcohol abuse in her brother; COPD in her brother;  Cancer in her father, maternal grandmother, and mother; Diabetes in her brother; Heart attack in her brother; Other in her brother; Pneumonia in her father.    ROS:  Please see the history of present illness.   Otherwise, review of systems are positive for none.   All other systems are reviewed and negative.    PHYSICAL EXAM: VS:  BP 120/80 (BP Location: Left Arm, Patient Position: Sitting, Cuff Size: Large)   Pulse 71   Ht 5\' 5"  (1.651 m)   Wt 218 lb 8 oz (99.1 kg)   SpO2 97%   BMI 36.36 kg/m  , BMI Body mass index is 36.36 kg/m. GEN: Well nourished, well developed, in no acute distress  HEENT: normal  Neck: no JVD, carotid bruits, or masses Cardiac: RRR; no murmurs, rubs, or gallops,no edema  Respiratory:  clear to auscultation bilaterally, normal work of breathing GI: soft, nontender, nondistended, + BS MS: no deformity or atrophy  Skin: warm and dry, no rash Neuro:  Strength and sensation are intact Psych: euthymic mood, full affect   EKG:  EKG is not ordered today.    Recent Labs: 07/15/2022: BUN 15; Creatinine, Ser 0.82; Potassium 3.7; Sodium 137    Lipid Panel No results found for: "CHOL", "TRIG", "HDL", "CHOLHDL", "VLDL", "LDLCALC", "LDLDIRECT"    Wt Readings from Last 3 Encounters:  08/07/22 218 lb 8 oz (99.1 kg)  06/25/22 221 lb 3.2 oz (100.3 kg)  05/22/20 225 lb (102.1 kg)           01/18/2020    3:05 PM  PAD Screen  Previous PAD dx? No  Previous surgical procedure? Yes  Pain with walking? No  Feet/toe relief with dangling? No  Painful, non-healing ulcers? No  Extremities discolored? No      ASSESSMENT AND PLAN:  1.  Exertional dyspnea: Cardiac CTA showed minimal nonobstructive coronary artery disease.  There is likely a component of physical deconditioning but she seems to be improving with healthy lifestyle changes.  No signs of congestive heart failure.  2.  Essential hypertension: Blood pressure is controlled on current medications.  She  was hypokalemic on hydrochlorothiazide but that has improved with addition of potassium chloride.  She is interested in an alternative blood pressure medication that does not require taking potassium.  A potassium sparing diuretic or an ARB or reasonable alternatives and she will discuss with her primary care physician.  3.  Hyperlipidemia: Most recent lipid profile showed an LDL of 103.  Given that her calcium score was only minimally elevated, I think it is reasonable to  continue with healthy lifestyle changes for now.    Disposition:   FU with me as needed.  Signed,  Lorine Bears, MD  08/07/2022 9:21 AM    [facility]

## 2022-08-20 ENCOUNTER — Ambulatory Visit: Payer: Medicare PPO | Admitting: Cardiovascular Disease

## 2022-08-26 ENCOUNTER — Ambulatory Visit (HOSPITAL_COMMUNITY): Payer: Medicare PPO | Admitting: Licensed Clinical Social Worker

## 2022-09-01 ENCOUNTER — Encounter: Payer: Self-pay | Admitting: Family

## 2022-09-01 ENCOUNTER — Encounter: Payer: Self-pay | Admitting: *Deleted

## 2022-09-01 ENCOUNTER — Ambulatory Visit
Admission: RE | Admit: 2022-09-01 | Discharge: 2022-09-01 | Disposition: A | Discharge status: Home / Self Care | Payer: Medicare PPO | Source: Ambulatory Visit | Attending: Family | Admitting: Family

## 2022-09-01 ENCOUNTER — Ambulatory Visit: Payer: Medicare PPO | Admitting: Family

## 2022-09-01 VITALS — BP 138/82 | HR 82 | Temp 98.0°F | Ht 65.0 in | Wt 216.0 lb

## 2022-09-01 DIAGNOSIS — R7303 Prediabetes: Secondary | ICD-10-CM

## 2022-09-01 DIAGNOSIS — H04123 Dry eye syndrome of bilateral lacrimal glands: Secondary | ICD-10-CM

## 2022-09-01 DIAGNOSIS — E042 Nontoxic multinodular goiter: Secondary | ICD-10-CM

## 2022-09-01 DIAGNOSIS — R1031 Right lower quadrant pain: Secondary | ICD-10-CM | POA: Diagnosis not present

## 2022-09-01 DIAGNOSIS — T502X5A Adverse effect of carbonic-anhydrase inhibitors, benzothiadiazides and other diuretics, initial encounter: Secondary | ICD-10-CM | POA: Diagnosis not present

## 2022-09-01 DIAGNOSIS — E876 Hypokalemia: Secondary | ICD-10-CM | POA: Diagnosis not present

## 2022-09-01 DIAGNOSIS — R413 Other amnesia: Secondary | ICD-10-CM

## 2022-09-01 DIAGNOSIS — F3342 Major depressive disorder, recurrent, in full remission: Secondary | ICD-10-CM

## 2022-09-01 DIAGNOSIS — E782 Mixed hyperlipidemia: Secondary | ICD-10-CM

## 2022-09-01 DIAGNOSIS — Z79890 Hormone replacement therapy (postmenopausal): Secondary | ICD-10-CM | POA: Diagnosis not present

## 2022-09-01 DIAGNOSIS — I10 Essential (primary) hypertension: Secondary | ICD-10-CM | POA: Diagnosis not present

## 2022-09-01 DIAGNOSIS — J4520 Mild intermittent asthma, uncomplicated: Secondary | ICD-10-CM

## 2022-09-01 LAB — CBC WITH DIFFERENTIAL/PLATELET
Basophils Absolute: 0.1 10*3/uL (ref 0.0–0.1)
Basophils Relative: 1.8 % (ref 0.0–3.0)
Eosinophils Absolute: 0.3 10*3/uL (ref 0.0–0.7)
Eosinophils Relative: 3.9 % (ref 0.0–5.0)
HCT: 44.1 % (ref 36.0–46.0)
Hemoglobin: 14.1 g/dL (ref 12.0–15.0)
Lymphocytes Relative: 24.6 % (ref 12.0–46.0)
Lymphs Abs: 1.8 10*3/uL (ref 0.7–4.0)
MCHC: 32 g/dL (ref 30.0–36.0)
MCV: 83.6 fl (ref 78.0–100.0)
Monocytes Absolute: 0.9 10*3/uL (ref 0.1–1.0)
Monocytes Relative: 12.7 % — ABNORMAL HIGH (ref 3.0–12.0)
Neutro Abs: 4.2 10*3/uL (ref 1.4–7.7)
Neutrophils Relative %: 57 % (ref 43.0–77.0)
Platelets: 331 10*3/uL (ref 150.0–400.0)
RBC: 5.27 Mil/uL — ABNORMAL HIGH (ref 3.87–5.11)
RDW: 13.7 % (ref 11.5–15.5)
WBC: 7.4 10*3/uL (ref 4.0–10.5)

## 2022-09-01 LAB — MICROALBUMIN / CREATININE URINE RATIO
Creatinine,U: 33.6 mg/dL
Microalb Creat Ratio: 2.1 mg/g (ref 0.0–30.0)
Microalb, Ur: <0.7 mg/dL (ref 0.0–1.9)

## 2022-09-01 LAB — LIPID PANEL
Cholesterol: 163 mg/dL (ref 0–200)
HDL: 46.8 mg/dL (ref >39.00)
LDL Cholesterol: 86 mg/dL (ref 0–99)
NonHDL: 115.73
Total CHOL/HDL Ratio: 3
Triglycerides: 149 mg/dL (ref 0.0–149.0)
VLDL: 29.8 mg/dL (ref 0.0–40.0)

## 2022-09-01 LAB — BASIC METABOLIC PANEL
BUN: 13 mg/dL (ref 6–23)
CO2: 28 mEq/L (ref 19–32)
Calcium: 9.6 mg/dL (ref 8.4–10.5)
Chloride: 99 mEq/L (ref 96–112)
Creatinine, Ser: 0.69 mg/dL (ref 0.40–1.20)
GFR: 88.38 mL/min (ref >60.00)
Glucose, Bld: 89 mg/dL (ref 70–99)
Potassium: 4.1 mEq/L (ref 3.5–5.1)
Sodium: 136 mEq/L (ref 135–145)

## 2022-09-01 LAB — TSH: TSH: 3.13 u[IU]/mL (ref 0.35–5.50)

## 2022-09-01 LAB — T4, FREE: Free T4: 0.81 ng/dL (ref 0.60–1.60)

## 2022-09-01 LAB — VITAMIN B12: Vitamin B-12: 222 pg/mL (ref 211–911)

## 2022-09-01 MED ORDER — IOHEXOL 9 MG/ML PO SOLN
500.0000 mL | ORAL | Status: AC
  Administered 2022-09-01 (×2): 500 mL via ORAL

## 2022-09-01 MED ORDER — ESTRADIOL 0.025 MG/24HR TD PTTW
1.0000 | MEDICATED_PATCH | TRANSDERMAL | 12 refills | Status: DC
Start: 2022-09-03 — End: 2023-08-30

## 2022-09-01 MED ORDER — IOHEXOL 300 MG/ML  SOLN
100.0000 mL | Freq: Once | INTRAMUSCULAR | Status: AC | PRN
  Administered 2022-09-01: 100 mL via INTRAVENOUS

## 2022-09-01 NOTE — Patient Instructions (Addendum)
  Decreased estradiol to 0.025 mg twice weekly for 3-4 weeks  I have ordered imaging for you at [facility] outpatient diagnostic center for a thyroid ultrasound. This order has been sent over for you electronically.  Please call [phone] to schedule this appointment.  Welcome to our clinic, I am happy to have you as my new patient. I am excited to continue on this healthcare journey with you.  ------------------------------------  Stop by the lab prior to leaving today. I will notify you of your results once received.   Please keep in mind Any my chart messages you send have up to a three business day turnaround for a response.  Phone calls may take up to a one full business day turnaround for a  response.   If you need a medication refill I recommend you request it through the pharmacy as this is easiest for [place] rather than sending a message and or phone call.   Due to recent changes in healthcare laws, you may see results of your imaging and/or laboratory studies on MyChart before I have had a chance to review them.  I understand that in some cases there may be results that are confusing or concerning to you. Please understand that not all results are received at the same time and often I may need to interpret multiple results in order to provide you with the best plan of care or course of treatment. Therefore, I ask that you please give me 2 business days to thoroughly review all your results before contacting my office for clarification. Should we see a critical lab result, you will be contacted sooner.   It was a pleasure seeing you today! Please do not hesitate to reach out with any questions and or concerns.  Regards,   Mort Sawyers FNP-C

## 2022-09-01 NOTE — Assessment & Plan Note (Signed)
Stat cbc  Cmp amylase lipase ordered Urinalysis with culture reflex ordered pending results.  Stat CT abd pelvis Ddx diverticulitis , appendicitis (low suspicion)  Advised pt of red flag symptoms and when to go to ER

## 2022-09-01 NOTE — Assessment & Plan Note (Signed)
Continue restasis and f/u with opthamology

## 2022-09-01 NOTE — Assessment & Plan Note (Signed)
Stable continue advair albuterol prn

## 2022-09-01 NOTE — Assessment & Plan Note (Signed)
Repeat potassium pending results

## 2022-09-01 NOTE — Assessment & Plan Note (Signed)
Stable continue hydrochlorothiazide will check potassium as prior low

## 2022-09-01 NOTE — Assessment & Plan Note (Addendum)
D/w her the risks/benefits of continuing especially with h/o family breast cancer with mom.  She is ok with trying to wean.  Decrease and RX given for estradiol patch 0.025 twice weekly. Will have her f/u in two weeks to see how she is feeling. Once off of estradiol will d/c progesterone

## 2022-09-01 NOTE — Assessment & Plan Note (Signed)
Stable continue with psychiatry

## 2022-09-01 NOTE — Assessment & Plan Note (Signed)
Ordering tsh and free t4  Last u/s thyroid 72 y/o will order and repeat u/s pending results.

## 2022-09-01 NOTE — Progress Notes (Signed)
New Patient Office Visit  Subjective:  Patient ID: Lisa Chambers, female    DOB: 1950-02-23  Age: 72 y.o. MRN: [id number]  CC: No chief complaint on file.   HPI Lisa Chambers is here to establish care as a new patient.  Oriented to practice routines and expectations.  Prior provider was: Dr. Einar Crow, MD over at [facility]   Pt is with acute concerns.  C/o Vaginal itching, ongoing over the last year on and off. Scratches most often at night. Uses estrace twice weekly via patch. She states only uses cream prn not regularly.   She also does state 'intense' pain on her right abdominal side. Will bend her over when it comes on. Doesn't necessary occur after she is eating. Started four days. Happens three to four times a day. No increased flatulence. Regular bowel movement. No nausea. No vomiting.    chronic concerns:  Multinodular thyroid, goiter: states frequent f/u with ultrasounds. No change per pt recollection.   MDD: on wellbutrin 300 mg XL once daily as well as venlafaxine 75 mg two times daily, sees Dr. Elna Breslow. Also on sonata for sleep disorder as well.   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 On restasis.   HTN: on hydrochlorothiazide doing well.   Cardiology, Dr. Kirke Corin.    HTN: on hydrochlorothiazide 25 mg once daily  ROS: Negative unless specifically indicated above in HPI.   Current Outpatient Medications:    albuterol (VENTOLIN HFA) 108 (90 Base) MCG/ACT inhaler, Inhale into the lungs., Disp: , Rfl:    buPROPion (WELLBUTRIN XL) 300 MG 24 hr tablet, Take 1 tablet (300 mg total) by mouth in the morning. TAKE 1 TABLET BY MOUTH EVERY 24 HOURS, Disp: 90 tablet, Rfl: 1   Cholecalciferol 25 MCG (1000 UT) capsule, Take by mouth., Disp: , Rfl:    cycloSPORINE (RESTASIS) 0.05 % ophthalmic emulsion, , Disp: , Rfl:    estradiol (ESTRACE) 0.1 MG/GM vaginal cream, , Disp: , Rfl:    [START ON 09/03/2022] estradiol (VIVELLE-DOT) 0.025 MG/24HR, Place 1 patch onto the  skin 2 (two) times a week., Disp: 8 patch, Rfl: 12   Fexofenadine HCl (ALLEGRA PO), Take by mouth as needed., Disp: , Rfl:    fluticasone (FLONASE) 50 MCG/ACT nasal spray, 2 sprays daily. , Disp: , Rfl:    Fluticasone-Salmeterol (ADVAIR HFA IN), Inhale into the lungs 2 (two) times daily., Disp: , Rfl:    hydrochlorothiazide (HYDRODIURIL) 25 MG tablet, Take 1 tablet by mouth daily., Disp: , Rfl:    L-Lysine 1000 MG TABS, Take 1 tablet by mouth daily., Disp: , Rfl:    montelukast (SINGULAIR) 10 MG tablet, Take 1 tablet by mouth daily., Disp: , Rfl:    Omega-3 Fatty Acids (FISH OIL) 1200 MG CAPS, Take by mouth daily in the afternoon., Disp: , Rfl:    omeprazole (PRILOSEC) 40 MG capsule, Take 40 mg by mouth daily., Disp: , Rfl:    Probiotic Product (PROBIOTIC PO), Take by mouth., Disp: , Rfl:    progesterone (PROMETRIUM) 100 MG capsule, Take by mouth., Disp: , Rfl:    venlafaxine XR (EFFEXOR-XR) 75 MG 24 hr capsule, Take 1 capsule (75 mg total) by mouth 2 (two) times daily with a meal., Disp: 180 capsule, Rfl: 1   zaleplon (SONATA) 10 MG capsule, TAKE 1 CAPSULE BY MOUTH AT BEDTIME AS NEEDED FOR SLEEP., Disp: 30 capsule, Rfl: 4 Past Medical History:  Diagnosis Date   Allergic rhinitis    Anemia  Asthma    Cancer (HCC)    skin   Chest pain    a. 01/2020 MV: EF 70%, no isch/infarct. CT attenuation correction images w/ minimal Ao athersclerosis, no significant cor Ca2+.   Depression    GERD (gastroesophageal reflux disease)    Hearing loss    HTN (hypertension)    Hx of migraine headaches    Multinodular goiter    OSA on CPAP    Osteoarthritis    Thyroid disease    Past Surgical History:  Procedure Laterality Date   BREAST BIOPSY Right 2021   fibroadenoma, benign   CHOLECYSTECTOMY     NASAL SINUS SURGERY  1996, 2007   REPLACEMENT TOTAL KNEE Bilateral 2017   TUBAL LIGATION  1994    Objective:   Today's Vitals: BP 138/82   Pulse 82   Temp 98 F (36.7 C) (Temporal)   Ht 5\' 5"   (1.651 m)   Wt 216 lb (98 kg)   SpO2 97%   BMI 35.94 kg/m   Physical Exam Constitutional:      General: She is not in acute distress.    Appearance: Normal appearance. She is normal weight. She is not ill-appearing, toxic-appearing or diaphoretic.  HENT:     Head: Normocephalic.  Cardiovascular:     Rate and Rhythm: Normal rate and regular rhythm.  Pulmonary:     Effort: Pulmonary effort is normal.  Abdominal:     General: Abdomen is flat. Bowel sounds are normal.     Tenderness: There is abdominal tenderness in the right lower quadrant. There is guarding. There is no rebound. Negative signs include Murphy's sign, Rovsing's sign, McBurney's sign, psoas sign and obturator sign.  Musculoskeletal:        General: Normal range of motion.  Neurological:     General: No focal deficit present.     Mental Status: She is alert and oriented to person, place, and time. Mental status is at baseline.  Psychiatric:        Mood and Affect: Mood normal.        Behavior: Behavior normal.        Thought Content: Thought content normal.        Judgment: Judgment normal.     Assessment & Plan:  Hormone replacement therapy Assessment & Plan: D/w her the risks/benefits of continuing especially with h/o family breast cancer with mom.  She is ok with trying to wean.  Decrease and RX given for estradiol patch 0.025 twice weekly. Will have her f/u in two weeks to see how she is feeling. Once off of estradiol will d/c progesterone   Orders: -     Estradiol; Place 1 patch onto the skin 2 (two) times a week.  Dispense: 8 patch; Refill: 12  Goiter, nontoxic, multinodular Assessment & Plan: Ordering tsh and free t4  Last u/s thyroid 72 y/o will order and repeat u/s pending results.   Orders: -     US THYROID; Future -     TSH -     T4, free  Multinodular goiter -     US THYROID; Future  Diuretic-induced hypokalemia Assessment & Plan: Repeat potassium pending results  Orders: -     Basic  metabolic panel  Mild intermittent asthma without complication Assessment & Plan: Stable continue advair albuterol prn   Low calcium levels Assessment & Plan: Repeat bmp pending results  Orders: -     Basic metabolic panel  Memory loss Assessment & Plan: B12  ordered pending results  Orders: -     Vitamin B12  Hypertension, benign Assessment & Plan: Stable continue hydrochlorothiazide will check potassium as prior low   Orders: -     Microalbumin / creatinine urine ratio  RLQ abdominal pain Assessment & Plan: Stat cbc  Cmp amylase lipase ordered Urinalysis with culture reflex ordered pending results.  Stat CT abd pelvis Ddx diverticulitis , appendicitis (low suspicion)  Advised pt of red flag symptoms and when to go to ER  Orders: -     Urinalysis w microscopic + reflex cultur -     CBC with Differential/Platelet -     CT ABDOMEN PELVIS W CONTRAST; Future  Dry eyes Assessment & Plan: Continue restasis and f/u with opthamology   Mixed hyperlipidemia Assessment & Plan: Ordered lipid panel, pending results. Work on low cholesterol diet and exercise as tolerated   Orders: -     Lipid panel  MDD (major depressive disorder), recurrent, in full remission (HCC) Assessment & Plan: Stable continue with psychiatry    Prediabetes Assessment & Plan: Pt advised of the following: Work on a diabetic diet, try to incorporate exercise at least 20-30 a day for 3 days a week or more.       Follow-up: Return in about 2 weeks (around 09/15/2022) for f/u hormone therapy .   Mort Sawyers, FNP

## 2022-09-01 NOTE — Assessment & Plan Note (Signed)
Ordered lipid panel, pending results. Work on low cholesterol diet and exercise as tolerated  

## 2022-09-01 NOTE — Assessment & Plan Note (Signed)
Repeat bmp pending results 

## 2022-09-01 NOTE — Assessment & Plan Note (Signed)
B12 ordered pending results °

## 2022-09-01 NOTE — Assessment & Plan Note (Signed)
Pt advised of the following: Work on a diabetic diet, try to incorporate exercise at least 20-30 a day for 3 days a week or more.   

## 2022-09-02 LAB — URINALYSIS W MICROSCOPIC + REFLEX CULTURE
Bacteria, UA: NONE SEEN /HPF
Bilirubin Urine: NEGATIVE
Hgb urine dipstick: NEGATIVE
Ketones, ur: NEGATIVE
Nitrites, Initial: NEGATIVE
Protein, ur: NEGATIVE
pH: 6.5 (ref 5.0–8.0)

## 2022-09-02 LAB — CULTURE INDICATED

## 2022-09-03 ENCOUNTER — Encounter: Payer: Self-pay | Admitting: Family

## 2022-09-03 LAB — URINE CULTURE
MICRO NUMBER:: 15237926
SPECIMEN QUALITY:: ADEQUATE

## 2022-09-03 LAB — URINALYSIS W MICROSCOPIC + REFLEX CULTURE
Glucose, UA: NEGATIVE
Hyaline Cast: NONE SEEN /LPF
RBC / HPF: NONE SEEN /HPF (ref 0–2)
Specific Gravity, Urine: 1.007 (ref 1.001–1.035)
WBC, UA: NONE SEEN /HPF (ref 0–5)

## 2022-09-03 NOTE — Progress Notes (Signed)
Does pt feel she has any sob or doe? Any new cough?  Any recent fever, increased fatigue?  We will repeat CT in three months to monitor stability? Smoking history and for how many years if so?   There is a lot of stool, suggesting constipation which I assume belly pain is from. I recommend miralax daily if no relief have her try mineral enema over the counter or magnesium citrate.   With fatty liver. Work on diet and exercise.

## 2022-09-04 ENCOUNTER — Telehealth: Payer: Self-pay

## 2022-09-04 NOTE — Telephone Encounter (Signed)
-----   Message from [place] sent at 09/03/2022  6:31 PM EDT ----- Does pt feel she has any sob or doe? Any new cough?  Any recent fever, increased fatigue?  We will repeat CT in three months to monitor stability? Smoking history and for how many years if so?   There is a lot of stool, suggesting constipation which I assume belly pain is from. I recommend miralax daily if no relief have her try mineral enema over the counter or magnesium citrate.   With fatty liver. Work on diet and exercise.

## 2022-09-04 NOTE — Telephone Encounter (Signed)
LMTCB

## 2022-09-04 NOTE — Telephone Encounter (Signed)
Noted  

## 2022-09-04 NOTE — Telephone Encounter (Signed)
Pt called back returning Ashley's call. Told pt Dugal's response. Pt states she doesn't have any sob or doe. Pt states she "keeps a cough" especially early in the mornings, but its nothing usual. Pt states she doesn't have a smoking history. Pt had no questions or concerns regarding the repeat of scan in 3 months, instructions on taking suggested miralax or enema or magnesium, if there's no relief or diet/exercise. Call back # [phone]

## 2022-09-08 ENCOUNTER — Ambulatory Visit
Admission: RE | Admit: 2022-09-08 | Discharge: 2022-09-08 | Disposition: A | Discharge status: Home / Self Care | Payer: Medicare PPO | Source: Ambulatory Visit | Attending: Family | Admitting: Family

## 2022-09-08 ENCOUNTER — Other Ambulatory Visit: Payer: Medicare PPO

## 2022-09-08 DIAGNOSIS — E042 Nontoxic multinodular goiter: Secondary | ICD-10-CM | POA: Insufficient documentation

## 2022-09-09 ENCOUNTER — Ambulatory Visit (INDEPENDENT_AMBULATORY_CARE_PROVIDER_SITE_OTHER): Payer: Medicare PPO | Admitting: Licensed Clinical Social Worker

## 2022-09-09 DIAGNOSIS — F4320 Adjustment disorder, unspecified: Secondary | ICD-10-CM | POA: Diagnosis not present

## 2022-09-09 NOTE — Progress Notes (Signed)
  THERAPIST PROGRESS NOTE  Session Time: 1010-1110a  Behavioral Health Outpatient [facility] in office visit for patient and LCSW clinician  Participation Level: Active  Behavioral Response: NeatAlertAnxious  Type of Therapy: Individual Therapy  Treatment Goals addressed: Reduce overall frequency, intensity, and duration of the anxiety so that daily functioning is not impaired  as evidenced by pt self report 3 out of 5 sessions documented.    ProgressTowards Goals: Progressing  Interventions: CBT  Summary: Lisa Chambers is a 72 y.o. female who presents with improving symptoms related to adjustment disorder diagnosis.  Patient reports that overall mood has been stable, and that she is managing situational stressors well.  Patient reports good quality and quantity of sleep.  Pt reports good appetite.   Assisted pt with identifying stress associated with caring for their husband with parkinson's diagnosis and with recent completed surgical procedure of melanoma on leg. Provided pt with positive support and encouragement as pt explored additional stressors. Reviewed importance of pt prioritizing self care.   Recommended book "Meet me where I am" focused on caregivers to individuals w/ memory loss. Encouraged pt to keep data about concerns re: husband to share with neurology providers.   Continued recommendations are as follows: self care behaviors, positive social engagements, focusing on overall work/home/life balance, and focusing on positive physical and emotional wellness.   Suicidal/Homicidal: No  Therapist Response: Pt is continuing to apply interventions learned in session into daily life situations. Pt is currently on track to meet goals utilizing interventions mentioned above. Personal growth and progress noted. Treatment to continue as indicated.   Patient is continuing to build and develop overall behaviors to improve self-care, and improving coping strategies and mechanisms.   Patient is being intentional about engaging in positive activities.  Patient is able to identify support system.  Plan: Informed patient that clinician will be leaving outpatient department. Allowed pt to explore any questions or concerns and discussed future counseling options/resources. Provided pt with psychoeducational resources and list of OPT therapists. Encouraged pt to continue with psychiatric med management appointments, if applicable.   Diagnosis:  Encounter Diagnosis  Name Primary?   Adjustment disorder, unspecified type Yes   Collaboration of Care: Other pt encouraged to continue care with psychiatrist of record, Dr Elna Breslow  Patient/Guardian was advised Release of Information must be obtained prior to any record release in order to collaborate their care with an outside provider. Patient/Guardian was advised if they have not already done so to contact the registration department to sign all necessary forms in order for [place] to release information regarding their care.   Consent: Patient/Guardian gives verbal consent for treatment and assignment of benefits for services provided during this visit. Patient/Guardian expressed understanding and agreed to proceed.   Lisa Haber Glennice Marcos, LCSW 09/09/2022

## 2022-09-15 ENCOUNTER — Ambulatory Visit: Payer: Medicare PPO | Admitting: Family

## 2022-09-16 ENCOUNTER — Ambulatory Visit: Payer: Medicare PPO | Admitting: Family

## 2022-09-16 ENCOUNTER — Encounter: Payer: Self-pay | Admitting: Family

## 2022-09-16 VITALS — BP 128/82 | HR 93 | Temp 97.7°F | Ht 63.0 in | Wt 216.4 lb

## 2022-09-16 DIAGNOSIS — E782 Mixed hyperlipidemia: Secondary | ICD-10-CM

## 2022-09-16 DIAGNOSIS — N898 Other specified noninflammatory disorders of vagina: Secondary | ICD-10-CM

## 2022-09-16 DIAGNOSIS — R911 Solitary pulmonary nodule: Secondary | ICD-10-CM | POA: Diagnosis not present

## 2022-09-16 DIAGNOSIS — G4733 Obstructive sleep apnea (adult) (pediatric): Secondary | ICD-10-CM

## 2022-09-16 DIAGNOSIS — R918 Other nonspecific abnormal finding of lung field: Secondary | ICD-10-CM | POA: Diagnosis not present

## 2022-09-16 DIAGNOSIS — Z111 Encounter for screening for respiratory tuberculosis: Secondary | ICD-10-CM | POA: Diagnosis not present

## 2022-09-16 DIAGNOSIS — Z0001 Encounter for general adult medical examination with abnormal findings: Secondary | ICD-10-CM

## 2022-09-16 DIAGNOSIS — J4520 Mild intermittent asthma, uncomplicated: Secondary | ICD-10-CM

## 2022-09-16 DIAGNOSIS — N9489 Other specified conditions associated with female genital organs and menstrual cycle: Secondary | ICD-10-CM

## 2022-09-16 DIAGNOSIS — N761 Subacute and chronic vaginitis: Secondary | ICD-10-CM

## 2022-09-16 MED ORDER — CLOBETASOL PROPIONATE 0.05 % EX CREA
1.0000 | TOPICAL_CREAM | Freq: Two times a day (BID) | CUTANEOUS | 0 refills | Status: DC
Start: 2022-09-16 — End: 2023-03-18

## 2022-09-16 NOTE — Assessment & Plan Note (Signed)
Stable  controlled

## 2022-09-16 NOTE — Patient Instructions (Addendum)
  Check with [facility] about your shingles vaccination  Due for prevnar 20 which is pneumonia vaccine as well as tetanus   A referral was placed today for gynecology as well as for pulmonary  Please let us know if you have not heard back within 2 weeks about the referral.  Regards,   Charleton Deyoung FNP-C

## 2022-09-16 NOTE — Progress Notes (Signed)
Subjective:  Patient ID: Lisa Chambers, female    DOB: Nov 05, 1950  Age: 72 y.o. MRN: [id number]  Patient Care Team: Mort Sawyers, FNP as PCP - General (Family Medicine) Iran Ouch, MD as PCP - Cardiology (Cardiology)   CC:  Chief Complaint  Patient presents with   Medical Management of Chronic Issues    Needs TB test    HPI Lisa Chambers is a 72 y.o. female who presents today for an annual physical exam. She reports consuming a general diet.  walks  She generally feels well. She reports sleeping well. She does not have additional problems to discuss today.   Vision:Within last year Dental:Receives regular dental care STD:The patient denies history of sexually transmitted disease.   Mammogram: 02/24/22 Last pap: > 28 y/o  Colonoscopy:scheduled for September  Bone density scan: 05/25/2010  Pt is with acute concerns.  Still with ongoing vaginal itching and burning. Using estrace twice weekly and uses cream prn. Last visit did d/w her about decreasing the dose, was given rx for 0.025 mg weekly estradiol, will d/c progesterone once estradiol is completed. Urine culture negative for UTI 7/23. Often with vaginal dryness. Not currently having sex. Does not note any vaginal discharge.   Recent CT abd pelvis with new poorly defined sub centimeter nodule opacities in both lower lobes. Pt without cough sob and or chest tightness. Never been a smoker. Dad did smoke around her her entire life.   On cpap, was seeing dr. Meredeth Ide but woul dlike a practice closer to her. She is on CPAP uses nightly. DME company is [employer]. H/o intermittent asthma, no flares. Switched to wixella 260/50 one puff BID   Completed TB risk questionnaire as follows:  Were you born outside [place] in [place], [place], [place], [place] or [place]? No.  Have you traveled outside of [place] and lived for more than one month? No.   Do you have a compromised immune system such as from the following:  HIV/AIDs, organ or bone  marrow transplant, diabetes, immunosuppressive medications, leukemia, lymphoma, cancer of head or neck, gastrectomy, or jejunal bypass, end stage renal disease on dialysis or silicosis? No.  Have you ever done one of the following? Used crack cocaine, injected illegal drugs, worked or resided in jail or prison, worked or resided at a homeless shelter, or worked as a Research scientist (physical sciences) in Careers information officer with patients? No.  Have you ever been exposed to anyone with infectious tb? No.  Completed TB symptom questionnaire as follows:   Do you currently have any of the following symptoms?  Unexplained cough more than 3 weeks? No. Unexplained fever lasting more than 3 weeks? No. Night sweats, that are leaving bedclothes and sheets wet? No. Shortness of breath? No. Chest pain? No. Unintentional weight loss? No. Unexplained fatigued? No.   Advanced Directives Patient does have advanced directives including living will. She does not have a copy in the electronic medical record.   DEPRESSION SCREENING    09/16/2022    8:06 AM 07/20/2022   10:31 AM 07/08/2022   12:50 PM 01/19/2022   10:27 AM 09/29/2021   10:05 AM 06/26/2021    8:20 AM 03/14/2021    8:39 AM  PHQ 2/9 Scores  PHQ - 2 Score 0        PHQ- 9 Score 2           Information is confidential and restricted. Go to Review Flowsheets to unlock data.  ROS: Negative unless specifically indicated above in HPI.    Current Outpatient Medications:    albuterol (VENTOLIN HFA) 108 (90 Base) MCG/ACT inhaler, Inhale into the lungs., Disp: , Rfl:    buPROPion (WELLBUTRIN XL) 300 MG 24 hr tablet, Take 1 tablet (300 mg total) by mouth in the morning. TAKE 1 TABLET BY MOUTH EVERY 24 HOURS, Disp: 90 tablet, Rfl: 1   Cholecalciferol 25 MCG (1000 UT) capsule, Take by mouth., Disp: , Rfl:    clobetasol cream (TEMOVATE) 0.05 %, Apply 1 Application topically 2 (two) times daily., Disp: 30 g, Rfl: 0   cycloSPORINE (RESTASIS) 0.05  % ophthalmic emulsion, , Disp: , Rfl:    estradiol (VIVELLE-DOT) 0.025 MG/24HR, Place 1 patch onto the skin 2 (two) times a week., Disp: 8 patch, Rfl: 12   Fexofenadine HCl (ALLEGRA PO), Take by mouth as needed., Disp: , Rfl:    fluticasone (FLONASE) 50 MCG/ACT nasal spray, 2 sprays daily. , Disp: , Rfl:    Fluticasone-Salmeterol (ADVAIR HFA IN), Inhale into the lungs 2 (two) times daily., Disp: , Rfl:    hydrochlorothiazide (HYDRODIURIL) 25 MG tablet, Take 1 tablet by mouth daily., Disp: , Rfl:    L-Lysine 1000 MG TABS, Take 1 tablet by mouth daily., Disp: , Rfl:    montelukast (SINGULAIR) 10 MG tablet, Take 1 tablet by mouth daily., Disp: , Rfl:    Omega-3 Fatty Acids (FISH OIL) 1200 MG CAPS, Take by mouth daily in the afternoon., Disp: , Rfl:    omeprazole (PRILOSEC) 40 MG capsule, Take 40 mg by mouth daily., Disp: , Rfl:    Probiotic Product (PROBIOTIC PO), Take by mouth., Disp: , Rfl:    progesterone (PROMETRIUM) 100 MG capsule, Take by mouth., Disp: , Rfl:    venlafaxine XR (EFFEXOR-XR) 75 MG 24 hr capsule, Take 1 capsule (75 mg total) by mouth 2 (two) times daily with a meal., Disp: 180 capsule, Rfl: 1   zaleplon (SONATA) 10 MG capsule, TAKE 1 CAPSULE BY MOUTH AT BEDTIME AS NEEDED FOR SLEEP., Disp: 30 capsule, Rfl: 4    Objective:    BP 128/82   Pulse 93   Temp 97.7 F (36.5 C)   Ht 5\' 3"  (1.6 m)   Wt 216 lb 6.4 oz (98.2 kg)   SpO2 97%   BMI 38.33 kg/m   BP Readings from Last 3 Encounters:  09/16/22 128/82  09/01/22 138/82  08/07/22 120/80      Physical Exam Constitutional:      General: She is not in acute distress.    Appearance: Normal appearance. She is obese. She is not ill-appearing.  HENT:     Head: Normocephalic.     Right Ear: Tympanic membrane normal.     Left Ear: Tympanic membrane normal.     Nose: Nose normal.     Mouth/Throat:     Mouth: Mucous membranes are moist.  Eyes:     Extraocular Movements: Extraocular movements intact.     Pupils: Pupils  are equal, round, and reactive to light.  Cardiovascular:     Rate and Rhythm: Normal rate and regular rhythm.  Pulmonary:     Effort: Pulmonary effort is normal.     Breath sounds: Normal breath sounds.  Abdominal:     General: Abdomen is flat. Bowel sounds are normal.     Palpations: Abdomen is soft.     Tenderness: There is no guarding or rebound.  Genitourinary:    Labia:  Right: Rash present.        Left: Rash present.      Vagina: Erythema and tenderness (slight) present. No vaginal discharge.     Comments: lighter discoloration on bilateral outer labias with some swelling to both Musculoskeletal:        General: Normal range of motion.     Cervical back: Normal range of motion.  Skin:    General: Skin is warm.     Capillary Refill: Capillary refill takes less than 2 seconds.  Neurological:     General: No focal deficit present.     Mental Status: She is alert.  Psychiatric:        Mood and Affect: Mood normal.        Behavior: Behavior normal.        Thought Content: Thought content normal.        Judgment: Judgment normal.          Assessment & Plan:  Pulmonary nodule -     CT CHEST WO CONTRAST; Future  Opacity of lung on imaging study Assessment & Plan: New finding on CT 09/01/22, repeat ct w/o contrast x 3 months.  Pt asymptomatic.  TB testing in office today done for her work application. Pending results.    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  Stop estradiol cream start clobetasol cream Referral placed to gynecology for ongoing evaluation/treat  Handout given to pt with more information.  Wet prep ordered to r/o bv yeast although low suspicion.  Will continue to wean down on estrogen and progesterone, low suspicion for vaginal atrophy as with symptoms despite being on HRT. Continue patch x 4 weeks then d/c both patch and progesterone.   Orders: -     WET PREP BY MOLECULAR PROBE -     Ambulatory  referral to Gynecology -     Clobetasol Propionate; Apply 1 Application topically 2 (two) times daily.  Dispense: 30 g; Refill: 0  Labial swelling -     WET PREP BY MOLECULAR PROBE -     Ambulatory referral to Gynecology -     Clobetasol Propionate; Apply 1 Application topically 2 (two) times daily.  Dispense: 30 g; Refill: 0  Vaginal itching -     WET PREP BY MOLECULAR PROBE -     Ambulatory referral to Gynecology -     Clobetasol Propionate; Apply 1 Application topically 2 (two) times daily.  Dispense: 30 g; Refill: 0  Mild intermittent asthma without complication Assessment & Plan: Stable controlled  Orders: -     Ambulatory referral to Pulmonology  OSA (obstructive sleep apnea) Assessment & Plan: Referral placed for pulmonary, pt already seeing one however requesting closer location.  OSA stable, compliant on CPAP With intermittent asthma, also controlled on wixella   Orders: -     Ambulatory referral to Pulmonology  Mixed hyperlipidemia Assessment & Plan: Pt advised to:  Work on low cholesterol diet and exercise as tolerated    Encounter for general adult medical examination with abnormal findings Assessment & Plan: Patient Counseling(The following topics were reviewed):  Preventative care handout given to pt  Health maintenance and immunizations reviewed. Please refer to Health maintenance section. Pt advised on safe sex, wearing seatbelts in car, and proper nutrition labwork ordered today for annual Dental health: Discussed importance of regular tooth brushing, flossing, and dental visits.    Pt recommended to obtain shingrix, prevnar 20 and tetanus vaccine. [facility]  and possibly get them there due to having medicare. Can not get in office. Pelvic exam, going over reports such as CT and prior doctors notes took approximately 20 minutes.    Follow-up: Return in about 6 months (around 03/19/2023) for f/u cholesterol.   Mort Sawyers, FNP

## 2022-09-16 NOTE — Assessment & Plan Note (Signed)
Referral placed for pulmonary, pt already seeing one however requesting closer location.  OSA stable, compliant on CPAP With intermittent asthma, also controlled on wixella

## 2022-09-16 NOTE — Assessment & Plan Note (Signed)
Pt advised to:  Work on low cholesterol diet and exercise as tolerated

## 2022-09-16 NOTE — Progress Notes (Signed)
Tuberculin skin test applied to left ventral forearm. Advised to make an appointment on 09/18/22 to come back so the ppd could be read and documented in her chart.

## 2022-09-16 NOTE — Assessment & Plan Note (Signed)
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  Stop estradiol cream start clobetasol cream Referral placed to gynecology for ongoing evaluation/treat  Handout given to pt with more information.  Wet prep ordered to r/o bv yeast although low suspicion.  Will continue to wean down on estrogen and progesterone, low suspicion for vaginal atrophy as with symptoms despite being on HRT. Continue patch x 4 weeks then d/c both patch and progesterone.

## 2022-09-16 NOTE — Assessment & Plan Note (Signed)
Patient Counseling(The following topics were reviewed): ? Preventative care handout given to pt  ?Health maintenance and immunizations reviewed. Please refer to Health maintenance section. ?Pt advised on safe sex, wearing seatbelts in car, and proper nutrition ?labwork ordered today for annual ?Dental health: Discussed importance of regular tooth brushing, flossing, and dental visits. ? ? ?

## 2022-09-16 NOTE — Assessment & Plan Note (Signed)
New finding on CT 09/01/22, repeat ct w/o contrast x 3 months.  Pt asymptomatic.  TB testing in office today done for her work application. Pending results.

## 2022-09-18 ENCOUNTER — Ambulatory Visit (INDEPENDENT_AMBULATORY_CARE_PROVIDER_SITE_OTHER): Payer: Medicare PPO

## 2022-09-18 DIAGNOSIS — Z111 Encounter for screening for respiratory tuberculosis: Secondary | ICD-10-CM

## 2022-09-18 LAB — TB SKIN TEST
Induration: 0 mm
TB Skin Test: NEGATIVE

## 2022-09-18 NOTE — Progress Notes (Signed)
PPD Reading Note PPD read and results entered in EpicCare. Result: 0 mm induration. Interpretation: Negative  If test not read within 48-72 hours of initial placement, patient advised to repeat in other arm 1-3 weeks after this test. Allergic reaction: no    Patient has brought form she needs completed. Placed in your box for review. Would like call once done.

## 2022-09-21 ENCOUNTER — Encounter: Payer: Self-pay | Admitting: Family

## 2022-09-21 DIAGNOSIS — Z0184 Encounter for antibody response examination: Secondary | ICD-10-CM

## 2022-09-22 NOTE — Telephone Encounter (Signed)
Recent gynecology referral was closed.  Can we reopen and send to another gynecologist which may be taking patients?

## 2022-09-23 ENCOUNTER — Telehealth: Payer: Self-pay

## 2022-09-23 NOTE — Telephone Encounter (Signed)
-----   Message from [place] sent at 09/21/2022  2:51 PM EDT ----- Health exam form to be completed however I dont have updated records for tetanus, hep b or MMR. Can we print [place] registry so I can update?

## 2022-09-23 NOTE — Telephone Encounter (Signed)
Pt's paperwork ready for pick up. Copy made for chart.  Originals in an envelope for pt to pick up.  Left a message on pts voicemail.

## 2022-09-23 NOTE — Progress Notes (Signed)
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 This likely will not hold her up at her job application form but she does still need this.

## 2022-09-28 ENCOUNTER — Other Ambulatory Visit (INDEPENDENT_AMBULATORY_CARE_PROVIDER_SITE_OTHER): Payer: Medicare PPO

## 2022-09-28 DIAGNOSIS — Z0184 Encounter for antibody response examination: Secondary | ICD-10-CM

## 2022-09-29 LAB — HEPATITIS B SURFACE ANTIBODY, QUANTITATIVE: Hep B S AB Quant (Post): 47 m[IU]/mL (ref >=10)

## 2022-09-29 LAB — MEASLES/MUMPS/RUBELLA IMMUNITY
Mumps IgG: 59.9 [AU]/ml
Rubella: 4.91 {index}
Rubeola IgG: >300 [AU]/ml

## 2022-10-05 ENCOUNTER — Telehealth: Payer: Self-pay

## 2022-10-05 NOTE — Telephone Encounter (Signed)
Attempted to reach patient regarding message left with answering service Left voicemail for patient to call office or send mychart message with details regarding appointment needs.   

## 2022-10-06 ENCOUNTER — Encounter: Payer: Self-pay | Admitting: Family

## 2022-10-16 ENCOUNTER — Other Ambulatory Visit (HOSPITAL_COMMUNITY)
Admission: RE | Admit: 2022-10-16 | Discharge: 2022-10-16 | Disposition: A | Discharge status: Home / Self Care | Payer: Medicare PPO | Source: Ambulatory Visit | Attending: Family Medicine | Admitting: Family Medicine

## 2022-10-16 ENCOUNTER — Encounter: Payer: Self-pay | Admitting: Family Medicine

## 2022-10-16 ENCOUNTER — Other Ambulatory Visit: Payer: Self-pay | Admitting: Psychiatry

## 2022-10-16 ENCOUNTER — Ambulatory Visit: Payer: Medicare PPO | Admitting: Family Medicine

## 2022-10-16 VITALS — BP 128/81 | HR 67 | Wt 219.0 lb

## 2022-10-16 DIAGNOSIS — Z79890 Hormone replacement therapy (postmenopausal): Secondary | ICD-10-CM | POA: Diagnosis not present

## 2022-10-16 DIAGNOSIS — L292 Pruritus vulvae: Secondary | ICD-10-CM | POA: Diagnosis not present

## 2022-10-16 DIAGNOSIS — Z1339 Encounter for screening examination for other mental health and behavioral disorders: Secondary | ICD-10-CM

## 2022-10-16 DIAGNOSIS — F5101 Primary insomnia: Secondary | ICD-10-CM

## 2022-10-16 DIAGNOSIS — N761 Subacute and chronic vaginitis: Secondary | ICD-10-CM | POA: Diagnosis not present

## 2022-10-16 DIAGNOSIS — F3342 Major depressive disorder, recurrent, in full remission: Secondary | ICD-10-CM

## 2022-10-16 NOTE — Progress Notes (Signed)
   GYNECOLOGY PROBLEM  VISIT ENCOUNTER NOTE  Subjective:   Lisa Chambers is a 72 y.o. No obstetric history on file. female here for a problem GYN visit.  Current complaints:   #Vaginal/vulvar itching: present for several month, noted at night PCP started her on clobetasol and sx have improved. 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  #HRT: has been on since 38s. Reports improved energy and feels better on therapy. She is up to date on mammogram and paps are not indicated at her age. She reports PCP decreased estrogen dose to about 1/2 of previous. Patient felt the change in dose but it has stabilized somewhat. She is on estrogen patches and progesterone.   Denies abnormal vaginal bleeding, discharge, pelvic pain, problems with intercourse or other gynecologic concerns.    Gynecologic History No LMP recorded. Patient is postmenopausal.  Contraception: post menopausal status  Health Maintenance Due  Topic Date Due   Medicare Annual Wellness (AWV)  Never done   Hepatitis C Screening  Never done   Colonoscopy  Never done   COVID-19 Vaccine (3 - Pfizer risk series) 11/29/2019   Pneumonia Vaccine 65+ Years old (2 of 2 - PCV) 09/27/2020    The following portions of the patient's history were reviewed and updated as appropriate: allergies, current medications, past family history, past medical history, past social history, past surgical history and problem list.  Review of Systems Pertinent items are noted in HPI.   Objective:  BP 128/81   Pulse 67   Wt 219 lb (99.3 kg)   BMI 38.79 kg/m  Gen: well appearing, NAD HEENT: no scleral icterus CV: RR Lung: Normal WOB Ext: warm well perfused  PELVIC: Normal appearing external genitalia-thinning pubic hair. There are some red/white patches on the bilateral lower labia minora that appear to be healing.  Atrophic normal appearing vaginal mucosa and cervix.  No abnormal discharge noted.    Consented for punch biopsy  - Reviewed risk-  bleeding, infection, discoloration from silver nitrate, discomfort.   Time out performed  Right labia - Cleaned area with alcohol.  Injected 0.25 of lidocaine to area. Used 2mm punch to obtain two representative samples. Applied silver nitrate  Assessment and Plan:  1. Chronic vaginitis - Surgical pathology( [place]/ [facility])  2. Vulvar itching Most consistent with lichen sclerosis. Less likely vulvar cancer. Could also be normal atrophy.  - Surgical pathology( [place]/ [facility])  3. Hormone replacement therapy Discussed protective nature of progesterone Reviewed goal is minimal effective dose with gradual reduction and cessation by 75 Reviewed with patient recommend for staying up to date with mammograms and also that any vaginal bleeding should be seen quickly for assessment.   Please refer to After Visit Summary for other counseling recommendations.   Return if symptoms worsen or fail to improve.  Federico Flake, MD, MPH, ABFM Attending Physician Faculty Practice- [facility]

## 2022-10-16 NOTE — Progress Notes (Signed)
New Gyn here for problem visit today.  C/O: Vaginal itching pt states she has had itching for a few years off/on has not been evaluated for it. Did use estrogen Rx cream by Dr.Anderson had some relief. Was also given ointment for skin irritation to area.  Pt states no vaginal discharge, no yeast. No itching today.    NP has concerns about pt being progesterone and on estrogen patch dose was lowered.

## 2022-10-20 ENCOUNTER — Encounter
Admission: RE | Disposition: A | Discharge status: Home / Self Care | Payer: Self-pay | Source: Home / Self Care | Attending: Internal Medicine

## 2022-10-20 ENCOUNTER — Encounter: Payer: Self-pay | Admitting: Internal Medicine

## 2022-10-20 ENCOUNTER — Ambulatory Visit: Payer: Medicare PPO | Admitting: Certified Registered Nurse Anesthetist

## 2022-10-20 ENCOUNTER — Ambulatory Visit
Admission: RE | Admit: 2022-10-20 | Discharge: 2022-10-20 | Disposition: A | Discharge status: Home / Self Care | Payer: Medicare PPO | Attending: Internal Medicine | Admitting: Internal Medicine

## 2022-10-20 DIAGNOSIS — M1990 Unspecified osteoarthritis, unspecified site: Secondary | ICD-10-CM | POA: Insufficient documentation

## 2022-10-20 DIAGNOSIS — F32A Depression, unspecified: Secondary | ICD-10-CM | POA: Diagnosis not present

## 2022-10-20 DIAGNOSIS — G4733 Obstructive sleep apnea (adult) (pediatric): Secondary | ICD-10-CM | POA: Insufficient documentation

## 2022-10-20 DIAGNOSIS — K640 First degree hemorrhoids: Secondary | ICD-10-CM | POA: Diagnosis not present

## 2022-10-20 DIAGNOSIS — K5730 Diverticulosis of large intestine without perforation or abscess without bleeding: Secondary | ICD-10-CM | POA: Insufficient documentation

## 2022-10-20 DIAGNOSIS — E042 Nontoxic multinodular goiter: Secondary | ICD-10-CM | POA: Diagnosis not present

## 2022-10-20 DIAGNOSIS — J45909 Unspecified asthma, uncomplicated: Secondary | ICD-10-CM | POA: Diagnosis not present

## 2022-10-20 DIAGNOSIS — Z1211 Encounter for screening for malignant neoplasm of colon: Secondary | ICD-10-CM | POA: Insufficient documentation

## 2022-10-20 DIAGNOSIS — K219 Gastro-esophageal reflux disease without esophagitis: Secondary | ICD-10-CM | POA: Insufficient documentation

## 2022-10-20 DIAGNOSIS — G4730 Sleep apnea, unspecified: Secondary | ICD-10-CM | POA: Insufficient documentation

## 2022-10-20 DIAGNOSIS — Z86010 Personal history of colonic polyps: Secondary | ICD-10-CM | POA: Diagnosis not present

## 2022-10-20 DIAGNOSIS — I10 Essential (primary) hypertension: Secondary | ICD-10-CM | POA: Insufficient documentation

## 2022-10-20 DIAGNOSIS — D649 Anemia, unspecified: Secondary | ICD-10-CM | POA: Insufficient documentation

## 2022-10-20 DIAGNOSIS — D123 Benign neoplasm of transverse colon: Secondary | ICD-10-CM | POA: Diagnosis not present

## 2022-10-20 DIAGNOSIS — D120 Benign neoplasm of cecum: Secondary | ICD-10-CM | POA: Diagnosis not present

## 2022-10-20 DIAGNOSIS — G43909 Migraine, unspecified, not intractable, without status migrainosus: Secondary | ICD-10-CM | POA: Insufficient documentation

## 2022-10-20 HISTORY — PX: COLONOSCOPY WITH PROPOFOL: SHX5780

## 2022-10-20 HISTORY — PX: HEMOSTASIS CLIP PLACEMENT: SHX6857

## 2022-10-20 HISTORY — PX: POLYPECTOMY: SHX5525

## 2022-10-20 SURGERY — COLONOSCOPY WITH PROPOFOL
Anesthesia: General

## 2022-10-20 MED ORDER — PROPOFOL 10 MG/ML IV BOLUS
INTRAVENOUS | Status: DC | PRN
  Administered 2022-10-20: 60 mg via INTRAVENOUS

## 2022-10-20 MED ORDER — EPHEDRINE SULFATE (PRESSORS) 50 MG/ML IJ SOLN
INTRAMUSCULAR | Status: DC | PRN
  Administered 2022-10-20: 5 mg via INTRAVENOUS

## 2022-10-20 MED ORDER — PROPOFOL 500 MG/50ML IV EMUL
INTRAVENOUS | Status: DC | PRN
  Administered 2022-10-20: 160 ug/kg/min via INTRAVENOUS

## 2022-10-20 MED ORDER — EPHEDRINE 5 MG/ML INJ
INTRAVENOUS | Status: AC
  Filled 2022-10-20: qty 5

## 2022-10-20 MED ORDER — SODIUM CHLORIDE 0.9 % IV SOLN: INTRAVENOUS | Status: DC

## 2022-10-20 NOTE — Anesthesia Preprocedure Evaluation (Signed)
Anesthesia Evaluation  Patient identified by MRN, date of birth, ID band Patient awake    Reviewed: Allergy & Precautions, H&P , NPO status , Patient's Chart, lab work & pertinent test results, reviewed documented beta blocker date and time   History of Anesthesia Complications Negative for: history of anesthetic complications  Airway Mallampati: I  TM Distance: >3 FB Neck ROM: full    Dental  (+) Dental Advidsory Given, Implants, Teeth Intact   Pulmonary neg shortness of breath, asthma , sleep apnea and Continuous Positive Airway Pressure Ventilation , neg COPD, neg recent URI   Pulmonary exam normal breath sounds clear to auscultation       Cardiovascular Exercise Tolerance: Good hypertension, (-) angina (-) Past MI and (-) Cardiac Stents Normal cardiovascular exam(-) dysrhythmias (-) Valvular Problems/Murmurs Rhythm:regular Rate:Normal     Neuro/Psych  PSYCHIATRIC DISORDERS  Depression    negative neurological ROS     GI/Hepatic Neg liver ROS,GERD  ,,  Endo/Other  negative endocrine ROS    Renal/GU CRFRenal disease  negative genitourinary   Musculoskeletal   Abdominal   Peds  Hematology  (+) Blood dyscrasia, anemia   Anesthesia Other Findings Past Medical History: No date: Allergic rhinitis No date: Anemia No date: Asthma No date: Cancer [facility])     Comment:  skin No date: Chest pain     Comment:  a. 01/2020 MV: EF 70%, no isch/infarct. CT attenuation               correction images w/ minimal Ao athersclerosis, no               significant cor Ca2+. No date: Depression No date: GERD (gastroesophageal reflux disease) No date: Hearing loss No date: HTN (hypertension) No date: Hx of migraine headaches No date: Multinodular goiter No date: OSA on CPAP No date: Osteoarthritis No date: Thyroid disease   Reproductive/Obstetrics negative OB ROS                             Anesthesia  Physical Anesthesia Plan  ASA: 2  Anesthesia Plan: General   Post-op Pain Management:    Induction: Intravenous  PONV Risk Score and Plan: 3 and Propofol infusion and TIVA  Airway Management Planned: Natural Airway and Nasal Cannula  Additional Equipment:   Intra-op Plan:   Post-operative Plan:   Informed Consent: I have reviewed the patients History and Physical, chart, labs and discussed the procedure including the risks, benefits and alternatives for the proposed anesthesia with the patient or authorized representative who has indicated his/her understanding and acceptance.     Dental Advisory Given  Plan Discussed with: Anesthesiologist, CRNA and Surgeon  Anesthesia Plan Comments:        Anesthesia Quick Evaluation

## 2022-10-20 NOTE — Interval H&P Note (Signed)
History and Physical Interval Note:  10/20/2022 1:00 PM  Lisa Chambers  has presented today for surgery, with the diagnosis of V12.72 (ICD-9-CM) - Z86.010 (ICD-10-CM) - Hx of adenomatous colonic polyps.  The various methods of treatment have been discussed with the patient and family. After consideration of risks, benefits and other options for treatment, the patient has consented to  Procedure(s): COLONOSCOPY WITH PROPOFOL (N/A) as a surgical intervention.  The patient's history has been reviewed, patient examined, no change in status, stable for surgery.  I have reviewed the patient's chart and labs.  Questions were answered to the patient's satisfaction.     [place], [place]

## 2022-10-20 NOTE — Op Note (Signed)
[facility] Gastroenterology Patient Name: Lisa Chambers Procedure Date: 10/20/2022 12:58 PM MRN: [id number] Account #: [id number] Date of Birth: [birth year] Admit Type: Outpatient Age: 72 Room: [facility] ENDO ROOM 1 Gender: Female Note Status: Finalized Instrument Name: Prentice Docker [id number] Procedure:             Colonoscopy Indications:           Surveillance: Personal history of adenomatous polyps                         on last colonoscopy > 5 years ago Providers:             Royce Macadamia K. Norma Fredrickson MD, MD Referring MD:          Mort Sawyers (Referring MD) Medicines:             Propofol per Anesthesia Complications:         No immediate complications. Procedure:             Pre-Anesthesia Assessment:                        - The risks and benefits of the procedure and the                         sedation options and risks were discussed with the                         patient. All questions were answered and informed                         consent was obtained.                        - Patient identification and proposed procedure were                         verified prior to the procedure by the nurse. The                         procedure was verified in the procedure room.                        - ASA Grade Assessment: II - A patient with mild                         systemic disease.                        - After reviewing the risks and benefits, the patient                         was deemed in satisfactory condition to undergo the                         procedure.                        After obtaining informed consent, the colonoscope was                         passed  under direct vision. Throughout the procedure,                         the patient's blood pressure, pulse, and oxygen                         saturations were monitored continuously. The                         Colonoscope was introduced through the anus and                         advanced  to the the cecum, identified by appendiceal                         orifice and ileocecal valve. The colonoscopy was                         performed without difficulty. The patient tolerated                         the procedure well. The quality of the bowel                         preparation was adequate. The ileocecal valve,                         appendiceal orifice, and rectum were photographed. Findings:      The perianal and digital rectal examinations were normal. Pertinent       negatives include normal sphincter tone and no palpable rectal lesions.      Non-bleeding internal hemorrhoids were found during retroflexion. The       hemorrhoids were Grade I (internal hemorrhoids that do not prolapse).      Many small-mouthed diverticula were found in the left colon. There was       no evidence of diverticular bleeding.      A 5 mm polyp was found in the transverse colon. The polyp was sessile.       The polyp was removed with a piecemeal technique using a cold biopsy       forceps. Resection and retrieval were complete.      An 11 mm polyp was found in the cecum. The polyp was semi-pedunculated.       The polyp was removed with a cold snare. Resection and retrieval were       complete. To prevent bleeding after the polypectomy, one hemostatic clip       was successfully placed. Clip manufacturer: [employer]. There was       no bleeding at the end of the procedure.      The exam was otherwise without abnormality. Impression:            - Non-bleeding internal hemorrhoids.                        - Mild diverticulosis in the left colon. There was no                         evidence of diverticular bleeding.                        -  One 5 mm polyp in the transverse colon, removed                         piecemeal using a cold biopsy forceps. Resected and                         retrieved.                        - One 11 mm polyp in the cecum, removed with a cold                          snare. Resected and retrieved. Clip was placed. Clip                         manufacturer: [employer].                        - The examination was otherwise normal. Recommendation:        - Patient has a contact number available for                         emergencies. The signs and symptoms of potential                         delayed complications were discussed with the patient.                         Return to normal activities tomorrow. Written                         discharge instructions were provided to the patient.                        - Resume previous diet.                        - Continue present medications.                        - Repeat colonoscopy is recommended for surveillance.                         The colonoscopy date will be determined after                         pathology results from today's exam become available                         for review.                        - Return to GI office PRN.                        - The findings and recommendations were discussed with                         the patient. Procedure Code(s):     --- Professional ---  69629, Colonoscopy, flexible; with removal of                         tumor(s), polyp(s), or other lesion(s) by snare                         technique                        45380, 59, Colonoscopy, flexible; with biopsy, single                         or multiple Diagnosis Code(s):     --- Professional ---                        K57.30, Diverticulosis of large intestine without                         perforation or abscess without bleeding                        D12.0, Benign neoplasm of cecum                        D12.3, Benign neoplasm of transverse colon (hepatic                         flexure or splenic flexure)                        K64.0, First degree hemorrhoids                        Z86.010, Personal history of colonic polyps CPT copyright 2022  American Medical Association. All rights reserved. The codes documented in this report are preliminary and upon coder review may  be revised to meet current compliance requirements. Stanton Kidney MD, MD 10/20/2022 1:43:09 PM This report has been signed electronically. Number of Addenda: 0 Note Initiated On: 10/20/2022 12:58 PM Scope Withdrawal Time: 0 hours 8 minutes 22 seconds  Total Procedure Duration: 0 hours 19 minutes 35 seconds  Estimated Blood Loss:  Estimated blood loss was minimal.      [facility]

## 2022-10-20 NOTE — Anesthesia Procedure Notes (Signed)
Date/Time: 10/20/2022 1:15 PM  Performed by: Malva Cogan, CRNAPre-anesthesia Checklist: Patient identified, Emergency Drugs available, Suction available, Patient being monitored and Timeout performed Patient Re-evaluated:Patient Re-evaluated prior to induction Oxygen Delivery Method: Nasal cannula Induction Type: IV induction Placement Confirmation: CO2 detector and positive ETCO2

## 2022-10-20 NOTE — H&P (Signed)
Outpatient short stay form Pre-procedure 10/20/2022 12:57 PM Lisa Chambers, M.D.  Primary Physician: Mort Sawyers, FNP  Reason for visit:  Personal history of adenomatous colon polyps   History of present illness:                            Patient presents for colonoscopy for a personal hx of colon polyps. The patient denies abdominal pain, abnormal weight loss or rectal bleeding.     Current Facility-Administered Medications:    0.9 %  sodium chloride infusion, , Intravenous, Continuous, [place], Boykin Nearing, MD, Last Rate: 20 mL/hr at 10/20/22 1246, New Bag at 10/20/22 1246  Medications Prior to Admission  Medication Sig Dispense Refill Last Dose   buPROPion (WELLBUTRIN XL) 300 MG 24 hr tablet Take 1 tablet (300 mg total) by mouth in the morning. TAKE 1 TABLET BY MOUTH EVERY 24 HOURS 90 tablet 1 10/20/2022 at 0630   fluticasone (FLONASE) 50 MCG/ACT nasal spray 2 sprays daily.    10/20/2022 at 0630   Fluticasone-Salmeterol (ADVAIR HFA IN) Inhale into the lungs 2 (two) times daily.   10/20/2022 at 0630   hydrochlorothiazide (HYDRODIURIL) 25 MG tablet Take 1 tablet by mouth daily.   10/20/2022 at 0630   venlafaxine XR (EFFEXOR-XR) 75 MG 24 hr capsule Take 1 capsule (75 mg total) by mouth 2 (two) times daily with a meal. 180 capsule 1 10/20/2022 at 0630   albuterol (VENTOLIN HFA) 108 (90 Base) MCG/ACT inhaler Inhale into the lungs.      Cholecalciferol 25 MCG (1000 UT) capsule Take by mouth.      clobetasol cream (TEMOVATE) 0.05 % Apply 1 Application topically 2 (two) times daily. 30 g 0    cycloSPORINE (RESTASIS) 0.05 % ophthalmic emulsion     at 0630   estradiol (VIVELLE-DOT) 0.025 MG/24HR Place 1 patch onto the skin 2 (two) times a week. 8 patch 12    Fexofenadine HCl (ALLEGRA PO) Take by mouth as needed.      L-Lysine 1000 MG TABS Take 1 tablet by mouth daily.      meloxicam (MOBIC) 15 MG tablet Take 15 mg by mouth daily.      montelukast (SINGULAIR) 10 MG tablet Take 1 tablet by mouth  daily.      Omega-3 Fatty Acids (FISH OIL) 1200 MG CAPS Take by mouth daily in the afternoon.      omeprazole (PRILOSEC) 40 MG capsule Take 40 mg by mouth daily.      Probiotic Product (PROBIOTIC PO) Take by mouth.      progesterone (PROMETRIUM) 100 MG capsule Take by mouth.      zaleplon (SONATA) 10 MG capsule TAKE 1 CAPSULE BY MOUTH AT BEDTIME AS NEEDED FOR SLEEP. 30 capsule 4      Allergies  Allergen Reactions   Aspirin Hives and Swelling    Lips only.  Lips only.  Lips only.   Sulfa Antibiotics Hives   Erythromycin Other (See Comments)    GI upset  Other reaction(s): Other (See Comments)  GI upset  GI upset  GI upset  GI upset   Levofloxacin Other (See Comments)    Joint pain    Tetracycline Other (See Comments)    GI upset  Other reaction(s): Other (See Comments)  GI upset  GI upset  GI upset  GI upset     Past Medical History:  Diagnosis Date   Allergic rhinitis    Anemia  Asthma    Cancer (HCC)    skin   Chest pain    a. 01/2020 MV: EF 70%, no isch/infarct. CT attenuation correction images w/ minimal Ao athersclerosis, no significant cor Ca2+.   Depression    GERD (gastroesophageal reflux disease)    Hearing loss    HTN (hypertension)    Hx of migraine headaches    Multinodular goiter    OSA on CPAP    Osteoarthritis    Thyroid disease     Review of systems:  Otherwise negative.    Physical Exam  Gen: Alert, oriented. Appears stated age.  HEENT: [place]/AT. PERRLA. Lungs: CTA, no wheezes. CV: RR nl S1, S2. Abd: soft, benign, no masses. BS+ Ext: No edema. Pulses 2+    Planned procedures: Proceed with colonoscopy. The patient understands the nature of the planned procedure, indications, risks, alternatives and potential complications including but not limited to bleeding, infection, perforation, damage to internal organs and possible oversedation/side effects from anesthesia. The patient agrees and gives consent to proceed.  Please refer to  procedure notes for findings, recommendations and patient disposition/instructions.     Chaddrick Brue K. Norma Chambers, M.D. Gastroenterology 10/20/2022  12:57 PM

## 2022-10-20 NOTE — Transfer of Care (Signed)
Immediate Anesthesia Transfer of Care Note  Patient: Lisa Chambers  Procedure(s) Performed: COLONOSCOPY WITH PROPOFOL POLYPECTOMY HEMOSTASIS CLIP PLACEMENT  Patient Location: PACU  Anesthesia Type:General  Level of Consciousness: drowsy  Airway & Oxygen Therapy: Patient Spontanous Breathing  Post-op Assessment: Report given to RN and Post -op Vital signs reviewed and stable  Post vital signs: Reviewed and stable  Last Vitals:  Vitals Value Taken Time  BP    Temp    Pulse    Resp    SpO2      Last Pain:  Vitals:   10/20/22 1229  TempSrc: Temporal  PainSc: 0-No pain         Complications: No notable events documented.

## 2022-10-21 ENCOUNTER — Other Ambulatory Visit: Payer: Self-pay | Admitting: Family

## 2022-10-21 ENCOUNTER — Encounter: Payer: Self-pay | Admitting: Internal Medicine

## 2022-10-21 LAB — SURGICAL PATHOLOGY

## 2022-10-22 LAB — SURGICAL PATHOLOGY

## 2022-10-23 NOTE — Progress Notes (Signed)
noted 

## 2022-10-27 ENCOUNTER — Encounter: Payer: Self-pay | Admitting: Family

## 2022-10-28 ENCOUNTER — Telehealth: Payer: Self-pay | Admitting: *Deleted

## 2022-10-28 NOTE — Telephone Encounter (Signed)
Attempted to call pt, no answer and voicemail box is full.

## 2022-10-28 NOTE — Anesthesia Postprocedure Evaluation (Signed)
Anesthesia Post Note  Patient: Lisa Chambers  Procedure(s) Performed: COLONOSCOPY WITH PROPOFOL POLYPECTOMY HEMOSTASIS CLIP PLACEMENT  Patient location during evaluation: Endoscopy Anesthesia Type: General Level of consciousness: awake and alert Pain management: pain level controlled Vital Signs Assessment: post-procedure vital signs reviewed and stable Respiratory status: spontaneous breathing, nonlabored ventilation, respiratory function stable and patient connected to nasal cannula oxygen Cardiovascular status: blood pressure returned to baseline and stable Postop Assessment: no apparent nausea or vomiting Anesthetic complications: no   No notable events documented.   Last Vitals:  Vitals:   10/20/22 1352 10/20/22 1403  BP: 106/63 105/64  Pulse: 72 66  Resp: 13 19  Temp: (!) 36.3 C   SpO2: 95% 95%    Last Pain:  Vitals:   10/20/22 1403  TempSrc:   PainSc: 0-No pain                 Lenard Simmer

## 2022-10-30 ENCOUNTER — Encounter: Payer: Medicare PPO | Admitting: Family Medicine

## 2022-11-03 MED ORDER — OMEPRAZOLE 40 MG PO CPDR: 40.0000 mg | DELAYED_RELEASE_CAPSULE | Freq: Every day | ORAL | 2 refills | Status: DC

## 2022-11-16 ENCOUNTER — Telehealth: Payer: Self-pay

## 2022-11-16 ENCOUNTER — Encounter: Payer: Self-pay | Admitting: Pulmonary Disease

## 2022-11-16 ENCOUNTER — Ambulatory Visit: Payer: Medicare PPO | Admitting: Pulmonary Disease

## 2022-11-16 VITALS — BP 138/80 | HR 73 | Temp 97.0°F | Ht 65.0 in | Wt 218.0 lb

## 2022-11-16 DIAGNOSIS — G4733 Obstructive sleep apnea (adult) (pediatric): Secondary | ICD-10-CM | POA: Diagnosis not present

## 2022-11-16 NOTE — Telephone Encounter (Signed)
Called patient.  Patient had OV with Dr. Wynona Neat today.  Patient left paperwork from [facility] for a Polysomnogram from 2016 and a spirometry from 2023.  Patient requested these documents back as these are her only copies.  Patient left clinic before we could get copies back to her.  Left message on VM letting patient know we have her original papers ready for her to pick up at front desk.  We made copies to go to scan center to be scanned into EPIC for her chart.  Please let patient know papers are ready and how she wants to get this paperwork back.

## 2022-11-16 NOTE — Patient Instructions (Signed)
Your CT scan is scheduled for 12/17/2022  We will get a copy of a download from your machine  Continue using your CPAP nightly  Call us with significant concerns  Yearly follow-up is appropriate  Call us with any concerns once the CT scan is done and reported, I do believe it is likely inflammation/infection and the expectation is for her to have resolved.  May need further CT scans if the spots are persistent

## 2022-11-16 NOTE — Progress Notes (Unsigned)
Lisa Chambers    [id number]    [birth year]  Primary Care Physician:Dugal, Wyatt Mage, FNP  Referring Physician: Mort Sawyers, FNP [address] [place],  [place] [address]  Chief complaint:  ***  HPI:  ***  Pets: Occupation: Exposures: Smoking history: Travel history: Relevant family history:  Outpatient Encounter Medications as of 11/16/2022  Medication Sig  . albuterol (VENTOLIN HFA) 108 (90 Base) MCG/ACT inhaler Inhale into the lungs.  Marland Kitchen buPROPion (WELLBUTRIN XL) 300 MG 24 hr tablet Take 1 tablet (300 mg total) by mouth in the morning. TAKE 1 TABLET BY MOUTH EVERY 24 HOURS  . Cholecalciferol 25 MCG (1000 UT) capsule Take by mouth.  . clobetasol cream (TEMOVATE) 0.05 % Apply 1 Application topically 2 (two) times daily.  . cycloSPORINE (RESTASIS) 0.05 % ophthalmic emulsion   . estradiol (VIVELLE-DOT) 0.025 MG/24HR Place 1 patch onto the skin 2 (two) times a week.  Marland Kitchen Fexofenadine HCl (ALLEGRA PO) Take by mouth as needed.  . fluticasone (FLONASE) 50 MCG/ACT nasal spray 2 sprays daily.   . Fluticasone-Salmeterol (ADVAIR HFA IN) Inhale 1 puff into the lungs 2 (two) times daily.  . hydrochlorothiazide (HYDRODIURIL) 25 MG tablet Take 1 tablet by mouth daily.  Marland Kitchen L-Lysine 1000 MG TABS Take 1 tablet by mouth daily.  . montelukast (SINGULAIR) 10 MG tablet TAKE 1 TABLET BY MOUTH DAILY  . Omega-3 Fatty Acids (FISH OIL) 1200 MG CAPS Take by mouth daily in the afternoon.  Marland Kitchen omeprazole (PRILOSEC) 40 MG capsule Take 1 capsule (40 mg total) by mouth daily.  . Probiotic Product (PROBIOTIC PO) Take by mouth.  . progesterone (PROMETRIUM) 100 MG capsule Take by mouth.  . venlafaxine XR (EFFEXOR-XR) 75 MG 24 hr capsule Take 1 capsule (75 mg total) by mouth 2 (two) times daily with a meal.  . zaleplon (SONATA) 10 MG capsule TAKE 1 CAPSULE BY MOUTH AT BEDTIME AS NEEDED FOR SLEEP.  . [DISCONTINUED] meloxicam (MOBIC) 15 MG tablet Take 15 mg by mouth daily. (Patient not taking:  Reported on 11/16/2022)   No facility-administered encounter medications on file as of 11/16/2022.    Allergies as of 11/16/2022 - Review Complete 11/16/2022  Allergen Reaction Noted  . Aspirin Hives and Swelling 12/04/2010  . Sulfa antibiotics Hives 09/01/2022  . Erythromycin Other (See Comments) 10/18/2013  . Levofloxacin Other (See Comments) 10/18/2013  . Tetracycline Other (See Comments) 10/18/2013    Past Medical History:  Diagnosis Date  . Allergic rhinitis   . Anemia   . Asthma   . Cancer (HCC)    skin  . Chest pain    a. 01/2020 MV: EF 70%, no isch/infarct. CT attenuation correction images w/ minimal Ao athersclerosis, no significant cor Ca2+.  . Depression   . GERD (gastroesophageal reflux disease)   . Hearing loss   . HTN (hypertension)   . Hx of migraine headaches   . Multinodular goiter   . OSA on CPAP   . Osteoarthritis   . Thyroid disease     Past Surgical History:  Procedure Laterality Date  . BREAST BIOPSY Right 2021   fibroadenoma, benign  . CHOLECYSTECTOMY    . COLONOSCOPY WITH PROPOFOL N/A 10/20/2022   Procedure: COLONOSCOPY WITH PROPOFOL;  Surgeon: Toledo, Boykin Nearing, MD;  Location: [facility] ENDOSCOPY;  Service: Gastroenterology;  Laterality: N/A;  . HEMOSTASIS CLIP PLACEMENT  10/20/2022   Procedure: HEMOSTASIS CLIP PLACEMENT;  Surgeon: Toledo, Boykin Nearing, MD;  Location: [facility] ENDOSCOPY;  Service: Gastroenterology;;  . NASAL SINUS SURGERY  1996, 2007  . POLYPECTOMY  10/20/2022   Procedure: POLYPECTOMY;  Surgeon: Toledo, Boykin Nearing, MD;  Location: [facility] ENDOSCOPY;  Service: Gastroenterology;;  . REPLACEMENT TOTAL KNEE Bilateral 2017  . TUBAL LIGATION  1994    Family History  Problem Relation Age of Onset  . Breast cancer Mother   . Lung cancer Father   . Pneumonia Father   . Alcohol abuse Brother   . Diabetes Brother   . Heart attack Brother   . COPD Brother   . Insomnia Brother   . Colon cancer Maternal Grandmother     Social History    Socioeconomic History  . Marital status: Married    Spouse name: Mellody Dance  . Number of children: 2  . Years of education: Not on file  . Highest education level: Bachelor's degree (e.g., BA, AB, BS)  Occupational History    Comment: retired    Associate Professor: retired  Tobacco Use  . Smoking status: Never  . Smokeless tobacco: Never  Vaping Use  . Vaping status: Never Used  Substance and Sexual Activity  . Alcohol use: No  . Drug use: No  . Sexual activity: Not Currently  Other Topics Concern  . Not on file  Social History Narrative   Lives with husband   Caffeine- diet coke 2 daily   Social Determinants of Health   Financial Resource Strain: Not on file  Food Insecurity: Not on file  Transportation Needs: Not on file  Physical Activity: Not on file  Stress: Not on file  Social Connections: Not on file  Intimate Partner Violence: Not At Risk (10/15/2021)   [facility]   . Threatened: Not on file   . Insulted: Not on file   . Physically Hurt : Not on file   . Scream: Not on file    Review of Systems  Vitals:   11/16/22 1001  BP: 138/80  Pulse: 73  Temp: (!) 97 F (36.1 C)  SpO2: 99%     Physical Exam   Data Reviewed: Sleep study from 2020 January does reveal severe obstructive sleep apnea with AHI of 107.6, AHI on nasal CPAP at 10 was 0.0 events an hour -Performed at [facility] health  Uses [employer] -Called for compliance  CT scan of the abdomen May 2024 does reveal lower lobe nodules bilaterally  Compliance data reviewed showing excellent compliance 100% Average use of 8 hours 49 minutes AutoSet 5-20 Residual AHI 1.2  Assessment:  Severe obstructive sleep apnea -Compliant with CPAP use -Benefiting from CPAP use -Tolerating CPAP well  Lower lobe nodules -Follow-up CT ordered for November  Shortness of breath -Rarely uses rescue inhalers  Plan/Recommendations: Continue rescue inhaler use as  needed  Follow-up on CT scan of the chest  Follow-up on compliance -Continue to use CPAP on a nightly basis  Encouraged to give [place] a call with any significant concerns   Virl Diamond MD [facility] 11/16/2022, 10:26 AM  CC: Mort Sawyers, FNP

## 2022-11-20 ENCOUNTER — Telehealth: Payer: Self-pay | Admitting: Pulmonary Disease

## 2022-11-20 NOTE — Telephone Encounter (Signed)
New message    Patient calling back with information for Amy you can mail records or email to [email or web address]

## 2022-11-24 ENCOUNTER — Encounter: Payer: Medicare PPO | Admitting: Obstetrics and Gynecology

## 2022-11-25 NOTE — Telephone Encounter (Addendum)
11/16/2022:  Called patient.  Left message for patient to call back. Patient had OV with Dr. Wynona Neat 11/16/2022. Patient left paperwork from [facility] for a Polysomnogram from 2016 and a spirometry from 2023. Patient requested these documents back as these are her only copies. Patient left clinic before we could get copies back to her. Left message on VM letting patient know we have her original papers ready for her to pick up at front desk. We made copies to go to scan center to be scanned into EPIC for her chart.  Update on 11/25/2022:  Called patient. Left message for patient to call back.  Went to front desk to get papers to email to patient.  Papers not found at front desk.  Left message at [facility] to check on status of these papers being scanned into patients chart.  Documents not in patient chart as of today.  [facility].  They have the polysomnogram from 2016 and the spirometry from 2023.  [facility] is requesting a medical release be signed by patient before they will fax copy of these records to [place].  Patient can go by [facility] clinic and get the copies herself if needed.  Please fax a signed medical release form to:  [facility] at [phone]. Attn: medical records.

## 2022-11-25 NOTE — Telephone Encounter (Addendum)
11/20/2022 New message     Patient calling back with information for Nadra Hritz you can mail records or email to [email or web address]  11/25/2022  New message:  Left message for patient to call clinic.  See telephone contact for 11/20/2022 for updated information.  Will close this encounter.

## 2022-12-07 ENCOUNTER — Encounter: Payer: Self-pay | Admitting: Pulmonary Disease

## 2022-12-14 DIAGNOSIS — K219 Gastro-esophageal reflux disease without esophagitis: Secondary | ICD-10-CM | POA: Insufficient documentation

## 2022-12-16 ENCOUNTER — Ambulatory Visit
Admission: RE | Admit: 2022-12-16 | Discharge: 2022-12-16 | Disposition: A | Discharge status: Home / Self Care | Payer: Medicare PPO | Source: Ambulatory Visit | Attending: Family | Admitting: Family

## 2022-12-16 DIAGNOSIS — R911 Solitary pulmonary nodule: Secondary | ICD-10-CM | POA: Insufficient documentation

## 2022-12-29 ENCOUNTER — Other Ambulatory Visit: Payer: Self-pay | Admitting: Family

## 2022-12-29 ENCOUNTER — Encounter: Payer: Self-pay | Admitting: Pulmonary Disease

## 2022-12-29 DIAGNOSIS — J4489 Other specified chronic obstructive pulmonary disease: Secondary | ICD-10-CM | POA: Insufficient documentation

## 2022-12-29 NOTE — Progress Notes (Signed)
Have pt schedule f/u visit to discuss findings of CT chest.

## 2022-12-31 ENCOUNTER — Ambulatory Visit: Payer: Medicare PPO | Admitting: Family

## 2022-12-31 ENCOUNTER — Encounter: Payer: Self-pay | Admitting: Family

## 2022-12-31 VITALS — BP 132/84 | HR 74 | Temp 97.8°F | Ht 65.0 in | Wt 216.0 lb

## 2022-12-31 DIAGNOSIS — R350 Frequency of micturition: Secondary | ICD-10-CM | POA: Diagnosis not present

## 2022-12-31 DIAGNOSIS — N3000 Acute cystitis without hematuria: Secondary | ICD-10-CM | POA: Diagnosis not present

## 2022-12-31 LAB — POCT URINE DIPSTICK
Bilirubin, UA: NEGATIVE
Blood, UA: NEGATIVE
Glucose, UA: NEGATIVE mg/dL
Ketones, POC UA: NEGATIVE mg/dL
Nitrite, UA: NEGATIVE
POC PROTEIN,UA: NEGATIVE
Spec Grav, UA: 1.01 (ref 1.010–1.025)
Urobilinogen, UA: 0.2 U/dL
pH, UA: 7 (ref 5.0–8.0)

## 2022-12-31 MED ORDER — NITROFURANTOIN MONOHYD MACRO 100 MG PO CAPS
100.0000 mg | ORAL_CAPSULE | Freq: Two times a day (BID) | ORAL | 0 refills | Status: AC
Start: 2022-12-31 — End: 2023-01-05

## 2022-12-31 NOTE — Assessment & Plan Note (Signed)
poct urine dip in office Urine culture ordered pending results rx nitrofurantoin 100 mg po bix x 7 days antbx sent to pharmacy, pt to take as directed. Encouraged increased water intake throughout the day. Choosing to treat due to being symptomatic. If no improvement in the next 2 days pt advised to let me know.

## 2022-12-31 NOTE — Progress Notes (Signed)
Established Patient Office Visit  Subjective:   Patient ID: Lisa Chambers, female    DOB: 06-24-1950  Age: 72 y.o. MRN: [id number]  CC:  Chief Complaint  Patient presents with   Urinary Tract Infection    Reports increased frequency x5 days. Feels like her bladder is full but can't empty it.    HPI: Lisa Chambers is a 72 y.o. female presenting on 12/31/2022 for Urinary Tract Infection (Reports increased frequency x5 days. Feels like her bladder is full but can't empty it.)  About five days ago started with urinary frequency, urgency, and urinary retention. No fever or chills. No flank pain. With suprapubic pressure as well. Has been increasing water intake over the last few days. Not taking otc medications for this. No vaginal discharge. No dysuria.  Last metabolic panel Lab Results  Component Value Date   GLUCOSE 89 09/01/2022   NA 136 09/01/2022   K 4.1 09/01/2022   CL 99 09/01/2022   CO2 28 09/01/2022   BUN 13 09/01/2022   CREATININE 0.69 09/01/2022   GFR 88.38 09/01/2022   CALCIUM 9.6 09/01/2022   PROT 6.8 11/24/2011   ALBUMIN 3.4 11/24/2011   BILITOT 0.4 11/24/2011   ALKPHOS 95 11/24/2011   AST 24 11/24/2011   ALT 28 11/24/2011   ANIONGAP 11 07/15/2022        ROS: Negative unless specifically indicated above in HPI.   Relevant past medical history reviewed and updated as indicated.   Allergies and medications reviewed and updated.   Current Outpatient Medications:    albuterol (VENTOLIN HFA) 108 (90 Base) MCG/ACT inhaler, Inhale into the lungs., Disp: , Rfl:    buPROPion (WELLBUTRIN XL) 300 MG 24 hr tablet, Take 1 tablet (300 mg total) by mouth in the morning. TAKE 1 TABLET BY MOUTH EVERY 24 HOURS, Disp: 90 tablet, Rfl: 1   Cholecalciferol 25 MCG (1000 UT) capsule, Take by mouth., Disp: , Rfl:    clobetasol cream (TEMOVATE) 0.05 %, Apply 1 Application topically 2 (two) times daily., Disp: 30 g, Rfl: 0   cycloSPORINE (RESTASIS) 0.05 % ophthalmic emulsion,  , Disp: , Rfl:    estradiol (VIVELLE-DOT) 0.025 MG/24HR, Place 1 patch onto the skin 2 (two) times a week., Disp: 8 patch, Rfl: 12   Fexofenadine HCl (ALLEGRA PO), Take by mouth as needed., Disp: , Rfl:    fluticasone (FLONASE) 50 MCG/ACT nasal spray, 2 sprays daily. , Disp: , Rfl:    Fluticasone-Salmeterol (ADVAIR HFA IN), Inhale 1 puff into the lungs 2 (two) times daily., Disp: , Rfl:    hydrochlorothiazide (HYDRODIURIL) 25 MG tablet, Take 1 tablet by mouth daily., Disp: , Rfl:    L-Lysine 1000 MG TABS, Take 1 tablet by mouth daily., Disp: , Rfl:    montelukast (SINGULAIR) 10 MG tablet, TAKE 1 TABLET BY MOUTH DAILY, Disp: 90 tablet, Rfl: 1   nitrofurantoin, macrocrystal-monohydrate, (MACROBID) 100 MG capsule, Take 1 capsule (100 mg total) by mouth 2 (two) times daily for 5 days., Disp: 10 capsule, Rfl: 0   Omega-3 Fatty Acids (FISH OIL) 1200 MG CAPS, Take by mouth daily in the afternoon., Disp: , Rfl:    omeprazole (PRILOSEC) 40 MG capsule, Take 1 capsule (40 mg total) by mouth daily., Disp: 30 capsule, Rfl: 2   Probiotic Product (PROBIOTIC PO), Take by mouth., Disp: , Rfl:    progesterone (PROMETRIUM) 100 MG capsule, Take by mouth., Disp: , Rfl:    venlafaxine XR (EFFEXOR-XR) 75 MG 24 hr capsule,  Take 1 capsule (75 mg total) by mouth 2 (two) times daily with a meal., Disp: 180 capsule, Rfl: 1   zaleplon (SONATA) 10 MG capsule, TAKE 1 CAPSULE BY MOUTH AT BEDTIME AS NEEDED FOR SLEEP., Disp: 30 capsule, Rfl: 4  Allergies  Allergen Reactions   Aspirin Hives and Swelling    Lips only.  Lips only.  Lips only.   Sulfa Antibiotics Hives   Erythromycin Other (See Comments)    GI upset  Other reaction(s): Other (See Comments)  GI upset  GI upset  GI upset  GI upset   Levofloxacin Other (See Comments)    Joint pain    Tetracycline Other (See Comments)    GI upset  Other reaction(s): Other (See Comments)  GI upset  GI upset  GI upset  GI upset    Objective:   BP 132/84 (BP  Location: Left Arm, Patient Position: Sitting, Cuff Size: Normal)   Pulse 74   Temp 97.8 F (36.6 C) (Temporal)   Ht 5\' 5"  (1.651 m)   Wt 216 lb (98 kg)   SpO2 99%   BMI 35.94 kg/m    Physical Exam Constitutional:      General: She is not in acute distress.    Appearance: Normal appearance. She is normal weight. She is not ill-appearing, toxic-appearing or diaphoretic.  Cardiovascular:     Rate and Rhythm: Normal rate.  Pulmonary:     Effort: Pulmonary effort is normal.  Abdominal:     General: Abdomen is flat.     Tenderness: There is no abdominal tenderness. There is no right CVA tenderness or left CVA tenderness.  Neurological:     General: No focal deficit present.     Mental Status: She is alert and oriented to person, place, and time. Mental status is at baseline.     Motor: No weakness.  Psychiatric:        Mood and Affect: Mood normal.        Behavior: Behavior normal.        Thought Content: Thought content normal.        Judgment: Judgment normal.     Assessment & Plan:  Urinary frequency -     POCT URINE DIPSTICK -     Urine Culture  Acute cystitis without hematuria Assessment & Plan: poct urine dip in office Urine culture ordered pending results rx nitrofurantoin 100 mg po bix x 7 days antbx sent to pharmacy, pt to take as directed. Encouraged increased water intake throughout the day. Choosing to treat due to being symptomatic. If no improvement in the next 2 days pt advised to let me know.   Orders: -     Urine Culture -     Nitrofurantoin Monohyd Macro; Take 1 capsule (100 mg total) by mouth 2 (two) times daily for 5 days.  Dispense: 10 capsule; Refill: 0     Follow up plan: Return if symptoms worsen or fail to improve.  Mort Sawyers, FNP

## 2023-01-03 LAB — URINE CULTURE
MICRO NUMBER:: 15763023
SPECIMEN QUALITY:: ADEQUATE

## 2023-01-04 ENCOUNTER — Telehealth: Payer: Self-pay | Admitting: Family

## 2023-01-04 ENCOUNTER — Ambulatory Visit (INDEPENDENT_AMBULATORY_CARE_PROVIDER_SITE_OTHER): Payer: Medicare PPO

## 2023-01-04 ENCOUNTER — Ambulatory Visit: Payer: Medicare PPO | Admitting: Family

## 2023-01-04 ENCOUNTER — Encounter: Payer: Self-pay | Admitting: Family

## 2023-01-04 VITALS — Ht 65.0 in | Wt 215.0 lb

## 2023-01-04 VITALS — BP 126/82 | HR 81 | Temp 97.3°F | Ht 65.0 in | Wt 215.6 lb

## 2023-01-04 DIAGNOSIS — J4489 Other specified chronic obstructive pulmonary disease: Secondary | ICD-10-CM

## 2023-01-04 DIAGNOSIS — Z0000 Encounter for general adult medical examination without abnormal findings: Secondary | ICD-10-CM | POA: Diagnosis not present

## 2023-01-04 DIAGNOSIS — Z8619 Personal history of other infectious and parasitic diseases: Secondary | ICD-10-CM | POA: Insufficient documentation

## 2023-01-04 DIAGNOSIS — Z87440 Personal history of urinary (tract) infections: Secondary | ICD-10-CM

## 2023-01-04 DIAGNOSIS — M2550 Pain in unspecified joint: Secondary | ICD-10-CM | POA: Diagnosis not present

## 2023-01-04 DIAGNOSIS — R29898 Other symptoms and signs involving the musculoskeletal system: Secondary | ICD-10-CM | POA: Diagnosis not present

## 2023-01-04 LAB — CBC WITH DIFFERENTIAL/PLATELET
Basophils Absolute: 0.1 10*3/uL (ref 0.0–0.1)
Basophils Relative: 1.9 % (ref 0.0–3.0)
Eosinophils Absolute: 0.2 10*3/uL (ref 0.0–0.7)
Eosinophils Relative: 2.4 % (ref 0.0–5.0)
HCT: 42.8 % (ref 36.0–46.0)
Hemoglobin: 13.9 g/dL (ref 12.0–15.0)
Lymphocytes Relative: 21 % (ref 12.0–46.0)
Lymphs Abs: 1.5 10*3/uL (ref 0.7–4.0)
MCHC: 32.6 g/dL (ref 30.0–36.0)
MCV: 84.9 fL (ref 78.0–100.0)
Monocytes Absolute: 0.8 10*3/uL (ref 0.1–1.0)
Monocytes Relative: 11.6 % (ref 3.0–12.0)
Neutro Abs: 4.5 10*3/uL (ref 1.4–7.7)
Neutrophils Relative %: 63.1 % (ref 43.0–77.0)
Platelets: 311 10*3/uL (ref 150.0–400.0)
RBC: 5.04 Mil/uL (ref 3.87–5.11)
RDW: 13.8 % (ref 11.5–15.5)
WBC: 7.1 10*3/uL (ref 4.0–10.5)

## 2023-01-04 LAB — BASIC METABOLIC PANEL
BUN: 15 mg/dL (ref 6–23)
CO2: 28 meq/L (ref 19–32)
Calcium: 9.4 mg/dL (ref 8.4–10.5)
Chloride: 100 meq/L (ref 96–112)
Creatinine, Ser: 0.75 mg/dL (ref 0.40–1.20)
GFR: 80.88 mL/min (ref >60.00)
Glucose, Bld: 78 mg/dL (ref 70–99)
Potassium: 3.8 meq/L (ref 3.5–5.1)
Sodium: 137 meq/L (ref 135–145)

## 2023-01-04 LAB — C-REACTIVE PROTEIN: CRP: <1 mg/dL (ref 0.5–20.0)

## 2023-01-04 LAB — SEDIMENTATION RATE: Sed Rate: 23 mm/h (ref 0–30)

## 2023-01-04 NOTE — Assessment & Plan Note (Signed)
Due to this as well as bronchiolectasis will also test for RF pending results. Does have curvature of some joints with joint pain and stiffness.

## 2023-01-04 NOTE — Telephone Encounter (Signed)
Pt was seen today and sent for labs. Pt asked if urine test was ordered told her no it was not. She thought one was going to be ordered as a follow up from her appt last week. Pt said she is still having some symptoms. Gave the patient a urine cup to leave a sample before leaving and would send message back to provider to see if provider wanted to order follow up urine testing.

## 2023-01-04 NOTE — Patient Instructions (Addendum)
Lisa Chambers , Thank you for taking time to come for your Medicare Wellness Visit. I appreciate your ongoing commitment to your health goals. Please review the following plan we discussed and let me know if I can assist you in the future.   Referrals/Orders/Follow-Ups/Clinician Recommendations: none  This is a list of the screening recommended for you and due dates:  Health Maintenance  Topic Date Due   COVID-19 Vaccine (3 - Pfizer risk series) 01/20/2023*   Flu Shot  05/10/2023*   Pneumonia Vaccine (2 of 2 - PCV) 01/04/2024*   Hepatitis C Screening  01/04/2024*   Medicare Annual Wellness Visit  01/04/2024   Mammogram  02/25/2024   DTaP/Tdap/Td vaccine (3 - Td or Tdap) 07/15/2026   Colon Cancer Screening  10/19/2032   DEXA scan (bone density measurement)  Completed   Zoster (Shingles) Vaccine  Completed   HPV Vaccine  Aged Out  *Topic was postponed. The date shown is not the original due date.    Advanced directives: (Copy Requested) Please bring a copy of your health care power of attorney and living will to the office to be added to your chart at your convenience.  Next Medicare Annual Wellness Visit scheduled for next year: Yes 01/05/24 @ 2:20pm telephone

## 2023-01-04 NOTE — Assessment & Plan Note (Signed)
New finding on recent CT scan Handout given to pt and discussed in detail Ordering quantiferon, cbc and RF Pending results. Referral placed for pulmonary

## 2023-01-04 NOTE — Progress Notes (Signed)
Subjective:   Lisa Chambers is a 72 y.o. female who presents for Medicare Annual (Initial) preventive examination.  Visit Complete: Virtual I connected with  Kathlene November on 01/04/23 by a audio enabled telemedicine application and verified that I am speaking with the correct person using two identifiers.  Patient Location: Home  Provider Location: Home Office  I discussed the limitations of evaluation and management by telemedicine. The patient expressed understanding and agreed to proceed.  Vital Signs: Because this visit was a virtual/telehealth visit, some criteria may be missing or patient reported. Any vitals not documented were not able to be obtained and vitals that have been documented are patient reported.  Patient Medicare AWV questionnaire was completed by the patient on (not done); I have confirmed that all information answered by patient is correct and no changes since this date.  Cardiac Risk Factors include: advanced age (>52men, >63 women);hypertension;dyslipidemia;obesity (BMI >30kg/m2)    Objective:    Today's Vitals   01/04/23 1432  Weight: 215 lb (97.5 kg)  Height: 5\' 5"  (1.651 m)   Body mass index is 35.78 kg/m.     01/04/2023    2:45 PM 10/20/2022   12:30 PM 12/08/2019   12:35 PM 07/10/2019    2:02 PM  Advanced Directives  Does Patient Have a Medical Advance Directive? Yes Yes Yes No  Type of Estate agent of [place];Living will Healthcare Power of [place];Living will    Copy of Healthcare Power of Attorney in Chart? No - copy requested       Current Medications (verified) Outpatient Encounter Medications as of 01/04/2023  Medication Sig   albuterol (VENTOLIN HFA) 108 (90 Base) MCG/ACT inhaler Inhale into the lungs.   buPROPion (WELLBUTRIN XL) 300 MG 24 hr tablet Take 1 tablet (300 mg total) by mouth in the morning. TAKE 1 TABLET BY MOUTH EVERY 24 HOURS   Cholecalciferol 25 MCG (1000 UT) capsule Take by mouth.   clobetasol  cream (TEMOVATE) 0.05 % Apply 1 Application topically 2 (two) times daily.   cycloSPORINE (RESTASIS) 0.05 % ophthalmic emulsion    estradiol (VIVELLE-DOT) 0.025 MG/24HR Place 1 patch onto the skin 2 (two) times a week.   Fexofenadine HCl (ALLEGRA PO) Take by mouth as needed.   fluticasone (FLONASE) 50 MCG/ACT nasal spray 2 sprays daily.    Fluticasone-Salmeterol (ADVAIR HFA IN) Inhale 1 puff into the lungs 2 (two) times daily.   hydrochlorothiazide (HYDRODIURIL) 25 MG tablet Take 1 tablet by mouth daily.   L-Lysine 1000 MG TABS Take 1 tablet by mouth daily.   montelukast (SINGULAIR) 10 MG tablet TAKE 1 TABLET BY MOUTH DAILY   nitrofurantoin, macrocrystal-monohydrate, (MACROBID) 100 MG capsule Take 1 capsule (100 mg total) by mouth 2 (two) times daily for 5 days.   Omega-3 Fatty Acids (FISH OIL) 1200 MG CAPS Take by mouth daily in the afternoon.   omeprazole (PRILOSEC) 40 MG capsule Take 1 capsule (40 mg total) by mouth daily.   Probiotic Product (PROBIOTIC PO) Take by mouth.   progesterone (PROMETRIUM) 100 MG capsule Take by mouth.   venlafaxine XR (EFFEXOR-XR) 75 MG 24 hr capsule Take 1 capsule (75 mg total) by mouth 2 (two) times daily with a meal.   zaleplon (SONATA) 10 MG capsule TAKE 1 CAPSULE BY MOUTH AT BEDTIME AS NEEDED FOR SLEEP.   No facility-administered encounter medications on file as of 01/04/2023.    Allergies (verified) Aspirin, Sulfa antibiotics, Erythromycin, Levofloxacin, and Tetracycline   History: Past Medical  History:  Diagnosis Date   Allergic rhinitis    Anemia    Asthma    Cancer (HCC)    skin   Chest pain    a. 01/2020 MV: EF 70%, no isch/infarct. CT attenuation correction images w/ minimal Ao athersclerosis, no significant cor Ca2+.   Depression    GERD (gastroesophageal reflux disease)    Hearing loss    HTN (hypertension)    Hx of migraine headaches    Multinodular goiter    OSA on CPAP    Osteoarthritis    Thyroid disease    Past Surgical  History:  Procedure Laterality Date   BREAST BIOPSY Right 2021   fibroadenoma, benign   CHOLECYSTECTOMY     COLONOSCOPY WITH PROPOFOL N/A 10/20/2022   Procedure: COLONOSCOPY WITH PROPOFOL;  Surgeon: Toledo, Boykin Nearing, MD;  Location: [facility] ENDOSCOPY;  Service: Gastroenterology;  Laterality: N/A;   HEMOSTASIS CLIP PLACEMENT  10/20/2022   Procedure: HEMOSTASIS CLIP PLACEMENT;  Surgeon: Stanton Kidney, MD;  Location: [facility] ENDOSCOPY;  Service: Gastroenterology;;   NASAL SINUS SURGERY  1996, 2007   POLYPECTOMY  10/20/2022   Procedure: POLYPECTOMY;  Surgeon: Norma Fredrickson, Boykin Nearing, MD;  Location: [facility] ENDOSCOPY;  Service: Gastroenterology;;   REPLACEMENT TOTAL KNEE Bilateral 2017   TUBAL LIGATION  1994   Family History  Problem Relation Age of Onset   Breast cancer Mother    Lung cancer Father    Pneumonia Father    Alcohol abuse Brother    Diabetes Brother    Heart attack Brother    COPD Brother    Insomnia Brother    Colon cancer Maternal Grandmother    Social History   Socioeconomic History   Marital status: Married    Spouse name: Mellody Dance   Number of children: 2   Years of education: Not on file   Highest education level: Bachelor's degree (e.g., BA, AB, BS)  Occupational History    Comment: retired    Associate Professor: retired  Tobacco Use   Smoking status: Never   Smokeless tobacco: Never  Vaping Use   Vaping status: Never Used  Substance and Sexual Activity   Alcohol use: No   Drug use: No   Sexual activity: Not Currently  Other Topics Concern   Not on file  Social History Narrative   Lives with husband   Caffeine- diet coke 2 daily   Social Determinants of Health   Financial Resource Strain: Low Risk  (01/04/2023)   Overall Financial Resource Strain (CARDIA)    Difficulty of Paying Living Expenses: Not hard at all  Food Insecurity: No Food Insecurity (01/04/2023)   Hunger Vital Sign    Worried About Running Out of Food in the Last Year: Never true    Ran Out of Food in  the Last Year: Never true  Transportation Needs: No Transportation Needs (01/04/2023)   PRAPARE - Administrator, Civil Service (Medical): No    Lack of Transportation (Non-Medical): No  Physical Activity: Insufficiently Active (01/04/2023)   Exercise Vital Sign    Days of Exercise per Week: 3 days    Minutes of Exercise per Session: 30 min  Stress: No Stress Concern Present (01/04/2023)   [employer] of Occupational Health - Occupational Stress Questionnaire    Feeling of Stress : Not at all  Social Connections: Socially Integrated (01/04/2023)   Social Connection and Isolation Panel [NHANES]    Frequency of Communication with Friends and Family: Three times a week  Frequency of Social Gatherings with Friends and Family: Once a week    Attends Religious Services: More than 4 times per year    Active Member of [employer] or Organizations: Yes    Attends Banker Meetings: 1 to 4 times per year    Marital Status: Married    Tobacco Counseling Counseling given: Not Answered   Clinical Intake:  Pre-visit preparation completed: Yes  Pain : No/denies pain   BMI - recorded: 35.78 Nutritional Status: BMI > 30  Obese Nutritional Risks: None Diabetes: No  How often do you need to have someone help you when you read instructions, pamphlets, or other written materials from your doctor or pharmacy?: 1 - Never  Interpreter Needed?: No  Comments: lives with husband Information entered by :: B.Khoen Genet,LPN  Activities of Daily Living    01/04/2023    2:51 PM  In your present state of health, do you have any difficulty performing the following activities:  Hearing? 0  Vision? 0  Difficulty concentrating or making decisions? 0  Walking or climbing stairs? 0  Dressing or bathing? 0  Doing errands, shopping? 0  Preparing Food and eating ? N  Using the Toilet? N  In the past six months, have you accidently leaked urine? N  Do you have problems with loss  of bowel control? N  Managing your Medications? N  Managing your Finances? N  Housekeeping or managing your Housekeeping? N    Patient Care Team: Mort Sawyers, FNP as PCP - General (Family Medicine) Iran Ouch, MD as PCP - Cardiology (Cardiology) Dingeldein, Viviann Spare, MD (Ophthalmology)  Indicate any recent Medical Services you may have received from other than [facility] providers in the past year (date may be approximate).     Assessment:   This is a routine wellness examination for Lisa Chambers.  Hearing/Vision screen Hearing Screening - Comments:: Pt says hearing is good with hearing aids Vision Screening - Comments:: Pt says her vision is good with glasses Dr Dellie Burns   Goals Addressed               This Visit's Progress     Weight (lb) < 200 lb (90.7 kg) (pt-stated)        Pt says she would like to lose 20lb by next year by being more active, weight watcher app tracing what she eats, not eating after 6pm, drinking 5 bottles daily.       Depression Screen    01/04/2023    2:39 PM 10/16/2022    9:29 AM 09/16/2022    8:06 AM 07/20/2022   10:31 AM 07/08/2022   12:50 PM 01/19/2022   10:27 AM 09/29/2021   10:05 AM  PHQ 2/9 Scores  PHQ - 2 Score 0 0 0      PHQ- 9 Score  0 2         Information is confidential and restricted. Go to Review Flowsheets to unlock data.    Fall Risk    01/04/2023    2:34 PM 09/16/2022    8:06 AM 09/01/2022   10:28 AM  Fall Risk   Falls in the past year? 0 0 0  Number falls in past yr: 0 0 0  Injury with Fall? 0 0 0  Risk for fall due to : No Fall Risks No Fall Risks No Fall Risks  Follow up Education provided;Falls prevention discussed Falls evaluation completed Falls evaluation completed    MEDICARE RISK AT HOME: Medicare Risk at Home Any  stairs in or around the home?: Yes If so, are there any without handrails?: Yes Home free of loose throw rugs in walkways, pet beds, electrical cords, etc?: Yes Adequate lighting in your home to  reduce risk of falls?: Yes Life alert?: No Use of a cane, walker or w/c?: No Grab bars in the bathroom?: No Shower chair or bench in shower?: No Elevated toilet seat or a handicapped toilet?: Yes  TIMED UP AND GO:  Was the test performed?  No    Cognitive Function:    05/22/2020    9:45 AM  MMSE - Mini Mental State Exam  Orientation to time 5  Orientation to Place 5  Registration 3  Attention/ Calculation 4  Recall 3  Language- name 2 objects 2  Language- repeat 1  Language- follow 3 step command 3  Language- read & follow direction 1  Write a sentence 1  Copy design 1  Total score 29        01/04/2023    2:52 PM  6CIT Screen  What Year? 0 points  What month? 0 points  What time? 0 points  Count back from 20 0 points  Months in reverse 0 points  Repeat phrase 0 points  Total Score 0 points    Immunizations Immunization History  Administered Date(s) Administered   DTaP 02/09/2010   Hep B, Unspecified 11/10/2002   Influenza Inj Mdck Quad Pf 12/17/2015, 01/14/2021, 03/04/2022   Influenza,inj,Quad PF,6+ Mos 12/21/2014   Influenza,inj,quad, With Preservative 11/26/2016   PFIZER Comirnaty(Gray Top)Covid-19 Tri-Sucrose Vaccine 10/11/2019, 11/01/2019   PPD Test 01/09/2014, 12/25/2019, 04/14/2021, 09/16/2022   Pneumococcal Polysaccharide-23 09/28/2019   Pneumococcal-Unspecified 11/09/2004, 11/26/2016   Tdap 07/14/2016   Zoster Recombinant(Shingrix) 10/26/2018, 12/25/2018    TDAP status: Up to date  Flu Vaccine status: Up to date August "24  Pneumococcal vaccine status: Up to date  Covid-19 vaccine status: Completed vaccines  Qualifies for Shingles Vaccine? Yes   Zostavax completed Yes   Shingrix Completed?: Yes  Screening Tests Health Maintenance  Topic Date Due   COVID-19 Vaccine (3 - Pfizer risk series) 01/20/2023 (Originally 11/29/2019)   INFLUENZA VACCINE  05/10/2023 (Originally 09/10/2022)   Pneumonia Vaccine 35+ Years old (2 of 2 - PCV) 01/04/2024  (Originally 09/27/2020)   Hepatitis C Screening  01/04/2024 (Originally 12/22/1968)   Medicare Annual Wellness (AWV)  01/04/2024   MAMMOGRAM  02/25/2024   DTaP/Tdap/Td (3 - Td or Tdap) 07/15/2026   Colonoscopy  10/19/2032   DEXA SCAN  Completed   Zoster Vaccines- Shingrix  Completed   HPV VACCINES  Aged Out    Health Maintenance  There are no preventive care reminders to display for this patient.   Colorectal cancer screening: Type of screening: Colonoscopy. Completed 10/20/2022. Repeat every 10 years  Mammogram status: Completed 02/24/2022. Repeat every year  Bone Density status: Completed 05/15/2010. Results reflect: Bone density results: NORMAL. Repeat every 5 years.  Lung Cancer Screening: (Low Dose CT Chest recommended if Age 63-80 years, 20 pack-year currently smoking OR have quit w/in 15years.) does not qualify.   Lung Cancer Screening Referral: no  Additional Screening:  Hepatitis C Screening: does not qualify; Completed no  Vision Screening: Recommended annual ophthalmology exams for early detection of glaucoma and other disorders of the eye. Is the patient up to date with their annual eye exam?  Yes  Who is the provider or what is the name of the office in which the patient attends annual eye exams? Dr Dellie Burns If pt is  not established with a provider, would they like to be referred to a provider to establish care? No .   Dental Screening: Recommended annual dental exams for proper oral hygiene  Diabetic Foot Exam: n/a  Community Resource Referral / Chronic Care Management: CRR required this visit?  No   CCM required this visit?  No     Plan:     I have personally reviewed and noted the following in the patient's chart:   Medical and social history Use of alcohol, tobacco or illicit drugs  Current medications and supplements including opioid prescriptions. Patient is not currently taking opioid prescriptions. Functional ability and status Nutritional  status Physical activity Advanced directives List of other physicians Hospitalizations, surgeries, and ER visits in previous 12 months Vitals Screenings to include cognitive, depression, and falls Referrals and appointments  In addition, I have reviewed and discussed with patient certain preventive protocols, quality metrics, and best practice recommendations. A written personalized care plan for preventive services as well as general preventive health recommendations were provided to patient.   Sue Lush, LPN   16/11/9602   After Visit Summary: (MyChart) Due to this being a telephonic visit, the after visit summary with patients personalized plan was offered to patient via MyChart   Nurse Notes: The patient states she is doing well and has no concerns or questions at this time.

## 2023-01-04 NOTE — Progress Notes (Signed)
Established Patient Office Visit  Subjective:      CC:  Chief Complaint  Patient presents with   Medical Management of Chronic Issues    Review CT results.    HPI: Lisa Chambers is a 72 y.o. female presenting on 01/04/2023 for Medical Management of Chronic Issues (Review CT results.) . CT chest w/o contrast: pulmonary nodule right lung base with resolution from Ct study 09/01/22. Chronic mild patchy tree in bud type opacities left lower lob and lul with bronchiolectasis left lower lobe with mild increase from 12/08/19 CT (suggestive of mild chronic bronchiolitis), incidentally seen hypodense 2.5 cm anterior right thyroid nodule, stable compared to thyroid u/s 09/08/22 which had stated no further workup with FNA or imaging surveillance needed. She does have recurrent bronchitic infections in the past, but not really recent, and more so sinusitis recurrent (sees ENT)  Does see pulmonary however only for sleep apnea.   Bronchiolectasis: often with cough where she needs to clear her throat. Denies SOB or DOE. On occasion will cough up a brown tinged mucous but only every few months.   UTI: on last day of macrobid, still with occasional with urinary urgency however other symptoms have improved.   Right hand, will be trying to carry items and will drop the items without knowing why or having control over this symptom. Has seen neurology in the past for this with MRI, tested thoroughly for MS. Does have joint pains in multiple fingers as well, as well as bil knee pain however improved with knee replacement.  Chronic allergies: takes allergra daily, flonase as well as Singulair.   Completed TB risk questionnaire as follows:  Were you born outside [place] in [place], [place], [place], [place] or [place]? No.  Have you traveled outside of [place] and lived for more than one month? No.   Do you have a compromised immune system such as from the following: HIV/AIDs, organ or bone   marrow transplant, diabetes, immunosuppressive medications, leukemia, lymphoma, cancer of head or neck, gastrectomy, or jejunal bypass, end stage renal disease on dialysis or silicosis? No.  Have you ever done one of the following? Used crack cocaine, injected illegal drugs, worked or resided in jail or prison, worked or resided at a homeless shelter, or worked as a Research scientist (physical sciences) in Careers information officer with patients? No.  Have you ever been exposed to anyone with infectious tb? No.  Completed TB symptom questionnaire as follows:   Do you currently have any of the following symptoms?  Unexplained cough more than 3 weeks? No. Unexplained fever lasting more than 3 weeks? No. Night sweats, that are leaving bedclothes and sheets wet? No. Shortness of breath? No. Chest pain? No. Unintentional weight loss? No. Unexplained fatigued? No.  Social history:  Relevant past medical, surgical, family and social history reviewed and updated as indicated. Interim medical history since our last visit reviewed.  Allergies and medications reviewed and updated.  DATA REVIEWED: CHART IN EPIC     ROS: Negative unless specifically indicated above in HPI.    Current Outpatient Medications:    albuterol (VENTOLIN HFA) 108 (90 Base) MCG/ACT inhaler, Inhale into the lungs., Disp: , Rfl:    buPROPion (WELLBUTRIN XL) 300 MG 24 hr tablet, Take 1 tablet (300 mg total) by mouth in the morning. TAKE 1 TABLET BY MOUTH EVERY 24 HOURS, Disp: 90 tablet, Rfl: 1   Cholecalciferol 25 MCG (1000 UT) capsule, Take by mouth., Disp: , Rfl:    clobetasol  cream (TEMOVATE) 0.05 %, Apply 1 Application topically 2 (two) times daily., Disp: 30 g, Rfl: 0   cycloSPORINE (RESTASIS) 0.05 % ophthalmic emulsion, , Disp: , Rfl:    estradiol (VIVELLE-DOT) 0.025 MG/24HR, Place 1 patch onto the skin 2 (two) times a week., Disp: 8 patch, Rfl: 12   Fexofenadine HCl (ALLEGRA PO), Take by mouth as needed., Disp: , Rfl:    fluticasone  (FLONASE) 50 MCG/ACT nasal spray, 2 sprays daily. , Disp: , Rfl:    Fluticasone-Salmeterol (ADVAIR HFA IN), Inhale 1 puff into the lungs 2 (two) times daily., Disp: , Rfl:    hydrochlorothiazide (HYDRODIURIL) 25 MG tablet, Take 1 tablet by mouth daily., Disp: , Rfl:    L-Lysine 1000 MG TABS, Take 1 tablet by mouth daily., Disp: , Rfl:    montelukast (SINGULAIR) 10 MG tablet, TAKE 1 TABLET BY MOUTH DAILY, Disp: 90 tablet, Rfl: 1   nitrofurantoin, macrocrystal-monohydrate, (MACROBID) 100 MG capsule, Take 1 capsule (100 mg total) by mouth 2 (two) times daily for 5 days., Disp: 10 capsule, Rfl: 0   Omega-3 Fatty Acids (FISH OIL) 1200 MG CAPS, Take by mouth daily in the afternoon., Disp: , Rfl:    omeprazole (PRILOSEC) 40 MG capsule, Take 1 capsule (40 mg total) by mouth daily., Disp: 30 capsule, Rfl: 2   Probiotic Product (PROBIOTIC PO), Take by mouth., Disp: , Rfl:    progesterone (PROMETRIUM) 100 MG capsule, Take by mouth., Disp: , Rfl:    venlafaxine XR (EFFEXOR-XR) 75 MG 24 hr capsule, Take 1 capsule (75 mg total) by mouth 2 (two) times daily with a meal., Disp: 180 capsule, Rfl: 1   zaleplon (SONATA) 10 MG capsule, TAKE 1 CAPSULE BY MOUTH AT BEDTIME AS NEEDED FOR SLEEP., Disp: 30 capsule, Rfl: 4      Objective:    BP 126/82 (BP Location: Left Arm, Patient Position: Sitting, Cuff Size: Normal)   Pulse 81   Temp (!) 97.3 F (36.3 C) (Temporal)   Ht 5\' 5"  (1.651 m)   Wt 215 lb 9.6 oz (97.8 kg)   SpO2 95%   BMI 35.88 kg/m   Wt Readings from Last 3 Encounters:  01/04/23 215 lb 9.6 oz (97.8 kg)  12/31/22 216 lb (98 kg)  11/16/22 218 lb (98.9 kg)    Physical Exam Constitutional:      General: She is not in acute distress.    Appearance: Normal appearance. She is normal weight. She is not ill-appearing, toxic-appearing or diaphoretic.  HENT:     Head: Normocephalic.  Cardiovascular:     Rate and Rhythm: Normal rate and regular rhythm.  Pulmonary:     Effort: Pulmonary effort is  normal.     Breath sounds: Normal breath sounds. No stridor, decreased air movement or transmitted upper airway sounds.  Musculoskeletal:        General: Normal range of motion.  Neurological:     General: No focal deficit present.     Mental Status: She is alert and oriented to person, place, and time. Mental status is at baseline.  Psychiatric:        Mood and Affect: Mood normal.        Behavior: Behavior normal.        Thought Content: Thought content normal.        Judgment: Judgment normal.           Assessment & Plan:  Chronic bronchiolitis [facility]) Assessment & Plan: New finding on recent CT scan Handout given  to pt and discussed in detail Ordering quantiferon, cbc and RF Pending results. Referral placed for pulmonary   Orders: -     QuantiFERON-TB Gold Plus -     CBC with Differential/Platelet -     Ambulatory referral to Pulmonology  Frequent infections  History of UTI -     Basic metabolic panel  Decreased grip strength of right hand Assessment & Plan: Due to this as well as bronchiolectasis will also test for RF pending results. Does have curvature of some joints with joint pain and stiffness.   Orders: -     Rheumatoid factor  Polyarthralgia -     Rheumatoid factor -     ANA w/Reflex -     Sedimentation rate -     C-reactive protein     Return in about 6 months (around 07/04/2023).  Mort Sawyers, MSN, APRN, FNP-C [facility]

## 2023-01-05 ENCOUNTER — Encounter: Payer: Self-pay | Admitting: Family

## 2023-01-05 ENCOUNTER — Telehealth: Payer: Self-pay | Admitting: Pulmonary Disease

## 2023-01-05 LAB — ANA W/REFLEX: Anti Nuclear Antibody (ANA): NEGATIVE

## 2023-01-05 LAB — RHEUMATOID FACTOR: Rheumatoid fact SerPl-aCnc: <10 [IU]/mL (ref <14)

## 2023-01-05 NOTE — Telephone Encounter (Signed)
Updated about CT findings

## 2023-01-06 LAB — QUANTIFERON-TB GOLD PLUS
Mitogen-NIL: >10 [IU]/mL
NIL: 0.04 [IU]/mL
QuantiFERON-TB Gold Plus: NEGATIVE
TB1-NIL: 0 [IU]/mL
TB2-NIL: 0 [IU]/mL

## 2023-01-20 ENCOUNTER — Ambulatory Visit (INDEPENDENT_AMBULATORY_CARE_PROVIDER_SITE_OTHER): Payer: Medicare PPO | Admitting: Psychiatry

## 2023-01-20 ENCOUNTER — Encounter: Payer: Self-pay | Admitting: Psychiatry

## 2023-01-20 VITALS — BP 131/82 | HR 76 | Temp 95.9°F | Ht 65.0 in | Wt 215.0 lb

## 2023-01-20 DIAGNOSIS — F5101 Primary insomnia: Secondary | ICD-10-CM | POA: Diagnosis not present

## 2023-01-20 DIAGNOSIS — F3342 Major depressive disorder, recurrent, in full remission: Secondary | ICD-10-CM | POA: Diagnosis not present

## 2023-01-20 MED ORDER — BUPROPION HCL ER (XL) 300 MG PO TB24
300.0000 mg | ORAL_TABLET | Freq: Every morning | ORAL | 3 refills | Status: DC
Start: 2023-01-20 — End: 2023-07-21

## 2023-01-20 MED ORDER — VENLAFAXINE HCL ER 75 MG PO CP24: 75.0000 mg | ORAL_CAPSULE | Freq: Two times a day (BID) | ORAL | 3 refills | Status: DC

## 2023-01-20 MED ORDER — HYDROXYZINE HCL 10 MG PO TABS
10.0000 mg | ORAL_TABLET | Freq: Every evening | ORAL | 1 refills | Status: DC | PRN
Start: 2023-01-20 — End: 2023-09-08

## 2023-01-20 NOTE — Progress Notes (Signed)
BH MD OP Progress Note  01/20/2023 12:31 PM Lisa Chambers  MRN:  [id number]  Chief Complaint:  Chief Complaint  Patient presents with   Follow-up   Anxiety   Depression   Medication Refill   HPI: Lisa Chambers is a 72 year old Caucasian female, married, retired, lives in [place], has a history of MDD, primary insomnia, obstructive sleep apnea on CPAP, asthma, hypertension, history of thyroid nodule, GERD was evaluated in office today.  The patient presented for a follow-up visit after a previous appointment in June. The patient reported a generally stable mood with no significant depressive symptoms. However, she noted increased stress related to the holiday season and personal expectations.  The patient expressed concern about impulsive shopping behaviors, particularly around holiday decorations, which have been a longstanding issue but seem to have increased recently. She reported spending unplanned amounts on decorations, leading to mild conflict with her spouse. The patient acknowledged that these behaviors might be a coping mechanism for stress and distraction from other life issues.  The patient also reported increased frustration with her spouse, who has Parkinson's disease and exhibits signs of dementia. The patient described feeling like she is being followed around by a "[age]-year-old," with her spouse frequently asking "why" questions, which the patient finds irritating. The patient expressed a need for more personal space and time away from her spouse.  The patient reported a recent diagnosis of a lung condition, identified as bronchiolitis on a CT scan. The patient's pulmonologist is monitoring the condition, and the patient reported no current symptoms. The patient also mentioned a recent colonoscopy, which yielded good results.  The patient reported waking up in the middle of the night with racing thoughts for the past four months, which she attributed to ongoing  personal issues. She reported no significant changes in appetite, no thoughts of self-harm or harm to others, and no significant changes in medication, allergies, or medical problems. The patient expressed a desire to return to a more structured daily schedule, which she believes might help manage her stress and impulsive behaviors.  Patient denies any homicidality or perceptual disturbances.  Patient appeared to be alert, oriented to person place time situation.    Visit Diagnosis:    ICD-10-CM   1. MDD (major depressive disorder), recurrent, in full remission (HCC)  F33.42 venlafaxine XR (EFFEXOR-XR) 75 MG 24 hr capsule    buPROPion (WELLBUTRIN XL) 300 MG 24 hr tablet    2. Primary insomnia  F51.01 hydrOXYzine (ATARAX) 10 MG tablet      Past Psychiatric History: I have reviewed past psychiatric history from progress note on 12/13/2019.  Past trials of Ambien, Abilify, Effexor, Adderall, Wellbutrin, Viibryd.  Past Medical History:  Past Medical History:  Diagnosis Date   Allergic rhinitis    Anemia    Asthma    Cancer (HCC)    skin   Chest pain    a. 01/2020 MV: EF 70%, no isch/infarct. CT attenuation correction images w/ minimal Ao athersclerosis, no significant cor Ca2+.   Depression    GERD (gastroesophageal reflux disease)    Hearing loss    HTN (hypertension)    Hx of migraine headaches    Multinodular goiter    OSA on CPAP    Osteoarthritis    Thyroid disease     Past Surgical History:  Procedure Laterality Date   BREAST BIOPSY Right 2021   fibroadenoma, benign   CHOLECYSTECTOMY     COLONOSCOPY WITH PROPOFOL N/A 10/20/2022  Procedure: COLONOSCOPY WITH PROPOFOL;  Surgeon: Toledo, Boykin Nearing, MD;  Location: [facility] ENDOSCOPY;  Service: Gastroenterology;  Laterality: N/A;   HEMOSTASIS CLIP PLACEMENT  10/20/2022   Procedure: HEMOSTASIS CLIP PLACEMENT;  Surgeon: Stanton Kidney, MD;  Location: [facility] ENDOSCOPY;  Service: Gastroenterology;;   NASAL SINUS SURGERY  1996, 2007    POLYPECTOMY  10/20/2022   Procedure: POLYPECTOMY;  Surgeon: Norma Fredrickson, Boykin Nearing, MD;  Location: [facility] ENDOSCOPY;  Service: Gastroenterology;;   REPLACEMENT TOTAL KNEE Bilateral 2017   TUBAL LIGATION  1994    Family Psychiatric History: I have reviewed family psychiatric history from progress note on 12/13/2019.  Family History:  Family History  Problem Relation Age of Onset   Breast cancer Mother    Lung cancer Father    Pneumonia Father    Alcohol abuse Brother    Diabetes Brother    Heart attack Brother    COPD Brother    Insomnia Brother    Colon cancer Maternal Grandmother     Social History: I have reviewed social history from progress note on 12/13/2019. Social History   Socioeconomic History   Marital status: Married    Spouse name: Mellody Dance   Number of children: 2   Years of education: Not on file   Highest education level: Bachelor's degree (e.g., BA, AB, BS)  Occupational History    Comment: retired    Associate Professor: retired  Tobacco Use   Smoking status: Never   Smokeless tobacco: Never  Vaping Use   Vaping status: Never Used  Substance and Sexual Activity   Alcohol use: No   Drug use: No   Sexual activity: Not Currently  Other Topics Concern   Not on file  Social History Narrative   Lives with husband   Caffeine- diet coke 2 daily   Social Determinants of Health   Financial Resource Strain: Low Risk  (01/04/2023)   Overall Financial Resource Strain (CARDIA)    Difficulty of Paying Living Expenses: Not hard at all  Food Insecurity: No Food Insecurity (01/04/2023)   Hunger Vital Sign    Worried About Running Out of Food in the Last Year: Never true    Ran Out of Food in the Last Year: Never true  Transportation Needs: No Transportation Needs (01/04/2023)   PRAPARE - Administrator, Civil Service (Medical): No    Lack of Transportation (Non-Medical): No  Physical Activity: Insufficiently Active (01/04/2023)   Exercise Vital Sign    Days of  Exercise per Week: 3 days    Minutes of Exercise per Session: 30 min  Stress: No Stress Concern Present (01/04/2023)   [employer] of Occupational Health - Occupational Stress Questionnaire    Feeling of Stress : Not at all  Social Connections: Socially Integrated (01/04/2023)   Social Connection and Isolation Panel [NHANES]    Frequency of Communication with Friends and Family: Three times a week    Frequency of Social Gatherings with Friends and Family: Once a week    Attends Religious Services: More than 4 times per year    Active Member of [employer] or Organizations: Yes    Attends Banker Meetings: 1 to 4 times per year    Marital Status: Married    Allergies:  Allergies  Allergen Reactions   Aspirin Hives and Swelling    Lips only.  Lips only.  Lips only.   Sulfa Antibiotics Hives   Erythromycin Other (See Comments)    GI upset  Other reaction(s): Other (  See Comments)  GI upset  GI upset  GI upset  GI upset   Levofloxacin Other (See Comments)    Joint pain    Tetracycline Other (See Comments)    GI upset  Other reaction(s): Other (See Comments)  GI upset  GI upset  GI upset  GI upset    Metabolic Disorder Labs: No results found for: "HGBA1C", "MPG" No results found for: "PROLACTIN" Lab Results  Component Value Date   CHOL 163 09/01/2022   TRIG 149.0 09/01/2022   HDL 46.80 09/01/2022   CHOLHDL 3 09/01/2022   VLDL 29.8 09/01/2022   LDLCALC 86 09/01/2022   Lab Results  Component Value Date   TSH 3.13 09/01/2022    Therapeutic Level Labs: No results found for: "LITHIUM" No results found for: "VALPROATE" No results found for: "CBMZ"  Current Medications: Current Outpatient Medications  Medication Sig Dispense Refill   albuterol (VENTOLIN HFA) 108 (90 Base) MCG/ACT inhaler Inhale into the lungs.     Cholecalciferol 25 MCG (1000 UT) capsule Take by mouth.     clobetasol cream (TEMOVATE) 0.05 % Apply 1 Application topically 2 (two)  times daily. 30 g 0   cycloSPORINE (RESTASIS) 0.05 % ophthalmic emulsion      estradiol (VIVELLE-DOT) 0.025 MG/24HR Place 1 patch onto the skin 2 (two) times a week. 8 patch 12   Fexofenadine HCl (ALLEGRA PO) Take by mouth as needed.     fluticasone (FLONASE) 50 MCG/ACT nasal spray 2 sprays daily.      Fluticasone-Salmeterol (ADVAIR HFA IN) Inhale 1 puff into the lungs 2 (two) times daily.     hydrochlorothiazide (HYDRODIURIL) 25 MG tablet Take 1 tablet by mouth daily.     hydrOXYzine (ATARAX) 10 MG tablet Take 1-2 tablets (10-20 mg total) by mouth at bedtime as needed. For anxiety, sleep 180 tablet 1   L-Lysine 1000 MG TABS Take 1 tablet by mouth daily.     montelukast (SINGULAIR) 10 MG tablet TAKE 1 TABLET BY MOUTH DAILY 90 tablet 1   Omega-3 Fatty Acids (FISH OIL) 1200 MG CAPS Take by mouth daily in the afternoon.     omeprazole (PRILOSEC) 40 MG capsule Take 1 capsule (40 mg total) by mouth daily. 30 capsule 2   Probiotic Product (PROBIOTIC PO) Take by mouth.     progesterone (PROMETRIUM) 100 MG capsule Take by mouth.     zaleplon (SONATA) 10 MG capsule TAKE 1 CAPSULE BY MOUTH AT BEDTIME AS NEEDED FOR SLEEP. 30 capsule 4   buPROPion (WELLBUTRIN XL) 300 MG 24 hr tablet Take 1 tablet (300 mg total) by mouth in the morning. TAKE 1 TABLET BY MOUTH EVERY 24 HOURS 90 tablet 3   venlafaxine XR (EFFEXOR-XR) 75 MG 24 hr capsule Take 1 capsule (75 mg total) by mouth 2 (two) times daily with a meal. 180 capsule 3   No current facility-administered medications for this visit.     Musculoskeletal: Strength & Muscle Tone: within normal limits Gait & Station: normal Patient leans: N/A  Psychiatric Specialty Exam: Review of Systems  Psychiatric/Behavioral:  Positive for sleep disturbance. The patient is nervous/anxious.     Blood pressure 131/82, pulse 76, temperature (!) 95.9 F (35.5 C), temperature source Skin, height 5\' 5"  (1.651 m), weight 215 lb (97.5 kg).Body mass index is 35.78 kg/m.   General Appearance: Casual  Eye Contact:  Fair  Speech:  Clear and Coherent  Volume:  Normal  Mood:  Anxious  Affect:  Congruent  Thought Process:  Goal Directed and Descriptions of Associations: Intact  Orientation:  Full (Time, Place, and Person)  Thought Content: Logical   Suicidal Thoughts:  No  Homicidal Thoughts:  No  Memory:  Immediate;   Fair Recent;   Fair Remote;   Fair  Judgement:  Fair  Insight:  Fair  Psychomotor Activity:  Normal  Concentration:  Concentration: Fair and Attention Span: Fair  Recall:  [employer] of Knowledge: Fair  Language: Fair  Akathisia:  No  Handed:  Right  AIMS (if indicated): not done  Assets:  Communication Skills Desire for Improvement Housing Transportation  ADL's:  Intact  Cognition: WNL  Sleep:  Poor   Screenings: AIMS    Flowsheet Row Office Visit from 09/29/2021 in [facility] Office Visit from 10/01/2020 in [facility]  AIMS Total Score 0 0      GAD-7    Flowsheet Row Office Visit from 01/20/2023 in [facility] Office Visit from 10/16/2022 in [facility] Visit from 09/16/2022 in [facility] [place] Office Visit from 07/20/2022 in [facility] Office Visit from 01/19/2022 in [facility]  Total GAD-7 Score 1 1 0 0 2      Mini-Mental    Flowsheet Row Office Visit from 05/22/2020 in [facility]  Total Score (max 30 points ) 29      PHQ2-9    Flowsheet Row Office Visit from 01/20/2023 in [facility] Clinical Support from 01/04/2023 in [facility] [place] Office Visit from 10/16/2022 in [facility]  Visit from 09/16/2022 in [facility] [place] Office Visit from 07/20/2022 in [facility]  PHQ-2 Total Score 0 0 0 0 0  PHQ-9 Total Score -- -- 0 2 1      Flowsheet Row Office Visit from 01/20/2023 in [facility] Admission (Discharged) from 10/20/2022 in [facility] Counselor from 09/09/2022 in [facility]  C-SSRS RISK CATEGORY No Risk No Risk No Risk        Assessment and Plan: Lisa Chambers is a 72 year old Caucasian female, retired, married, has a history of depression, insomnia, multiple medical problems including obstructive sleep apnea with CPAP was evaluated in office today, patient currently struggling with sleep, anxiety at night due to multiple situational stressors, discussed assessment and plan as noted below.  Depression in remission Depression is well-managed. Reports doing well despite holiday stress. No new depressive symptoms. Emphasized the importance of therapy and scheduling with a new therapist. - Continue Wellbutrin XL 300 mg daily and venlafaxine extended release 75 mg twice daily - Refer to in-house therapist for counseling sessions   Insomnia-unstable Experiencing early morning awakenings with racing thoughts for four months. Currently on Sonata with persistent issues. Discussed adding melatonin and hydroxyzine, noting potential side effects such as constipation, dry mouth, memory problems, and falls. - Add melatonin 3-5 mg at bedtime - Prescribe hydroxyzine 10 to 20 mg as needed for middle-of-the-night awakenings.  Provided medication education including interaction with other antihistamines. - Continue Sonata 10 mg at bedtime as needed. Reviewed [place] PMP AWARxE - Discuss underlying anxiety with therapist  No changes in appetite, no self-harm  thoughts, and no new medical issues. Recent  colonoscopy was normal. Discussed maintaining a structured schedule and recommended 'Atomic Habits' and its workbook for habit management. - Read 'Atomic Habits' and use the workbook for habit management - Continue current medications and follow up with primary care as needed  Follow-up - Schedule follow-up appointment in six months or sooner if needed - Ensure appointment with in-house therapist is scheduled.   Collaboration of Care: Collaboration of Care: Referral or follow-up with counselor/therapist AEB patient to establish care with our in-house therapist.  Patient/Guardian was advised Release of Information must be obtained prior to any record release in order to collaborate their care with an outside provider. Patient/Guardian was advised if they have not already done so to contact the registration department to sign all necessary forms in order for [place] to release information regarding their care.   Consent: Patient/Guardian gives verbal consent for treatment and assignment of benefits for services provided during this visit. Patient/Guardian expressed understanding and agreed to proceed.   This note was generated in part or whole with voice recognition software. Voice recognition is usually quite accurate but there are transcription errors that can and very often do occur. I apologize for any typographical errors that were not detected and corrected.    Jomarie Longs, MD 01/20/2023, 12:31 PM

## 2023-01-20 NOTE — Patient Instructions (Signed)
Hydroxyzine Capsules or Tablets What is this medication? HYDROXYZINE (hye DROX i zeen) treats the symptoms of allergies and allergic reactions. It may also be used to treat anxiety or cause drowsiness before a procedure. It works by blocking histamine, a substance released by the body during an allergic reaction. It belongs to a group of medications called antihistamines. This medicine may be used for other purposes; ask your health care provider or pharmacist if you have questions. COMMON BRAND NAME(S): ANX, Atarax, Rezine, Vistaril What should I tell my care team before I take this medication? They need to know if you have any of these conditions: Glaucoma Heart disease Irregular heartbeat or rhythm Kidney disease Liver disease Lung or breathing disease, such as asthma Stomach or intestine problems Thyroid disease Trouble passing urine An unusual or allergic reaction to hydroxyzine, other medications, foods, dyes or preservatives Pregnant or trying to get pregnant Breastfeeding How should I use this medication? Take this medication by mouth with a full glass of water. Take it as directed on the prescription label at the same time every day. You can take it with or without food. If it upsets your stomach, take it with food. Talk to your care team about the use of this medication in children. While it may be prescribed for children as young as 6 years for selected conditions, precautions do apply. People 65 years and older may have a stronger reaction and need a smaller dose. Overdosage: If you think you have taken too much of this medicine contact a poison control center or emergency room at once. NOTE: This medicine is only for you. Do not share this medicine with others. What if I miss a dose? If you miss a dose, take it as soon as you can. If it is almost time for your next dose, take only that dose. Do not take double or extra doses. What may interact with this medication? Do not  take this medication with any of the following: Cisapride Dronedarone Pimozide Thioridazine This medication may also interact with the following: Alcohol Antihistamines for allergy, cough, and cold Atropine Barbiturate medications for sleep or seizures, such as phenobarbital Certain antibiotics, such as erythromycin or clarithromycin Certain medications for anxiety or sleep Certain medications for bladder problems, such as oxybutynin or tolterodine Certain medications for irregular heartbeat Certain medications for mental health conditions Certain medications for Parkinson disease, such as benztropine, trihexyphenidyl Certain medications for seizures, such as phenobarbital or primidone Certain medications for stomach problems, such as dicyclomine or hyoscyamine Certain medications for travel sickness, such as scopolamine Ipratropium Opioid medications for pain Other medications that cause heart rhythm changes, such as dofetilide This list may not describe all possible interactions. Give your health care provider a list of all the medicines, herbs, non-prescription drugs, or dietary supplements you use. Also tell them if you smoke, drink alcohol, or use illegal drugs. Some items may interact with your medicine. What should I watch for while using this medication? Visit your care team for regular checks on your progress. Tell your care team if your symptoms do not start to get better or if they get worse. This medication may affect your coordination, reaction time, or judgment. Do not drive or operate machinery until you know how this medication affects you. Sit up or stand slowly to reduce the risk of dizzy or fainting spells. Drinking alcohol with this medication can increase the risk of these side effects. Your mouth may get dry. Chewing sugarless gum or sucking hard candy   and drinking plenty of water may help. Contact your care team if the problem does not go away or is severe. This  medication may cause dry eyes and blurred vision. If you wear contact lenses, you may feel some discomfort. Lubricating eye drops may help. See your care team if the problem does not go away or is severe. If you are receiving skin tests for allergies, tell your care team you are taking this medication. What side effects may I notice from receiving this medication? Side effects that you should report to your care team as soon as possible: Allergic reactions--skin rash, itching, hives, swelling of the face, lips, tongue, or throat Heart rhythm changes--fast or irregular heartbeat, dizziness, feeling faint or lightheaded, chest pain, trouble breathing Side effects that usually do not require medical attention (report to your care team if they continue or are bothersome): Confusion Drowsiness Dry mouth Hallucinations Headache This list may not describe all possible side effects. Call your doctor for medical advice about side effects. You may report side effects to FDA at 1-800-FDA-1088. Where should I keep my medication? Keep out of the reach of children and pets. Store at room temperature between 15 and 30 degrees C (59 and 86 degrees F). Keep container tightly closed. Throw away any unused medication after the expiration date. NOTE: This sheet is a summary. It may not cover all possible information. If you have questions about this medicine, talk to your doctor, pharmacist, or health care provider.  2024 Elsevier/Gold Standard (2021-09-05 00:00:00)  

## 2023-02-12 ENCOUNTER — Ambulatory Visit: Payer: Medicare PPO | Admitting: Nurse Practitioner

## 2023-02-12 ENCOUNTER — Ambulatory Visit: Payer: Self-pay | Admitting: Family

## 2023-02-12 ENCOUNTER — Encounter: Payer: Self-pay | Admitting: Nurse Practitioner

## 2023-02-12 ENCOUNTER — Other Ambulatory Visit: Payer: Self-pay | Admitting: Family

## 2023-02-12 VITALS — BP 122/76 | HR 81 | Temp 98.1°F | Resp 18 | Ht 65.0 in | Wt 220.5 lb

## 2023-02-12 DIAGNOSIS — R350 Frequency of micturition: Secondary | ICD-10-CM | POA: Diagnosis not present

## 2023-02-12 DIAGNOSIS — K625 Hemorrhage of anus and rectum: Secondary | ICD-10-CM | POA: Diagnosis not present

## 2023-02-12 DIAGNOSIS — M545 Low back pain, unspecified: Secondary | ICD-10-CM

## 2023-02-12 LAB — POC URINALSYSI DIPSTICK (AUTOMATED)
Bilirubin, UA: NEGATIVE
Glucose, UA: NEGATIVE
Ketones, UA: NEGATIVE
Nitrite, UA: NEGATIVE
Protein, UA: NEGATIVE
Spec Grav, UA: <=1.005 — AB (ref 1.010–1.025)
Urobilinogen, UA: 0.2 U/dL
pH, UA: 5.5 (ref 5.0–8.0)

## 2023-02-12 MED ORDER — NITROFURANTOIN MONOHYD MACRO 100 MG PO CAPS: 100.0000 mg | ORAL_CAPSULE | Freq: Two times a day (BID) | ORAL | 0 refills | Status: DC

## 2023-02-12 NOTE — Telephone Encounter (Signed)
  Chief Complaint: urinary symptoms  Symptoms: urinary urgency, blood in toilet and on tissue(unsure if in urine), lower back pain  Frequency: x 5-7 days  Pertinent Negatives: Patient denies fever, urinary frequency, burning with urination, abdominal pain, flank pain  Disposition: [] ED /[] Urgent Care (no appt availability in office) / [x] Appointment(In office/virtual)/ []  [place] [facility]/ [] Home Care/ [] Refused Recommended Disposition /[] [place] [facility]/ []  Follow-up with PCP Additional Notes: Patient states she has been eating rich food for the holidays. Patient states she has been having soft stools and is concerned that may have caused bacteria to give her a UTI due to stool in her underwear and not properly cleaning. Patient states she is taking fiber for the stools but for the past 5-7 days she has felt urinary urgency and states it is has been not fully emptying. Patient c/o lower back pain. Patient agreeable to be seen for office visit at [facility] station instead of going to urgent care due to her PCP does not have any available appointments this afternoon.  Copied from CRM [id number]. Topic: Clinical - Pink Word Triage >> Feb 12, 2023  1:51 PM [name] wrote: Reason for Triage: Pt stated that she feels like she could have a UTI,and is also having back pain. Reason for Disposition  Side (flank) or lower back pain present  Answer Assessment - Initial Assessment Questions 1. SYMPTOM: What's the main symptom you're concerned about? (e.g., frequency, incontinence)     Urinary urgency and difficulty urinating.  2. ONSET: When did the  urinary urgency  start?     X 5- 7 days  3. PAIN: Is there any pain? If Yes, ask: How bad is it? (Scale: 1-10; mild, moderate, severe)     Middle lower back pain; 4/10 dull ache.  4. CAUSE: What do you think is causing the symptoms?     Patient states it feels like a UTI. She states she has been having softer stools (not diarrhea)  and she is concerned that she has not been able to get fully clean. She states she has been showering but she notices that some of her underwear has had stool and she worries that has caused a UTI.  5. OTHER SYMPTOMS: Do you have any other symptoms? (e.g., blood in urine, fever, flank pain, pain with urination)     Blood when wiping and in toilet  Protocols used: Urinary Symptoms-A-AH

## 2023-02-12 NOTE — Progress Notes (Signed)
 Established Patient Office Visit  Subjective:  Patient ID: Lisa Chambers, female    DOB: [birth year]  Age: 73 y.o. MRN: [id number]  CC:  Chief Complaint  Patient presents with   Back Pain   Urinary Tract Infection    Feels like she cannot completely finish urinate    HPI  Jerel MARLA Ada  with a history of urinary tract infections (UTIs), presents with a 5-7 day history of urinary urgency, frequency, and hesitancy. They deny dysuria but report a sensation of straining to urinate. Concurrently, they have been experiencing lower abdominal discomfort, described as an 'unsettled' and 'uneasy' sensation, and new-onset back pain, predominantly in the center but occasionally radiating to the right side .This is similar to their previous experience with UTIs.  The patient also reports noticing blood, which they initially attributed to an internal hemorrhoid, given a recent change in bowel habits from consuming unusual foods. They describe their stools as loose but not diarrheal, which they believe has aggravated their hemorrhoid. They have noticed bright red blood on toilet tissue and in the toilet bowl, making the water appear pink.  The patient also reports a decrease in energy levels, feeling fine in the mornings but 'running out of steam' after lunch.    HPI   Past Medical History:  Diagnosis Date   Allergic rhinitis    Anemia    Asthma    Cancer (HCC)    skin   Chest pain    a. 01/2020 MV: EF 70%, no isch/infarct. CT attenuation correction images w/ minimal Ao athersclerosis, no significant cor Ca2+.   Depression    GERD (gastroesophageal reflux disease)    Hearing loss    HTN (hypertension)    Hx of migraine headaches    Multinodular goiter    OSA on CPAP    Osteoarthritis    Thyroid  disease     Past Surgical History:  Procedure Laterality Date   BREAST BIOPSY Right 2021   fibroadenoma, benign   CHOLECYSTECTOMY     COLONOSCOPY WITH PROPOFOL  N/A 10/20/2022   Procedure:  COLONOSCOPY WITH PROPOFOL ;  Surgeon: Toledo, Ladell MARLA, MD;  Location: [facility] ENDOSCOPY;  Service: Gastroenterology;  Laterality: N/A;   HEMOSTASIS CLIP PLACEMENT  10/20/2022   Procedure: HEMOSTASIS CLIP PLACEMENT;  Surgeon: Aundria Ladell MARLA, MD;  Location: [facility] ENDOSCOPY;  Service: Gastroenterology;;   NASAL SINUS SURGERY  1996, 2007   POLYPECTOMY  10/20/2022   Procedure: POLYPECTOMY;  Surgeon: Aundria, Ladell MARLA, MD;  Location: [facility] ENDOSCOPY;  Service: Gastroenterology;;   REPLACEMENT TOTAL KNEE Bilateral 2017   TUBAL LIGATION  1994    Family History  Problem Relation Age of Onset   Breast cancer Mother    Lung cancer Father    Pneumonia Father    Alcohol abuse Brother    Diabetes Brother    Heart attack Brother    COPD Brother    Insomnia Brother    Colon cancer Maternal Grandmother     Social History   Socioeconomic History   Marital status: Married    Spouse name: Francis   Number of children: 2   Years of education: Not on file   Highest education level: Bachelor's degree (e.g., BA, AB, BS)  Occupational History    Comment: retired    Associate Professor: retired  Tobacco Use   Smoking status: Never   Smokeless tobacco: Never  Vaping Use   Vaping status: Never Used  Substance and Sexual Activity   Alcohol use: No  Drug use: No   Sexual activity: Not Currently  Other Topics Concern   Not on file  Social History Narrative   Lives with husband   Caffeine- diet coke 2 daily   Social Drivers of Health   Financial Resource Strain: Low Risk  (01/04/2023)   Overall Financial Resource Strain (CARDIA)    Difficulty of Paying Living Expenses: Not hard at all  Food Insecurity: No Food Insecurity (01/04/2023)   Hunger Vital Sign    Worried About Running Out of Food in the Last Year: Never true    Ran Out of Food in the Last Year: Never true  Transportation Needs: No Transportation Needs (01/04/2023)   PRAPARE - Administrator, Civil Service (Medical): No    Lack of  Transportation (Non-Medical): No  Physical Activity: Insufficiently Active (01/04/2023)   Exercise Vital Sign    Days of Exercise per Week: 3 days    Minutes of Exercise per Session: 30 min  Stress: No Stress Concern Present (01/04/2023)   [employer] of Occupational Health - Occupational Stress Questionnaire    Feeling of Stress : Not at all  Social Connections: Socially Integrated (01/04/2023)   Social Connection and Isolation Panel [NHANES]    Frequency of Communication with Friends and Family: Three times a week    Frequency of Social Gatherings with Friends and Family: Once a week    Attends Religious Services: More than 4 times per year    Active Member of [employer] or Organizations: Yes    Attends Banker Meetings: 1 to 4 times per year    Marital Status: Married  Catering Manager Violence: Not At Risk (01/04/2023)   Humiliation, Afraid, Rape, and Kick questionnaire    Fear of Current or Ex-Partner: No    Emotionally Abused: No    Physically Abused: No    Sexually Abused: No     Outpatient Medications Prior to Visit  Medication Sig Dispense Refill   albuterol  (VENTOLIN  HFA) 108 (90 Base) MCG/ACT inhaler Inhale into the lungs.     buPROPion  (WELLBUTRIN  XL) 300 MG 24 hr tablet Take 1 tablet (300 mg total) by mouth in the morning. TAKE 1 TABLET BY MOUTH EVERY 24 HOURS 90 tablet 3   Cholecalciferol 25 MCG (1000 UT) capsule Take by mouth.     clobetasol  cream (TEMOVATE ) 0.05 % Apply 1 Application topically 2 (two) times daily. 30 g 0   cycloSPORINE (RESTASIS) 0.05 % ophthalmic emulsion      estradiol  (VIVELLE -DOT) 0.025 MG/24HR Place 1 patch onto the skin 2 (two) times a week. 8 patch 12   Fexofenadine HCl (ALLEGRA PO) Take by mouth as needed.     fluticasone (FLONASE) 50 MCG/ACT nasal spray 2 sprays daily.      Fluticasone-Salmeterol (ADVAIR HFA IN) Inhale 1 puff into the lungs 2 (two) times daily.     hydrochlorothiazide (HYDRODIURIL) 25 MG tablet Take 1 tablet  by mouth daily.     hydrOXYzine  (ATARAX ) 10 MG tablet Take 1-2 tablets (10-20 mg total) by mouth at bedtime as needed. For anxiety, sleep 180 tablet 1   L-Lysine 1000 MG TABS Take 1 tablet by mouth daily.     montelukast (SINGULAIR) 10 MG tablet TAKE 1 TABLET BY MOUTH DAILY 90 tablet 1   Omega-3 Fatty Acids (FISH OIL) 1200 MG CAPS Take by mouth daily in the afternoon.     omeprazole  (PRILOSEC) 40 MG capsule TAKE 1 CAPSULE (40 MG TOTAL) BY MOUTH DAILY. 30 capsule  2   Probiotic Product (PROBIOTIC PO) Take by mouth.     progesterone (PROMETRIUM) 100 MG capsule Take by mouth.     venlafaxine  XR (EFFEXOR -XR) 75 MG 24 hr capsule Take 1 capsule (75 mg total) by mouth 2 (two) times daily with a meal. 180 capsule 3   zaleplon  (SONATA ) 10 MG capsule TAKE 1 CAPSULE BY MOUTH AT BEDTIME AS NEEDED FOR SLEEP. 30 capsule 4   No facility-administered medications prior to visit.    Allergies  Allergen Reactions   Aspirin Hives and Swelling    Lips only.  Lips only.  Lips only.   Sulfa Antibiotics Hives   Erythromycin Other (See Comments)    GI upset  Other reaction(s): Other (See Comments)  GI upset  GI upset  GI upset  GI upset   Levofloxacin Other (See Comments)    Joint pain    Tetracycline Other (See Comments)    GI upset  Other reaction(s): Other (See Comments)  GI upset  GI upset  GI upset  GI upset    ROS Review of Systems Negative unless indicated in HPI.    Objective:    Physical Exam Constitutional:      Appearance: Normal appearance.  Cardiovascular:     Rate and Rhythm: Normal rate and regular rhythm.     Pulses: Normal pulses.     Heart sounds: Normal heart sounds.  Abdominal:     General: Bowel sounds are normal.     Palpations: Abdomen is soft.     Tenderness: There is abdominal tenderness in the suprapubic area. There is no right CVA tenderness or left CVA tenderness.  Musculoskeletal:     Cervical back: Normal range of motion.  Neurological:     General:  No focal deficit present.     Mental Status: She is alert. Mental status is at baseline.  Psychiatric:        Mood and Affect: Mood normal.        Behavior: Behavior normal.        Thought Content: Thought content normal.        Judgment: Judgment normal.     BP 122/76   Pulse 81   Temp 98.1 F (36.7 C)   Resp 18   Ht 5' 5 (1.651 m)   Wt 220 lb 8 oz (100 kg)   SpO2 98%   BMI 36.69 kg/m  Wt Readings from Last 3 Encounters:  02/12/23 220 lb 8 oz (100 kg)  01/04/23 215 lb (97.5 kg)  01/04/23 215 lb 9.6 oz (97.8 kg)     Health Maintenance  Topic Date Due   COVID-19 Vaccine (3 - Pfizer risk series) 11/29/2019   INFLUENZA VACCINE  05/10/2023 (Originally 09/10/2022)   Hepatitis C Screening  01/04/2024 (Originally 12/22/1968)   Medicare Annual Wellness (AWV)  01/04/2024   MAMMOGRAM  02/25/2024   DTaP/Tdap/Td (3 - Td or Tdap) 07/15/2026   Colonoscopy  10/19/2032   Pneumonia Vaccine 58+ Years old  Completed   DEXA SCAN  Completed   Zoster Vaccines- Shingrix  Completed   HPV VACCINES  Aged Out    There are no preventive care reminders to display for this patient.  Lab Results  Component Value Date   TSH 3.13 09/01/2022   Lab Results  Component Value Date   WBC 7.1 01/04/2023   HGB 13.9 01/04/2023   HCT 42.8 01/04/2023   MCV 84.9 01/04/2023   PLT 311.0 01/04/2023   Lab Results  Component Value  Date   NA 137 01/04/2023   K 3.8 01/04/2023   CO2 28 01/04/2023   GLUCOSE 78 01/04/2023   BUN 15 01/04/2023   CREATININE 0.75 01/04/2023   BILITOT 0.4 11/24/2011   ALKPHOS 95 11/24/2011   AST 24 11/24/2011   ALT 28 11/24/2011   PROT 6.8 11/24/2011   ALBUMIN 3.4 11/24/2011   CALCIUM 9.4 01/04/2023   ANIONGAP 11 07/15/2022   GFR 80.88 01/04/2023   Lab Results  Component Value Date   CHOL 163 09/01/2022   Lab Results  Component Value Date   HDL 46.80 09/01/2022   Lab Results  Component Value Date   LDLCALC 86 09/01/2022   Lab Results  Component Value Date    TRIG 149.0 09/01/2022   Lab Results  Component Value Date   CHOLHDL 3 09/01/2022   No results found for: HGBA1C    Assessment & Plan:  Urinary frequency Assessment & Plan: New onset of urinary urgency, frequency, and hesitancy over the past 5-7 days. No dysuria. Lower abdominal discomfort and new back pain. History of UTI with similar symptoms. Urinalysis shows leukocytes and blood, suggestive of UTI or possible kidney stone. -Start Macrobid  empirically, pending culture results.   Orders: -     POCT Urinalysis Dipstick (Automated) -     Urine Culture -     Urinalysis, Routine w reflex microscopic  Rectal bleeding Assessment & Plan: Reports bright red blood on toilet paper and pinkish water in the toilet  intermittently since New Year's Eve. History of internal hemorrhoids and recent changes in bowel habits with loose stools. -Provide fecal occult blood test kit for home use to rule out rectal bleeding.  Orders: -     Fecal occult blood, imunochemical; Future  Acute midline low back pain without sciatica Assessment & Plan: New onset, possibly related to recent physical activity. Pain is central but sometimes more on the right side. -Advise on stretching exercises and monitor for changes.   Other orders -     Nitrofurantoin  Monohyd Macro; Take 1 capsule (100 mg total) by mouth 2 (two) times daily.  Dispense: 14 capsule; Refill: 0 -     MICROSCOPIC MESSAGE    Follow-up: Return if symptoms worsen or fail to improve.   Ohn Bostic, NP

## 2023-02-13 DIAGNOSIS — R350 Frequency of micturition: Secondary | ICD-10-CM | POA: Insufficient documentation

## 2023-02-13 DIAGNOSIS — M545 Low back pain, unspecified: Secondary | ICD-10-CM | POA: Insufficient documentation

## 2023-02-13 DIAGNOSIS — K625 Hemorrhage of anus and rectum: Secondary | ICD-10-CM | POA: Insufficient documentation

## 2023-02-13 LAB — URINALYSIS, ROUTINE W REFLEX MICROSCOPIC
Bacteria, UA: NONE SEEN /[HPF]
Bilirubin Urine: NEGATIVE
Glucose, UA: NEGATIVE
Hyaline Cast: NONE SEEN /[LPF]
Ketones, ur: NEGATIVE
Nitrite: NEGATIVE
Protein, ur: NEGATIVE
RBC / HPF: NONE SEEN /[HPF] (ref 0–2)
Specific Gravity, Urine: 1.007 (ref 1.001–1.035)
pH: 6 (ref 5.0–8.0)

## 2023-02-13 LAB — URINE CULTURE
MICRO NUMBER:: 15915066
SPECIMEN QUALITY:: ADEQUATE

## 2023-02-13 LAB — MICROSCOPIC MESSAGE

## 2023-02-13 NOTE — Assessment & Plan Note (Signed)
 New onset of urinary urgency, frequency, and hesitancy over the past 5-7 days. No dysuria. Lower abdominal discomfort and new back pain. History of UTI with similar symptoms. Urinalysis shows leukocytes and blood, suggestive of UTI or possible kidney stone. -Start Macrobid  empirically, pending culture results.

## 2023-02-13 NOTE — Assessment & Plan Note (Signed)
 Reports bright red blood on toilet paper and pinkish water in the toilet  intermittently since New Year's Eve. History of internal hemorrhoids and recent changes in bowel habits with loose stools. -Provide fecal occult blood test kit for home use to rule out rectal bleeding.

## 2023-02-13 NOTE — Assessment & Plan Note (Signed)
 New onset, possibly related to recent physical activity. Pain is central but sometimes more on the right side. -Advise on stretching exercises and monitor for changes.

## 2023-02-13 NOTE — Patient Instructions (Signed)
 Please await the results of your urine culture and fecal occult blood test. Follow the instructions provided for the fecal occult blood test kit and return it as directed. If your symptoms persist or worsen, or if you have any concerns, please contact our office.

## 2023-02-14 NOTE — Telephone Encounter (Signed)
 Noted.

## 2023-02-15 ENCOUNTER — Ambulatory Visit (INDEPENDENT_AMBULATORY_CARE_PROVIDER_SITE_OTHER): Payer: Medicare PPO | Admitting: Professional Counselor

## 2023-02-15 DIAGNOSIS — F4320 Adjustment disorder, unspecified: Secondary | ICD-10-CM | POA: Diagnosis not present

## 2023-02-15 DIAGNOSIS — F4323 Adjustment disorder with mixed anxiety and depressed mood: Secondary | ICD-10-CM
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
## 2023-02-16 ENCOUNTER — Other Ambulatory Visit: Payer: Self-pay | Admitting: Family

## 2023-02-16 ENCOUNTER — Encounter: Payer: Self-pay | Admitting: Nurse Practitioner

## 2023-02-18 ENCOUNTER — Telehealth: Payer: Self-pay | Admitting: Family

## 2023-02-18 NOTE — Telephone Encounter (Signed)
 Copied from CRM [id number]. Topic: Referral - Request for Referral >> Feb 18, 2023  3:24 PM Tiffany H wrote: Did the patient discuss referral with their provider in the last year? No (If No - schedule appointment) - Patient was just seen on 02/12/23. An additional appointment isn't necessary.  (If Yes - send message)  Appointment offered? No  Type of order/referral and detailed reason for visit: Patient wants to have a Mammorgram but was told that she must have a diagnostic mammogram first. She was told to call PCP to request order for Diagnositc Mammogram w/US . Patient advised that she's recently had intermittent pain - discomfort around the perimeter of left breast. Pain level at 3.   Preference of office, provider, location: [facility] - [address] [place], [place], [place] [address].   If referral order, have you been seen by this specialty before? Yes (If Yes, this issue or another issue? When? Where?) Annual mammograms.   Can we respond through MyChart? Yes

## 2023-02-18 NOTE — Telephone Encounter (Signed)
 I can not order a diagnostic without a physical exam. Needs appt.

## 2023-02-19 NOTE — Telephone Encounter (Signed)
lvm for pt to call office to reschedule

## 2023-02-23 ENCOUNTER — Encounter: Payer: Self-pay | Admitting: Family

## 2023-02-23 ENCOUNTER — Other Ambulatory Visit: Payer: Self-pay | Admitting: Family

## 2023-02-25 NOTE — Telephone Encounter (Signed)
Can you please call and ask her Q below. She didn't respond via mychart.   I see you recently saw gynecology and per note had also discussed weaning off of hormones. Did you end up stopping? Is gynecology going to continue to prescribe?

## 2023-03-02 ENCOUNTER — Ambulatory Visit: Payer: Medicare PPO | Admitting: Professional Counselor

## 2023-03-03 ENCOUNTER — Other Ambulatory Visit: Payer: Self-pay

## 2023-03-03 ENCOUNTER — Emergency Department (HOSPITAL_COMMUNITY): Payer: Medicare PPO

## 2023-03-03 ENCOUNTER — Emergency Department (HOSPITAL_COMMUNITY)
Admission: EM | Admit: 2023-03-03 | Discharge: 2023-03-03 | Disposition: A | Discharge status: Home / Self Care | Payer: Medicare PPO | Attending: Emergency Medicine | Admitting: Emergency Medicine

## 2023-03-03 DIAGNOSIS — I10 Essential (primary) hypertension: Secondary | ICD-10-CM | POA: Insufficient documentation

## 2023-03-03 DIAGNOSIS — R42 Dizziness and giddiness: Secondary | ICD-10-CM | POA: Diagnosis not present

## 2023-03-03 DIAGNOSIS — J45909 Unspecified asthma, uncomplicated: Secondary | ICD-10-CM | POA: Diagnosis not present

## 2023-03-03 DIAGNOSIS — R29818 Other symptoms and signs involving the nervous system: Secondary | ICD-10-CM | POA: Diagnosis not present

## 2023-03-03 DIAGNOSIS — Z85828 Personal history of other malignant neoplasm of skin: Secondary | ICD-10-CM | POA: Insufficient documentation

## 2023-03-03 DIAGNOSIS — Z7951 Long term (current) use of inhaled steroids: Secondary | ICD-10-CM | POA: Insufficient documentation

## 2023-03-03 DIAGNOSIS — Z79899 Other long term (current) drug therapy: Secondary | ICD-10-CM | POA: Diagnosis not present

## 2023-03-03 DIAGNOSIS — I6782 Cerebral ischemia: Secondary | ICD-10-CM | POA: Diagnosis not present

## 2023-03-03 DIAGNOSIS — R531 Weakness: Secondary | ICD-10-CM | POA: Diagnosis not present

## 2023-03-03 LAB — CBC
HCT: 42.3 % (ref 36.0–46.0)
Hemoglobin: 13.7 g/dL (ref 12.0–15.0)
MCH: 27.6 pg (ref 26.0–34.0)
MCHC: 32.4 g/dL (ref 30.0–36.0)
MCV: 85.3 fL (ref 80.0–100.0)
Platelets: 335 10*3/uL (ref 150–400)
RBC: 4.96 MIL/uL (ref 3.87–5.11)
RDW: 13 % (ref 11.5–15.5)
WBC: 7.4 10*3/uL (ref 4.0–10.5)
nRBC: 0 % (ref 0.0–0.2)

## 2023-03-03 LAB — BASIC METABOLIC PANEL
Anion gap: 11 (ref 5–15)
BUN: 14 mg/dL (ref 8–23)
CO2: 26 mmol/L (ref 22–32)
Calcium: 9 mg/dL (ref 8.9–10.3)
Chloride: 102 mmol/L (ref 98–111)
Creatinine, Ser: 0.71 mg/dL (ref 0.44–1.00)
GFR, Estimated: >60 mL/min (ref >60)
Glucose, Bld: 105 mg/dL — ABNORMAL HIGH (ref 70–99)
Potassium: 3.5 mmol/L (ref 3.5–5.1)
Sodium: 139 mmol/L (ref 135–145)

## 2023-03-03 LAB — CBG MONITORING, ED: Glucose-Capillary: 114 mg/dL — ABNORMAL HIGH (ref 70–99)

## 2023-03-03 NOTE — ED Triage Notes (Addendum)
EMS stated, pt has had dizziness for 2 weeks . Pt. Stated, I also have a feeling my head is all blown up. Im also lethargic

## 2023-03-03 NOTE — ED Provider Triage Note (Signed)
Emergency Medicine Provider Triage Evaluation Note  [name] , a 73 y.o. female  was evaluated in triage.  Pt complains of dizziness, balance difficulties over past 2 weeks, worse today, feels falling over to side .  Review of Systems  Positive: Generalized weakness Negative: Vertigo, numbness, tingling, focal weakness  Physical Exam  BP (!) 147/89 (BP Location: Right Arm)   Pulse 67   Temp 98 F (36.7 C)   Resp 18   Ht 5\' 5"  (1.651 m)   Wt 96.6 kg   SpO2 95%   BMI 35.45 kg/m  Gen:   Awake, no distress   Resp:  Normal effort  MSK:   Moves extremities without difficulty  Other:  Cranial nerves II through XII intact, strength 5 out of 5 in the bilateral upper and lower extremities, no sensory deficit to light touch, ?slight left sided dysmetria    Medical Decision Making  Medically screening exam initiated at 11:32 AM.  Appropriate orders placed.  Lisa Chambers was informed that the remainder of the evaluation will be completed by another provider, this initial triage assessment does not replace that evaluation, and the importance of remaining in the ED until their evaluation is complete.     Lisa Grandchild, MD 03/03/23 1134

## 2023-03-03 NOTE — ED Provider Notes (Signed)
[place] EMERGENCY DEPARTMENT AT [facility] Provider Note   CSN: [id number] Arrival date & time: 03/03/23  1045     History  Chief Complaint  Patient presents with   Dizziness    Lisa Chambers is a 73 y.o. female.  73 year old female presents today for concern of dizziness.  Symptoms ongoing for the past couple weeks with intermittent episodes.  Episodes occur several times a day lasting only for a brief moment.  No chest pain, shortness of breath, nausea, vomiting.  Denies weakness.  She states sometimes she is at rest when her symptoms occur.  The history is provided by the patient. No language interpreter was used.       Home Medications Prior to Admission medications   Medication Sig Start Date End Date Taking? Authorizing Provider  albuterol (VENTOLIN HFA) 108 (90 Base) MCG/ACT inhaler Inhale into the lungs. 02/19/21   [provider]  buPROPion (WELLBUTRIN XL) 300 MG 24 hr tablet Take 1 tablet (300 mg total) by mouth in the morning. TAKE 1 TABLET BY MOUTH EVERY 24 HOURS 01/20/23   Jomarie Longs, MD  Cholecalciferol 25 MCG (1000 UT) capsule Take by mouth.    [provider]  clobetasol cream (TEMOVATE) 0.05 % Apply 1 Application topically 2 (two) times daily. 09/16/22   Mort Sawyers, FNP  cycloSPORINE (RESTASIS) 0.05 % ophthalmic emulsion     [provider]  estradiol (VIVELLE-DOT) 0.025 MG/24HR Place 1 patch onto the skin 2 (two) times a week. 09/03/22   Mort Sawyers, FNP  Fexofenadine HCl (ALLEGRA PO) Take by mouth as needed.    [provider]  fluticasone (FLONASE) 50 MCG/ACT nasal spray 2 sprays daily.  11/18/10   [provider]  Fluticasone-Salmeterol (ADVAIR HFA IN) Inhale 1 puff into the lungs 2 (two) times daily.    [provider]  hydrochlorothiazide (HYDRODIURIL) 25 MG tablet TAKE 1 TABLET BY MOUTH ONCE DAILY 02/16/23   Mort Sawyers, FNP  hydrOXYzine (ATARAX) 10 MG tablet Take 1-2 tablets  (10-20 mg total) by mouth at bedtime as needed. For anxiety, sleep 01/20/23   Jomarie Longs, MD  L-Lysine 1000 MG TABS Take 1 tablet by mouth daily.    [provider]  montelukast (SINGULAIR) 10 MG tablet TAKE 1 TABLET BY MOUTH DAILY 10/21/22   Mort Sawyers, FNP  nitrofurantoin, macrocrystal-monohydrate, (MACROBID) 100 MG capsule Take 1 capsule (100 mg total) by mouth 2 (two) times daily. 02/12/23   Kara Dies, NP  Omega-3 Fatty Acids (FISH OIL) 1200 MG CAPS Take by mouth daily in the afternoon.    [provider]  omeprazole (PRILOSEC) 40 MG capsule TAKE 1 CAPSULE (40 MG TOTAL) BY MOUTH DAILY. 02/12/23   Mort Sawyers, FNP  Probiotic Product (PROBIOTIC PO) Take by mouth.    [provider]  progesterone (PROMETRIUM) 100 MG capsule TAKE 1 CAPSULE (100 MG TOTAL) BY MOUTH ONCE DAILY 02/25/23   Mort Sawyers, FNP  venlafaxine XR (EFFEXOR-XR) 75 MG 24 hr capsule Take 1 capsule (75 mg total) by mouth 2 (two) times daily with a meal. 01/20/23   Eappen, Levin Bacon, MD  zaleplon (SONATA) 10 MG capsule TAKE 1 CAPSULE BY MOUTH AT BEDTIME AS NEEDED FOR SLEEP. 10/16/22   Jomarie Longs, MD      Allergies    Aspirin, Sulfa antibiotics, Erythromycin, Levofloxacin, and Tetracycline    Review of Systems   Review of Systems  Constitutional:  Negative for chills and fever.  Respiratory:  Negative for shortness of  breath.   Cardiovascular:  Negative for chest pain.  Neurological:  Positive for dizziness. Negative for light-headedness.  All other systems reviewed and are negative.   Physical Exam Updated Vital Signs BP (!) 147/89 (BP Location: Right Arm)   Pulse 67   Temp 98 F (36.7 C)   Resp 18   Ht 5\' 5"  (1.651 m)   Wt 96.6 kg   SpO2 95%   BMI 35.45 kg/m  Physical Exam Vitals and nursing note reviewed.  Constitutional:      General: She is not in acute distress.    Appearance: Normal appearance. She is not ill-appearing.  HENT:     Head: Normocephalic and  atraumatic.     Nose: Nose normal.  Eyes:     Conjunctiva/sclera: Conjunctivae normal.  Cardiovascular:     Rate and Rhythm: Normal rate and regular rhythm.  Pulmonary:     Effort: Pulmonary effort is normal. No respiratory distress.     Breath sounds: No wheezing.  Musculoskeletal:        General: No deformity. Normal range of motion.     Cervical back: Normal range of motion.  Skin:    Findings: No rash.  Neurological:     General: No focal deficit present.     Mental Status: She is alert and oriented to person, place, and time. Mental status is at baseline.     Cranial Nerves: No cranial nerve deficit.     Sensory: No sensory deficit.     Motor: No weakness.     ED Results / Procedures / Treatments   Labs (all labs ordered are listed, but only abnormal results are displayed) Labs Reviewed  BASIC METABOLIC PANEL - Abnormal; Notable for the following components:      Result Value   Glucose, Bld 105 (*)    All other components within normal limits  CBG MONITORING, ED - Abnormal; Notable for the following components:   Glucose-Capillary 114 (*)    All other components within normal limits  CBC  URINALYSIS, ROUTINE W REFLEX MICROSCOPIC    EKG EKG Interpretation Date/Time:  Wednesday March 03 2023 13:54:59 EST Ventricular Rate:  65 PR Interval:  172 QRS Duration:  101 QT Interval:  436 QTC Calculation: 454 R Axis:   32  Text Interpretation: Sinus rhythm Low voltage, precordial leads Nonspecific T wave abnormality Confirmed by Cathren Laine ([id number]) on 03/03/2023 1:58:57 PM  Radiology MR BRAIN WO CONTRAST Result Date: 03/03/2023 CLINICAL DATA:  Neuro deficit, acute, stroke suspected EXAM: MRI HEAD WITHOUT CONTRAST TECHNIQUE: Multiplanar, multiecho pulse sequences of the brain and surrounding structures were obtained without intravenous contrast. COMPARISON:  CT venogram 12/29/2020 FINDINGS: Brain: Negative for an acute infarct. No mass effect. No mass lesion. No  extra-axial fluid collection. No hydrocephalus. No hemorrhage. There is a background of moderate chronic microvascular ischemic change. Vascular: Normal flow voids. Redemonstrated right sigmoid sinus diverticulum. Skull and upper cervical spine: Normal marrow signal. Sinuses/Orbits: No middle ear or mastoid effusion. Paranasal sinuses are notable for mucosal thickening in the bilateral maxillary sinuses. Orbits are unremarkable. Other: None. IMPRESSION: No acute intracranial process. Electronically Signed   By: Lorenza Cambridge M.D.   On: 03/03/2023 12:32    Procedures Procedures    Medications Ordered in ED Medications - No data to display  ED Course/ Medical Decision Making/ A&P  Medical Decision Making Amount and/or Complexity of Data Reviewed Labs: ordered. ECG/medicine tests: ordered.   Medical Decision Making / ED Course   This patient presents to the ED for concern of dizziness, this involves an extensive number of treatment options, and is a complaint that carries with it a high risk of complications and morbidity.  The differential diagnosis includes vertigo, CVA, Mnire's disease, orthostasis  MDM: 73 year old female presents with above-mentioned complaints.  Overall well-appearing.  Currently without symptoms.  CBC unremarkable, BMP without acute concern.  Glucose 114.  MRI brain without contrast obtained.  No acute process.  Likely a peripheral process.  Discussed increasing hydration.  She does have history of erosion of right sigmoid plate, and apparent dehiscence of the right mastoid.  She has followed up with ENT at [facility] for this.  She will try to follow-up with [facility] again regarding this.  Will give referral to our ENT as well as neurologist.  Patient is in agreement with plan.  Discharged in stable condition.   Additional history obtained: -Additional history obtained from chart review from care everywhere including CT from  12/09/2020. -External records from outside source obtained and reviewed including: Chart review including previous notes, labs, imaging, consultation notes   Lab Tests: -I ordered, reviewed, and interpreted labs.   The pertinent results include:   Labs Reviewed  BASIC METABOLIC PANEL - Abnormal; Notable for the following components:      Result Value   Glucose, Bld 105 (*)    All other components within normal limits  CBG MONITORING, ED - Abnormal; Notable for the following components:   Glucose-Capillary 114 (*)    All other components within normal limits  CBC  URINALYSIS, ROUTINE W REFLEX MICROSCOPIC      EKG  EKG Interpretation Date/Time:  Wednesday March 03 2023 13:54:59 EST Ventricular Rate:  65 PR Interval:  172 QRS Duration:  101 QT Interval:  436 QTC Calculation: 454 R Axis:   32  Text Interpretation: Sinus rhythm Low voltage, precordial leads Nonspecific T wave abnormality Confirmed by Cathren Laine ([id number]) on 03/03/2023 1:58:57 PM         Imaging Studies ordered: I ordered imaging studies including mri brain wo contrast I independently visualized and interpreted imaging. I agree with the radiologist interpretation   Medicines ordered and prescription drug management: No orders of the defined types were placed in this encounter.   -I have reviewed the patients home medicines and have made adjustments as needed     Reevaluation: After the interventions noted above, I reevaluated the patient and found that they have :improved  Co morbidities that complicate the patient evaluation  Past Medical History:  Diagnosis Date   Allergic rhinitis    Anemia    Asthma    Cancer (HCC)    skin   Chest pain    a. 01/2020 MV: EF 70%, no isch/infarct. CT attenuation correction images w/ minimal Ao athersclerosis, no significant cor Ca2+.   Depression    GERD (gastroesophageal reflux disease)    Hearing loss    HTN (hypertension)    Hx of migraine  headaches    Multinodular goiter    OSA on CPAP    Osteoarthritis    Thyroid disease       Dispostion: Discharged in stable condition.  Return precaution discussed.  Patient voices understanding and is in agreement with plan.   Final Clinical Impression(s) / ED Diagnoses Final diagnoses:  Dizziness    Rx / DC Orders  ED Discharge Orders          Ordered    Ambulatory referral to Neurology       Comments: An appointment is requested in approximately: 2 weeks   03/03/23 1419              Marita Kansas, PA-C 03/03/23 1419    Cathren Laine, MD 03/05/23 [phone]

## 2023-03-03 NOTE — Discharge Instructions (Addendum)
Your workup was reassuring.  I have given you a referral to ENT and neurologist.  Drink plenty of fluids.  Cut back on caffeinated drinks.  For any concerning symptoms return to the emergency room.

## 2023-03-04 ENCOUNTER — Telehealth (INDEPENDENT_AMBULATORY_CARE_PROVIDER_SITE_OTHER): Payer: Self-pay | Admitting: Otolaryngology

## 2023-03-04 NOTE — Telephone Encounter (Signed)
   ED Follow up from Dr Jearld Fenton

## 2023-03-05 ENCOUNTER — Telehealth (INDEPENDENT_AMBULATORY_CARE_PROVIDER_SITE_OTHER): Payer: Self-pay | Admitting: Otolaryngology

## 2023-03-05 ENCOUNTER — Encounter: Payer: Self-pay | Admitting: Family

## 2023-03-05 NOTE — Telephone Encounter (Signed)
Called patient to schedule ED follow, left voicemail message to return call to set up appointment.

## 2023-03-07 NOTE — Telephone Encounter (Signed)
Pt needs ER f/u to discuss further

## 2023-03-08 ENCOUNTER — Encounter: Payer: Self-pay | Admitting: Medical

## 2023-03-08 ENCOUNTER — Telehealth: Payer: Self-pay | Admitting: Family

## 2023-03-08 ENCOUNTER — Ambulatory Visit: Payer: Medicare PPO | Attending: Medical | Admitting: Medical

## 2023-03-08 VITALS — BP 142/92 | HR 88 | Ht 65.0 in | Wt 213.6 lb

## 2023-03-08 DIAGNOSIS — E782 Mixed hyperlipidemia: Secondary | ICD-10-CM

## 2023-03-08 DIAGNOSIS — I10 Essential (primary) hypertension: Secondary | ICD-10-CM | POA: Diagnosis not present

## 2023-03-08 DIAGNOSIS — I2510 Atherosclerotic heart disease of native coronary artery without angina pectoris: Secondary | ICD-10-CM | POA: Diagnosis not present

## 2023-03-08 DIAGNOSIS — R42 Dizziness and giddiness: Secondary | ICD-10-CM | POA: Diagnosis not present

## 2023-03-08 NOTE — Patient Instructions (Signed)
Medication Instructions:  Your physician recommends that you continue on your current medications as directed. Please refer to the Current Medication list given to you today.   *If you need a refill on your cardiac medications before your next appointment, please call your pharmacy*   Lab Work: No labs ordered today    Testing/Procedures: No test ordered today    Follow-Up: At [facility], you and your health needs are our priority.  As part of our continuing mission to provide you with exceptional heart care, we have created designated Provider Care Teams.  These Care Teams include your primary Cardiologist (physician) and Advanced Practice Providers (APPs -  Physician Assistants and Nurse Practitioners) who all work together to provide you with the care you need, when you need it.  We recommend signing up for the patient portal called "MyChart".  Sign up information is provided on this After Visit Summary.  MyChart is used to connect with patients for Virtual Visits (Telemedicine).  Patients are able to view lab/test results, encounter notes, upcoming appointments, etc.  Non-urgent messages can be sent to your provider as well.   To learn more about what you can do with MyChart, go to [email or web address].    Your next appointment:   3 month(s)  Provider:   You may see Lorine Bears, MD or one of the following Advanced Practice Providers on your designated Care Team:   Nicolasa Ducking, NP Eula Listen, PA-C Cadence Fransico Michael, PA-C Charlsie Quest, NP Carlos Levering, NP

## 2023-03-08 NOTE — Telephone Encounter (Signed)
LVM or patient to cb and schedule a Hospital follow up.

## 2023-03-08 NOTE — Telephone Encounter (Signed)
Good morning,   In regards to this pt I am reaching out for more information. As far as the hormones she said that age 73 is your goal for being off of HRT completely.   I am interested in learning more from your gynecological prospective. Everything I seem to read on up to date recommends weaning off of HRT by age 24. With the chronic vaginitis do we suspect vaginal atrophy ? I just want to make sure I am utilizing the appropriate recommendations moving forward, and would love your input for not only [facility] but my other female PMP patients.   Thanks for your time!

## 2023-03-08 NOTE — Progress Notes (Unsigned)
Cardiology Office Note:    Date:  03/09/2023   ID:  Lisa Chambers, DOB Jan 25, 1951, MRN [id number]  PCP:  Mort Sawyers, FNP  [facility] Cardiologist:  Lorine Bears, MD  [facility] Electrophysiologist:  None   Referring MD: Mort Sawyers, FNP   Chief Complaint: ER follow-up  History of Present Illness:    Lisa Chambers is a 73 y.o. female with a hx of hypertension, obesity, nonobstructive CAD, and hyperlipidemia who is being seen for follow-up.  Her brother had CABG and PAD.  She was seen in 2021 for intermittent atypical chest pain.  Lexiscan Myoview showed no evidence of ischemia within normal EF.  Cardiac CTA in June 2024 for exertional dyspnea showed coronary calcium score of 19 with minimal nonobstructive CAD.  Patient was last seen in June 2024 and was overall stable from a cardiac perspective.  Seen in the ER 03/03/23 for dizziness via EMS. She was fixing hot tea and felt dizzy and walked a short distance to the couch. She sat down and felt she couldn't hold her head up. BP 147/89, pulse 67bpm, afebrile. EKG with NSR and nonspecific t wave changes. Labs were unremarkable. MRI brain unremarkable.   The reports that dizziness started 3 weeks ago. Its not vertigo. It happened in the afternoon after working at her desk for a while. She went to lay down and slept for 3 hours. Dizziness can occur with sitting or standing. Had been feeling fatigue for about a month. Saw ENT, apparently has issues with mastoid on the right side, and has been told this can cause dizziness. Has not made a appointment with nuerolgoy yet. BP a little high today. 117/69, 135/82. She denies chest pain, SOB, LLE, orthopnea, pnd, palpitations, or heart racing.  Past Medical History:  Diagnosis Date   Allergic rhinitis    Anemia    Asthma    Cancer (HCC)    skin   Chest pain    a. 01/2020 MV: EF 70%, no isch/infarct. CT attenuation correction images w/ minimal Ao athersclerosis, no significant cor  Ca2+.   Depression    GERD (gastroesophageal reflux disease)    Hearing loss    HTN (hypertension)    Hx of migraine headaches    Multinodular goiter    OSA on CPAP    Osteoarthritis    Thyroid disease     Past Surgical History:  Procedure Laterality Date   BREAST BIOPSY Right 2021   fibroadenoma, benign   CHOLECYSTECTOMY     COLONOSCOPY WITH PROPOFOL N/A 10/20/2022   Procedure: COLONOSCOPY WITH PROPOFOL;  Surgeon: Toledo, Boykin Nearing, MD;  Location: [facility] ENDOSCOPY;  Service: Gastroenterology;  Laterality: N/A;   HEMOSTASIS CLIP PLACEMENT  10/20/2022   Procedure: HEMOSTASIS CLIP PLACEMENT;  Surgeon: Stanton Kidney, MD;  Location: [facility] ENDOSCOPY;  Service: Gastroenterology;;   NASAL SINUS SURGERY  1996, 2007   POLYPECTOMY  10/20/2022   Procedure: POLYPECTOMY;  Surgeon: Norma Fredrickson, Boykin Nearing, MD;  Location: [facility] ENDOSCOPY;  Service: Gastroenterology;;   REPLACEMENT TOTAL KNEE Bilateral 2017   TUBAL LIGATION  1994    Current Medications: Current Meds  Medication Sig   albuterol (VENTOLIN HFA) 108 (90 Base) MCG/ACT inhaler Inhale into the lungs.   buPROPion (WELLBUTRIN XL) 300 MG 24 hr tablet Take 1 tablet (300 mg total) by mouth in the morning. TAKE 1 TABLET BY MOUTH EVERY 24 HOURS   Cholecalciferol 25 MCG (1000 UT) capsule Take by mouth daily.   clobetasol cream (TEMOVATE) 0.05 %  Apply 1 Application topically 2 (two) times daily.   cycloSPORINE (RESTASIS) 0.05 % ophthalmic emulsion    estradiol (VIVELLE-DOT) 0.025 MG/24HR Place 1 patch onto the skin 2 (two) times a week.   Fexofenadine HCl (ALLEGRA PO) Take by mouth as needed.   fluticasone (FLONASE) 50 MCG/ACT nasal spray 2 sprays daily.    Fluticasone-Salmeterol (ADVAIR HFA IN) Inhale 1 puff into the lungs 2 (two) times daily.   hydrochlorothiazide (HYDRODIURIL) 25 MG tablet TAKE 1 TABLET BY MOUTH ONCE DAILY   hydrOXYzine (ATARAX) 10 MG tablet Take 1-2 tablets (10-20 mg total) by mouth at bedtime as needed. For anxiety, sleep    L-Lysine 1000 MG TABS Take 1 tablet by mouth daily.   montelukast (SINGULAIR) 10 MG tablet TAKE 1 TABLET BY MOUTH DAILY   Omega-3 Fatty Acids (FISH OIL) 1200 MG CAPS Take by mouth daily in the afternoon.   omeprazole (PRILOSEC) 40 MG capsule TAKE 1 CAPSULE (40 MG TOTAL) BY MOUTH DAILY.   Probiotic Product (PROBIOTIC PO) Take by mouth daily.   progesterone (PROMETRIUM) 100 MG capsule TAKE 1 CAPSULE (100 MG TOTAL) BY MOUTH ONCE DAILY   venlafaxine XR (EFFEXOR-XR) 75 MG 24 hr capsule Take 1 capsule (75 mg total) by mouth 2 (two) times daily with a meal.   zaleplon (SONATA) 10 MG capsule TAKE 1 CAPSULE BY MOUTH AT BEDTIME AS NEEDED FOR SLEEP.     Allergies:   Aspirin, Sulfa antibiotics, Erythromycin, Levofloxacin, and Tetracycline   Social History   Socioeconomic History   Marital status: Married    Spouse name: Mellody Dance   Number of children: 2   Years of education: Not on file   Highest education level: Bachelor's degree (e.g., BA, AB, BS)  Occupational History    Comment: retired    Associate Professor: retired  Tobacco Use   Smoking status: Never   Smokeless tobacco: Never  Vaping Use   Vaping status: Never Used  Substance and Sexual Activity   Alcohol use: No   Drug use: No   Sexual activity: Not Currently  Other Topics Concern   Not on file  Social History Narrative   Lives with husband   Caffeine- diet coke 2 daily   Social Drivers of Health   Financial Resource Strain: Low Risk  (02/15/2023)   Overall Financial Resource Strain (CARDIA)    Difficulty of Paying Living Expenses: Not very hard  Food Insecurity: No Food Insecurity (02/15/2023)   Hunger Vital Sign    Worried About Running Out of Food in the Last Year: Never true    Ran Out of Food in the Last Year: Never true  Transportation Needs: No Transportation Needs (02/15/2023)   PRAPARE - Administrator, Civil Service (Medical): No    Lack of Transportation (Non-Medical): No  Physical Activity: Insufficiently Active  (02/15/2023)   Exercise Vital Sign    Days of Exercise per Week: 2 days    Minutes of Exercise per Session: 30 min  Stress: Stress Concern Present (02/15/2023)   [employer] of Occupational Health - Occupational Stress Questionnaire    Feeling of Stress : To some extent  Social Connections: Moderately Integrated (02/15/2023)   Social Connection and Isolation Panel [NHANES]    Frequency of Communication with Friends and Family: Twice a week    Frequency of Social Gatherings with Friends and Family: Once a week    Attends Religious Services: More than 4 times per year    Active Member of Clubs or Organizations: No  Attends Banker Meetings: Never    Marital Status: Married     Family History: The patient's family history includes Alcohol abuse in her brother; Breast cancer in her mother; COPD in her brother; Colon cancer in her maternal grandmother; Diabetes in her brother; Heart attack in her brother; Insomnia in her brother; Lung cancer in her father; Pneumonia in her father.  ROS:   Please see the history of present illness.     All other systems reviewed and are negative.  EKGs/Labs/Other Studies Reviewed:    The following studies were reviewed today:  Cardiac CTA 07/2022  IMPRESSION: 1. Coronary calcium score of 19.3. This was 55th percentile for age and sex matched control.   2. Normal coronary origin with right dominance.   3. Minimal proximal RCA stenosis (<25%).   4. CAD-RADS 1. Minimal non-obstructive CAD (0-24%). Consider preventive therapy and risk factor modification.   Electronically Signed: By: Debbe Odea M.D. On: 07/23/2022 16:09    Myoview lexiscan 01/2020 Narrative & Impression  Pharmacological myocardial perfusion imaging study with no significant  ischemia Normal wall motion, EF estimated at 70%, GI uptake artifact noted No EKG changes concerning for ischemia at peak stress or in recovery. CT attenuation correction images:  Minimal aortic atherosclerosis, no significant coronary calcification Low risk scan     Recent Labs: 09/01/2022: TSH 3.13 03/03/2023: BUN 14; Creatinine, Ser 0.71; Hemoglobin 13.7; Platelets 335; Potassium 3.5; Sodium 139  Recent Lipid Panel    Component Value Date/Time   CHOL 163 09/01/2022 1142   TRIG 149.0 09/01/2022 1142   HDL 46.80 09/01/2022 1142   CHOLHDL 3 09/01/2022 1142   VLDL 29.8 09/01/2022 1142   LDLCALC 86 09/01/2022 1142    Physical Exam:    VS:  BP (!) 142/92 (BP Location: Left Arm, Patient Position: Sitting, Cuff Size: Normal)   Pulse 88   Ht 5\' 5"  (1.651 m)   Wt 213 lb 9.6 oz (96.9 kg)   SpO2 97%   BMI 35.54 kg/m     Wt Readings from Last 3 Encounters:  03/08/23 213 lb 9.6 oz (96.9 kg)  03/03/23 213 lb (96.6 kg)  02/12/23 220 lb 8 oz (100 kg)     GEN:  Well nourished, well developed in no acute distress HEENT: Normal NECK: No JVD; No carotid bruits LYMPHATICS: No lymphadenopathy CARDIAC: RRR, no murmurs, rubs, gallops RESPIRATORY:  Clear to auscultation without rales, wheezing or rhonchi  ABDOMEN: Soft, non-tender, non-distended MUSCULOSKELETAL:  No edema; No deformity  SKIN: Warm and dry NEUROLOGIC:  Alert and oriented x 3 PSYCHIATRIC:  Normal affect   ASSESSMENT:    1. Dizziness   2. Essential hypertension   3. Hyperlipidemia, mixed   4. Coronary artery disease involving native coronary artery of native heart without angina pectoris    PLAN:    In order of problems listed above:  Dizziness Patient reports 3 week history of dizziness. She was seen in the ER 03/03/23 and work-up was unremarkable. BP has been labile. Patient has history of mastoid issues seen by ENT in the past. Orthostatics negative today. Dizziness does not sound cardiac in origin, but we cannot completely rule this out. EKG today showed NSR with nonspecific ST changes. We discussed options such as heart monitor, echo, carotid US, but we will re-visit this until she can see  ENT and neurology. We will see her back in 3 months to discuss symptoms at that time.   HTN BP a little high today. She  is on hydrochlorothiazide. Plan to see PCP to discuss changing medication.   HLD LDL 86, HDL 46, TG 149, total chol 163. The patient is not on cholesterol medication at baseline.  Nonobstructive CAD Prior cardiac CTA 2024 showed minimal nonobstructive CAD. No anginal symptoms reported. No further work-up indicated.   Disposition: Follow up in 3 month(s) with MD/APP    Signed, Deola Rewis David Stall, PA-C  03/09/2023 8:23 AM     [facility]

## 2023-03-09 ENCOUNTER — Telehealth (INDEPENDENT_AMBULATORY_CARE_PROVIDER_SITE_OTHER): Payer: Self-pay | Admitting: Otolaryngology

## 2023-03-09 NOTE — Telephone Encounter (Signed)
Patient called and requested to cancel her ed referral to [facility] ENT. She is being seen elsewhere.

## 2023-03-10 ENCOUNTER — Encounter: Payer: Self-pay | Admitting: Family Medicine

## 2023-03-10 ENCOUNTER — Encounter: Payer: Self-pay | Admitting: Family

## 2023-03-10 ENCOUNTER — Ambulatory Visit: Payer: Medicare PPO | Admitting: Family

## 2023-03-10 VITALS — BP 134/88 | HR 79 | Temp 98.5°F | Ht 65.0 in | Wt 213.8 lb

## 2023-03-10 DIAGNOSIS — H838X1 Other specified diseases of right inner ear: Secondary | ICD-10-CM | POA: Insufficient documentation

## 2023-03-10 DIAGNOSIS — Z79890 Hormone replacement therapy (postmenopausal): Secondary | ICD-10-CM | POA: Diagnosis not present

## 2023-03-10 DIAGNOSIS — R42 Dizziness and giddiness: Secondary | ICD-10-CM | POA: Diagnosis not present

## 2023-03-10 DIAGNOSIS — Z803 Family history of malignant neoplasm of breast: Secondary | ICD-10-CM | POA: Diagnosis not present

## 2023-03-10 DIAGNOSIS — N644 Mastodynia: Secondary | ICD-10-CM | POA: Diagnosis not present

## 2023-03-10 DIAGNOSIS — N6323 Unspecified lump in the left breast, lower outer quadrant: Secondary | ICD-10-CM

## 2023-03-10 DIAGNOSIS — E538 Deficiency of other specified B group vitamins: Secondary | ICD-10-CM | POA: Insufficient documentation

## 2023-03-10 DIAGNOSIS — R2232 Localized swelling, mass and lump, left upper limb: Secondary | ICD-10-CM | POA: Diagnosis not present

## 2023-03-10 NOTE — Assessment & Plan Note (Signed)
Was in Er, reviewed ER visit records.  EKG normal limits.  MRI no acute concerns.  Workup in place, pending consult with ENT and neurology If workup negative will go back to cardiology for addtl workup  Labs reviewed.

## 2023-03-10 NOTE — Assessment & Plan Note (Addendum)
Literature can agree to extended HRT in some special cases with benefit vs risk Pt on progesterone/estrogen  Reviewed note from gynecology who suggested wean by age 73.  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 I have reached out to gynecology as well.  Pt with fmh breast cancer, however mom was BRCA negative.

## 2023-03-10 NOTE — Progress Notes (Signed)
Established Patient Office Visit  Subjective:      CC:  Chief Complaint  Patient presents with   Medical Management of Chronic Issues    ER follow up.    HPI: Lisa Chambers is a 73 y.o. female presenting on 03/10/2023 for Medical Management of Chronic Issues (ER follow up.) . Went to ER 03/03/23 for c/o dizziness for the last few weeks prior. Cbc bmp without acute concern. Glucose 114. MRI brain without contrast without acute process, did show some moderate chronic microvascular ischemic change. Suspected peripheral process, she is followed by ENT for right mastoid dehiscence. Was referred to [facility] instead for ENT and neurology per ER note. In ER EKG sinus rhythm. Vertigo was r/o.   Was seen by cardiology 1/27 for f/u. They took in symptoms per pt report and discussed could consider echo, carotid u/s and heart monitor in future pending ENT and neuro workup.   She has appt with Dr. Manson Passey at [facility] for 03/22/23.   New complaints: She states dizziness still comes and goes. She states she couldn't walk after a dizzy spell came over her while sitting at a desk prior to ER visit. She slept for three hours after this happened. Will come on when she Is still, doesn't have to be moving around. The day of the ER visit she was standing at the sink and all of a sudden her 'hearing got weird she felt like she was in a fog' slowly walked and sat down on the couch and couldn't seem to hold her head up. Her husband tried to check her blood pressure and he couldn't take it because she couldn't lift her arm, felt weak, and states she couldn't even speak.   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 She is currently 73 y/o on hormonal therapy. Per note with gynecology recommendation was to cessate by age 66. She is currently estradiol 1 patch two times weekly. And also progesterone 100 mg once daily.   Has some 'odd sensations' on the left  side of her breast that are intermittent. No nipple discharge, change in breast with rash and or color, no palpable mass when she presses. She feels the 'tingling' over the last one year.  Mammogram 02/24/22 no suspicious findings.     Social history:  Relevant past medical, surgical, family and social history reviewed and updated as indicated. Interim medical history since our last visit reviewed.  Allergies and medications reviewed and updated.  DATA REVIEWED: CHART IN EPIC     ROS: Negative unless specifically indicated above in HPI.    Current Outpatient Medications:    albuterol (VENTOLIN HFA) 108 (90 Base) MCG/ACT inhaler, Inhale into the lungs., Disp: , Rfl:    buPROPion (WELLBUTRIN XL) 300 MG 24 hr tablet, Take 1 tablet (300 mg total) by mouth in the morning. TAKE 1 TABLET BY MOUTH EVERY 24 HOURS, Disp: 90 tablet, Rfl: 3   Cholecalciferol 25 MCG (1000 UT) capsule, Take by mouth daily., Disp: , Rfl:    clobetasol cream (TEMOVATE) 0.05 %, Apply 1 Application topically 2 (two) times daily., Disp: 30 g, Rfl: 0   cycloSPORINE (RESTASIS) 0.05 % ophthalmic emulsion, , Disp: , Rfl:    estradiol (VIVELLE-DOT) 0.025 MG/24HR, Place 1 patch onto the skin 2 (two) times a week., Disp: 8 patch, Rfl: 12   Fexofenadine HCl (ALLEGRA PO), Take by mouth as needed., Disp: , Rfl:  fluticasone (FLONASE) 50 MCG/ACT nasal spray, 2 sprays daily. , Disp: , Rfl:    Fluticasone-Salmeterol (ADVAIR HFA IN), Inhale 1 puff into the lungs 2 (two) times daily., Disp: , Rfl:    hydrochlorothiazide (HYDRODIURIL) 25 MG tablet, TAKE 1 TABLET BY MOUTH ONCE DAILY, Disp: 90 tablet, Rfl: 2   hydrOXYzine (ATARAX) 10 MG tablet, Take 1-2 tablets (10-20 mg total) by mouth at bedtime as needed. For anxiety, sleep, Disp: 180 tablet, Rfl: 1   L-Lysine 1000 MG TABS, Take 1 tablet by mouth daily., Disp: , Rfl:    montelukast (SINGULAIR) 10 MG tablet, TAKE 1 TABLET BY MOUTH DAILY, Disp: 90 tablet, Rfl: 1   Omega-3 Fatty Acids  (FISH OIL) 1200 MG CAPS, Take by mouth daily in the afternoon., Disp: , Rfl:    omeprazole (PRILOSEC) 40 MG capsule, TAKE 1 CAPSULE (40 MG TOTAL) BY MOUTH DAILY., Disp: 30 capsule, Rfl: 2   Probiotic Product (PROBIOTIC PO), Take by mouth daily., Disp: , Rfl:    progesterone (PROMETRIUM) 100 MG capsule, TAKE 1 CAPSULE (100 MG TOTAL) BY MOUTH ONCE DAILY, Disp: 90 capsule, Rfl: 1   venlafaxine XR (EFFEXOR-XR) 75 MG 24 hr capsule, Take 1 capsule (75 mg total) by mouth 2 (two) times daily with a meal., Disp: 180 capsule, Rfl: 3   zaleplon (SONATA) 10 MG capsule, TAKE 1 CAPSULE BY MOUTH AT BEDTIME AS NEEDED FOR SLEEP., Disp: 30 capsule, Rfl: 4      Objective:    BP 134/88 (BP Location: Left Arm, Patient Position: Sitting, Cuff Size: Large)   Pulse 79   Temp 98.5 F (36.9 C) (Temporal)   Ht 5\' 5"  (1.651 m)   Wt 213 lb 12.8 oz (97 kg)   SpO2 97%   BMI 35.58 kg/m   Wt Readings from Last 3 Encounters:  03/10/23 213 lb 12.8 oz (97 kg)  03/08/23 213 lb 9.6 oz (96.9 kg)  03/03/23 213 lb (96.6 kg)    Physical Exam Constitutional:      General: She is not in acute distress.    Appearance: Normal appearance. She is normal weight. She is not ill-appearing, toxic-appearing or diaphoretic.  HENT:     Head: Normocephalic.  Cardiovascular:     Rate and Rhythm: Normal rate and regular rhythm.  Pulmonary:     Effort: Pulmonary effort is normal.  Chest:  Breasts:    Right: Inverted nipple present. No nipple discharge.     Left: Inverted nipple and mass (left lower outer breast tenderness with small mass) present. No nipple discharge.  Musculoskeletal:        General: Normal range of motion.  Lymphadenopathy:     Upper Body:     Left upper body: Axillary adenopathy (tenderness with palpable mass) present.  Neurological:     General: No focal deficit present.     Mental Status: She is alert and oriented to person, place, and time. Mental status is at baseline.  Psychiatric:        Mood and  Affect: Mood normal.        Behavior: Behavior normal.        Thought Content: Thought content normal.        Judgment: Judgment normal.           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    Breast pain, left Assessment & Plan: Diag mammo and  left breast u/s ordered pending results.   Orders: -     [place] LIMITED ULTRASOUND INCLUDING AXILLA LEFT BREAST ; Future -     MM 3D DIAGNOSTIC MAMMOGRAM UNILATERAL LEFT BREAST; Future  Family history of breast cancer in mother -     [place] LIMITED ULTRASOUND INCLUDING AXILLA LEFT BREAST ; Future -     MM 3D DIAGNOSTIC MAMMOGRAM UNILATERAL LEFT BREAST; Future  Mass of lower outer quadrant of left breast -     [place] LIMITED ULTRASOUND INCLUDING AXILLA LEFT BREAST ; Future -     MM 3D DIAGNOSTIC MAMMOGRAM UNILATERAL LEFT BREAST; Future  Mass of left axilla -     [place] LIMITED ULTRASOUND INCLUDING AXILLA LEFT BREAST ; Future -     MM 3D DIAGNOSTIC MAMMOGRAM UNILATERAL LEFT BREAST; Future  Dizziness Assessment & Plan: Was in Er, reviewed ER visit records.  EKG normal limits.  MRI no acute concerns.  Workup in place, pending consult with ENT and neurology If workup negative will go back to cardiology for addtl workup  Labs reviewed.    Hormone replacement therapy Assessment & Plan: Literature can agree to extended HRT in some special cases with benefit vs risk Pt on progesterone/estrogen  Reviewed note from gynecology who suggested wean by age 34.  I have asked pt that she get refills from gyencology as also being followed for lichen sclerosis and vaginitis as I do worry about long term HRT past 73 y/o. I have reached out to gynecology as well.  Pt with fmh breast cancer, however mom was BRCA negative.        Return for as scheduled for CPE.  Mort Sawyers, MSN, APRN, FNP-C [place] [facility]

## 2023-03-10 NOTE — Assessment & Plan Note (Signed)
Pt to f/u with ENT as scheduled.

## 2023-03-10 NOTE — Assessment & Plan Note (Signed)
Diag mammo and left breast u/s ordered pending results.

## 2023-03-16 ENCOUNTER — Ambulatory Visit: Payer: Medicare PPO | Admitting: Professional Counselor

## 2023-03-17 ENCOUNTER — Other Ambulatory Visit: Payer: Self-pay | Admitting: Family

## 2023-03-17 ENCOUNTER — Ambulatory Visit (INDEPENDENT_AMBULATORY_CARE_PROVIDER_SITE_OTHER): Payer: Medicare PPO | Admitting: Professional Counselor

## 2023-03-17 DIAGNOSIS — N9489 Other specified conditions associated with female genital organs and menstrual cycle: Secondary | ICD-10-CM

## 2023-03-17 DIAGNOSIS — F4320 Adjustment disorder, unspecified: Secondary | ICD-10-CM | POA: Diagnosis not present

## 2023-03-17 DIAGNOSIS — Z85828 Personal history of other malignant neoplasm of skin: Secondary | ICD-10-CM | POA: Diagnosis not present

## 2023-03-17 DIAGNOSIS — L570 Actinic keratosis: Secondary | ICD-10-CM | POA: Diagnosis not present

## 2023-03-17 DIAGNOSIS — D485 Neoplasm of uncertain behavior of skin: Secondary | ICD-10-CM | POA: Diagnosis not present

## 2023-03-17 DIAGNOSIS — C44519 Basal cell carcinoma of skin of other part of trunk: Secondary | ICD-10-CM | POA: Diagnosis not present

## 2023-03-17 DIAGNOSIS — L821 Other seborrheic keratosis: Secondary | ICD-10-CM | POA: Diagnosis not present

## 2023-03-17 DIAGNOSIS — L814 Other melanin hyperpigmentation: Secondary | ICD-10-CM | POA: Diagnosis not present

## 2023-03-17 DIAGNOSIS — N898 Other specified noninflammatory disorders of vagina: Secondary | ICD-10-CM

## 2023-03-17 DIAGNOSIS — N761 Subacute and chronic vaginitis: Secondary | ICD-10-CM

## 2023-03-17 DIAGNOSIS — C44712 Basal cell carcinoma of skin of right lower limb, including hip: Secondary | ICD-10-CM | POA: Diagnosis not present

## 2023-03-17 DIAGNOSIS — L309 Dermatitis, unspecified: Secondary | ICD-10-CM | POA: Diagnosis not present

## 2023-03-17 NOTE — Progress Notes (Signed)
   THERAPIST PROGRESS NOTE  Session Time: 2:03 PM - 2:56 PM  Participation Level: Active  Behavioral Response: Well Groomed, Alert, Anxious  Type of Therapy: Individual Therapy  Treatment Goals addressed: Active OP Depression  LTG: To make some changes to my behavior that become a pattern with me that help me feel less stressed or less right a rat running around trying to get everything done.    Start:  03/17/23    Expected End:  03/15/24     STG: To identify what I'm feeling and to speak about it or act on it at the very beginning instead of waiting until you get dumped on and explode.    STG: To identify tasks that I don't want to do and get my husband to do them. To improve assertiveness skills over the next 90 days.     STG: Lisa Chambers will earn to accept and adjust as she moves though stages of life   ProgressTowards Goals: Initial  Interventions: Motivational Interviewing and CBT  Summary: Lisa Chambers is a 73 y.o. female who presents with a history of MDD. She appeared alert and oriented x5. She was neatly dressed and well-groomed. Lisa Chambers reported she's been having some medical concerns, resulting in dizzy spells. She has been able to set some boundaries and stop micro-managing as much, which she has found to be helpful. Lisa Chambers engaged in developing her treatment plan. She appears hopeful about making more progress in therapy.   Therapist Response: Conducted session with [name]. Began session with check-in/update since previous session. Utilized empathetic and reflective listening. Praised Web Designer on setting healthy boundaries with her husband and allowing him to take care of things he's capable of doing. Developed treatment plan with input from Tab on current strengths, needs, and progress towards goals. Encouraged Rona to be mindful of her perception/perspective on things. Scheduled an additional follow-up appointment and concluded session.   Suicidal/Homicidal: No  Plan:  Return again in 2 weeks.  Diagnosis: Adjustment disorder, unspecified type  Collaboration of Care: Medication Management AEB chart review  Patient/Guardian was advised Release of Information must be obtained prior to any record release in order to collaborate their care with an outside provider. Patient/Guardian was advised if they have not already done so to contact the registration department to sign all necessary forms in order for us  to release information regarding their care.   Consent: Patient/Guardian gives verbal consent for treatment and assignment of benefits for services provided during this visit. Patient/Guardian expressed understanding and agreed to proceed.   [name], [facility] 03/17/2023

## 2023-03-18 ENCOUNTER — Other Ambulatory Visit: Payer: Self-pay | Admitting: Psychiatry

## 2023-03-18 DIAGNOSIS — F5101 Primary insomnia: Secondary | ICD-10-CM

## 2023-03-18 DIAGNOSIS — F3342 Major depressive disorder, recurrent, in full remission: Secondary | ICD-10-CM

## 2023-03-22 DIAGNOSIS — H8109 Meniere's disease, unspecified ear: Secondary | ICD-10-CM | POA: Diagnosis not present

## 2023-03-24 DIAGNOSIS — G4733 Obstructive sleep apnea (adult) (pediatric): Secondary | ICD-10-CM | POA: Diagnosis not present

## 2023-03-25 DIAGNOSIS — C44712 Basal cell carcinoma of skin of right lower limb, including hip: Secondary | ICD-10-CM | POA: Diagnosis not present

## 2023-03-25 DIAGNOSIS — Z85828 Personal history of other malignant neoplasm of skin: Secondary | ICD-10-CM | POA: Diagnosis not present

## 2023-03-30 ENCOUNTER — Ambulatory Visit: Payer: Medicare PPO | Admitting: Professional Counselor

## 2023-03-31 ENCOUNTER — Ambulatory Visit: Payer: Self-pay | Admitting: Professional Counselor

## 2023-04-05 ENCOUNTER — Ambulatory Visit
Admission: RE | Admit: 2023-04-05 | Discharge: 2023-04-05 | Disposition: A | Discharge status: Home / Self Care | Payer: Medicare PPO | Source: Ambulatory Visit | Attending: Family

## 2023-04-05 ENCOUNTER — Ambulatory Visit
Admission: RE | Admit: 2023-04-05 | Discharge: 2023-04-05 | Disposition: A | Discharge status: Home / Self Care | Payer: Medicare PPO | Source: Ambulatory Visit | Attending: Family | Admitting: Family

## 2023-04-05 DIAGNOSIS — R2232 Localized swelling, mass and lump, left upper limb: Secondary | ICD-10-CM

## 2023-04-05 DIAGNOSIS — Z803 Family history of malignant neoplasm of breast: Secondary | ICD-10-CM

## 2023-04-05 DIAGNOSIS — N644 Mastodynia: Secondary | ICD-10-CM

## 2023-04-05 DIAGNOSIS — N6323 Unspecified lump in the left breast, lower outer quadrant: Secondary | ICD-10-CM

## 2023-04-06 ENCOUNTER — Encounter: Payer: Self-pay | Admitting: Family

## 2023-04-08 ENCOUNTER — Ambulatory Visit (INDEPENDENT_AMBULATORY_CARE_PROVIDER_SITE_OTHER): Payer: Medicare PPO | Admitting: Family

## 2023-04-08 ENCOUNTER — Encounter: Payer: Self-pay | Admitting: Family

## 2023-04-08 VITALS — BP 126/86 | HR 78 | Temp 98.1°F | Ht 65.0 in | Wt 218.2 lb

## 2023-04-08 DIAGNOSIS — Z136 Encounter for screening for cardiovascular disorders: Secondary | ICD-10-CM

## 2023-04-08 DIAGNOSIS — R7303 Prediabetes: Secondary | ICD-10-CM | POA: Diagnosis not present

## 2023-04-08 DIAGNOSIS — H04123 Dry eye syndrome of bilateral lacrimal glands: Secondary | ICD-10-CM

## 2023-04-08 DIAGNOSIS — R42 Dizziness and giddiness: Secondary | ICD-10-CM | POA: Diagnosis not present

## 2023-04-08 DIAGNOSIS — E782 Mixed hyperlipidemia: Secondary | ICD-10-CM

## 2023-04-08 DIAGNOSIS — E538 Deficiency of other specified B group vitamins: Secondary | ICD-10-CM

## 2023-04-08 DIAGNOSIS — Z0000 Encounter for general adult medical examination without abnormal findings: Secondary | ICD-10-CM | POA: Insufficient documentation

## 2023-04-08 DIAGNOSIS — N1831 Chronic kidney disease, stage 3a: Secondary | ICD-10-CM

## 2023-04-08 DIAGNOSIS — I10 Essential (primary) hypertension: Secondary | ICD-10-CM

## 2023-04-08 DIAGNOSIS — Z780 Asymptomatic menopausal state: Secondary | ICD-10-CM

## 2023-04-08 DIAGNOSIS — I129 Hypertensive chronic kidney disease with stage 1 through stage 4 chronic kidney disease, or unspecified chronic kidney disease: Secondary | ICD-10-CM | POA: Diagnosis not present

## 2023-04-08 DIAGNOSIS — Z79899 Other long term (current) drug therapy: Secondary | ICD-10-CM | POA: Insufficient documentation

## 2023-04-08 DIAGNOSIS — N761 Subacute and chronic vaginitis: Secondary | ICD-10-CM

## 2023-04-08 LAB — LIPID PANEL
Cholesterol: 170 mg/dL (ref 0–200)
HDL: 53.3 mg/dL (ref >39.00)
LDL Cholesterol: 94 mg/dL (ref 0–99)
NonHDL: 117.18
Total CHOL/HDL Ratio: 3
Triglycerides: 115 mg/dL (ref 0.0–149.0)
VLDL: 23 mg/dL (ref 0.0–40.0)

## 2023-04-08 LAB — VITAMIN B12: Vitamin B-12: >1537 pg/mL — ABNORMAL HIGH (ref 211–911)

## 2023-04-08 LAB — HEMOGLOBIN A1C: Hgb A1c MFr Bld: 5.8 % (ref 4.6–6.5)

## 2023-04-08 LAB — MICROALBUMIN / CREATININE URINE RATIO
Creatinine,U: 19.1 mg/dL
Microalb Creat Ratio: 36.7 mg/g — ABNORMAL HIGH (ref 0.0–30.0)
Microalb, Ur: <0.7 mg/dL (ref 0.0–1.9)

## 2023-04-08 NOTE — Assessment & Plan Note (Signed)
 Cont f/u with ophthalmology

## 2023-04-08 NOTE — Patient Instructions (Signed)
 I have sent an electronic order over to your preferred location for the following:   []   2D Mammogram  []   3D Mammogram  [x]   Bone Density   Please give this center a call to get scheduled at your convenience.  [x]   [facility]  [address] [place] [address]  [phone]  Make sure to wear two piece  clothing  No lotions powders or deodorants the day of the appointment Make sure to bring picture ID and insurance card.  Bring list of medications you are currently taking including any supplements.

## 2023-04-08 NOTE — Assessment & Plan Note (Signed)

## 2023-04-08 NOTE — Assessment & Plan Note (Signed)
 Stable continue hydrochlorothiazide

## 2023-04-08 NOTE — Progress Notes (Signed)
 Subjective:  Patient ID: Lisa Chambers, female    DOB: 03/28/1950  Age: 73 y.o. MRN: [id number]  Patient Care Team: Mort Sawyers, FNP as PCP - General (Family Medicine) Iran Ouch, MD as PCP - Cardiology (Cardiology) Dingeldein, Viviann Spare, MD (Ophthalmology)   CC:  Chief Complaint  Patient presents with   Annual Exam    HPI Lisa Chambers is a 73 y.o. female who presents today for an annual physical exam. She reports consuming a general diet.  Gym a few times a week, walking three times a week  She generally feels well. She reports sleeping fairly well. She does not have additional problems to discuss today.   Vision:Within last year Dental:Receives regular dental care  Mammogram: 04/05/23  Colonoscopy:10/20/22 every five years due to family history  Bone density scan:  Pt is without acute concerns.   Dizziness, recently f/u with Dr. Manson Passey, ENT, cardiology had recommended the ent and neuro referral, neuro appt is in march. She has seen ophthalmology as well. Ddx seizure activity vs anxiety per ent note. Ent gave her valium for prn use. Last episode was 2/17 not since.   Advanced Directives Patient does have advanced directives . She does not have a copy in the electronic medical record.   DEPRESSION SCREENING    04/08/2023   11:25 AM 03/10/2023    9:45 AM 02/15/2023    2:10 PM 02/12/2023    3:53 PM 01/20/2023    9:51 AM 01/04/2023    2:39 PM 10/16/2022    9:29 AM  PHQ 2/9 Scores  PHQ - 2 Score 0 0  0  0 0  PHQ- 9 Score 0 0  2   0     Information is confidential and restricted. Go to Review Flowsheets to unlock data.     ROS: Negative unless specifically indicated above in HPI.    Current Outpatient Medications:    albuterol (VENTOLIN HFA) 108 (90 Base) MCG/ACT inhaler, Inhale into the lungs., Disp: , Rfl:    buPROPion (WELLBUTRIN XL) 300 MG 24 hr tablet, Take 1 tablet (300 mg total) by mouth in the morning. TAKE 1 TABLET BY MOUTH EVERY 24 HOURS, Disp: 90 tablet,  Rfl: 3   Cholecalciferol 25 MCG (1000 UT) capsule, Take by mouth daily., Disp: , Rfl:    clobetasol cream (TEMOVATE) 0.05 %, APPLY 1 APPLICATION TOPICALLY 2 (TWO) TIMES DAILY., Disp: 30 g, Rfl: 0   cycloSPORINE (RESTASIS) 0.05 % ophthalmic emulsion, , Disp: , Rfl:    estradiol (VIVELLE-DOT) 0.025 MG/24HR, Place 1 patch onto the skin 2 (two) times a week., Disp: 8 patch, Rfl: 12   Fexofenadine HCl (ALLEGRA PO), Take by mouth as needed., Disp: , Rfl:    fluticasone (FLONASE) 50 MCG/ACT nasal spray, 2 sprays daily. , Disp: , Rfl:    Fluticasone-Salmeterol (ADVAIR HFA IN), Inhale 1 puff into the lungs 2 (two) times daily., Disp: , Rfl:    hydrochlorothiazide (HYDRODIURIL) 25 MG tablet, TAKE 1 TABLET BY MOUTH ONCE DAILY, Disp: 90 tablet, Rfl: 2   hydrOXYzine (ATARAX) 10 MG tablet, Take 1-2 tablets (10-20 mg total) by mouth at bedtime as needed. For anxiety, sleep, Disp: 180 tablet, Rfl: 1   L-Lysine 1000 MG TABS, Take 1 tablet by mouth daily., Disp: , Rfl:    montelukast (SINGULAIR) 10 MG tablet, TAKE 1 TABLET BY MOUTH DAILY, Disp: 90 tablet, Rfl: 1   Omega-3 Fatty Acids (FISH OIL) 1200 MG CAPS, Take by mouth daily in the afternoon.,  Disp: , Rfl:    omeprazole (PRILOSEC) 40 MG capsule, TAKE 1 CAPSULE (40 MG TOTAL) BY MOUTH DAILY., Disp: 30 capsule, Rfl: 2   Probiotic Product (PROBIOTIC PO), Take by mouth daily., Disp: , Rfl:    progesterone (PROMETRIUM) 100 MG capsule, TAKE 1 CAPSULE (100 MG TOTAL) BY MOUTH ONCE DAILY, Disp: 90 capsule, Rfl: 1   venlafaxine XR (EFFEXOR-XR) 75 MG 24 hr capsule, Take 1 capsule (75 mg total) by mouth 2 (two) times daily with a meal., Disp: 180 capsule, Rfl: 3   zaleplon (SONATA) 10 MG capsule, TAKE 1 CAPSULE BY MOUTH AT BEDTIME AS NEEDED FOR SLEEP., Disp: 30 capsule, Rfl: 5    Objective:    BP 126/86 (BP Location: Left Arm, Patient Position: Sitting, Cuff Size: Large)   Pulse 78   Temp 98.1 F (36.7 C) (Temporal)   Ht 5\' 5"  (1.651 m)   Wt 218 lb 3.2 oz (99 kg)    SpO2 97%   BMI 36.31 kg/m   BP Readings from Last 3 Encounters:  04/08/23 126/86  03/10/23 134/88  03/08/23 (!) 142/92      Physical Exam Constitutional:      General: She is not in acute distress.    Appearance: Normal appearance. She is obese. She is not ill-appearing.  HENT:     Head: Normocephalic.     Right Ear: Tympanic membrane normal.     Left Ear: Tympanic membrane normal.     Nose: Nose normal.     Mouth/Throat:     Mouth: Mucous membranes are moist.  Eyes:     Extraocular Movements: Extraocular movements intact.     Pupils: Pupils are equal, round, and reactive to light.  Cardiovascular:     Rate and Rhythm: Normal rate and regular rhythm.  Pulmonary:     Effort: Pulmonary effort is normal.     Breath sounds: Normal breath sounds.  Abdominal:     General: Abdomen is flat. Bowel sounds are normal.     Palpations: Abdomen is soft.     Tenderness: There is no guarding or rebound.  Musculoskeletal:        General: Normal range of motion.     Cervical back: Normal range of motion.  Skin:    General: Skin is warm.     Capillary Refill: Capillary refill takes less than 2 seconds.  Neurological:     General: No focal deficit present.     Mental Status: She is alert.  Psychiatric:        Mood and Affect: Mood normal.        Behavior: Behavior normal.        Thought Content: Thought content normal.        Judgment: Judgment normal.          Assessment & Plan:  Postmenopausal -     DG Bone Density; Future  Prediabetes -     Hemoglobin A1c  Hypertension, benign Assessment & Plan: Stable continue hydrochlorothiazide   Orders: -     Lipid panel -     Microalbumin / creatinine urine ratio  Low serum vitamin B12 -     Vitamin B12  Dizziness Assessment & Plan: Currently improving Pt pending appt with neurology  Workup ent and cardiology negative   Hypertensive kidney disease with stage 3a chronic kidney disease (HCC)  Encounter for general  adult medical examination without abnormal findings Assessment & Plan: Patient Counseling(The following topics were reviewed):  Preventative care handout given  to pt  Health maintenance and immunizations reviewed. Please refer to Health maintenance section. Pt advised on safe sex, wearing seatbelts in car, and proper nutrition labwork ordered today for annual Dental health: Discussed importance of regular tooth brushing, flossing, and dental visits.    Screening for cardiovascular condition -     Lipid panel  Long-term current use of proton pump inhibitor therapy  Dry eyes Assessment & Plan: Cont f/u with ophthalmology    Chronic vaginitis Assessment & Plan: Cont hrt as guided by gynecology    Mixed hyperlipidemia Assessment & Plan: Pt advised to:  Work on low cholesterol diet and exercise as tolerated Lipid panel       Follow-up: Return in about 6 months (around 10/06/2023) for f/u blood pressure.   Mort Sawyers, FNP

## 2023-04-08 NOTE — Assessment & Plan Note (Signed)
 Pt advised to:  Work on low cholesterol diet and exercise as tolerated Lipid panel

## 2023-04-08 NOTE — Assessment & Plan Note (Signed)
 Currently improving Pt pending appt with neurology  Workup ent and cardiology negative

## 2023-04-08 NOTE — Assessment & Plan Note (Signed)
 Cont hrt as guided by gynecology

## 2023-04-09 ENCOUNTER — Encounter: Payer: Self-pay | Admitting: Family

## 2023-04-09 ENCOUNTER — Other Ambulatory Visit: Payer: Self-pay | Admitting: Family

## 2023-04-09 DIAGNOSIS — R809 Proteinuria, unspecified: Secondary | ICD-10-CM | POA: Insufficient documentation

## 2023-04-09 MED ORDER — LOSARTAN POTASSIUM 25 MG PO TABS: 25.0000 mg | ORAL_TABLET | Freq: Every day | ORAL | 3 refills | Status: DC

## 2023-04-14 ENCOUNTER — Ambulatory Visit: Payer: Medicare PPO | Admitting: Professional Counselor

## 2023-04-22 ENCOUNTER — Other Ambulatory Visit: Payer: Self-pay | Admitting: Family

## 2023-05-07 ENCOUNTER — Ambulatory Visit: Payer: Medicare PPO | Admitting: Diagnostic Neuroimaging

## 2023-05-07 ENCOUNTER — Encounter: Payer: Self-pay | Admitting: Diagnostic Neuroimaging

## 2023-05-07 VITALS — BP 124/78 | HR 72 | Ht 65.0 in | Wt 218.0 lb

## 2023-05-07 DIAGNOSIS — R42 Dizziness and giddiness: Secondary | ICD-10-CM

## 2023-05-07 NOTE — Patient Instructions (Addendum)
  INTERMITTENT DIZZINESS / PRESYNCOPE EVENTS  - likely cerebral hypoperfusion or low BP events vs peripheral vestibular origin; MRI and neuro exam are unremarkable  - check 30 day heart monitor, echocardiogram, carotid u/s  - Please maintain precautions. Do not participate in activities where a loss of awareness could harm you or someone else. Caution with swimming alone, tub bathing, hot tubs, driving, operating motorized vehicles (cars, ATVs, motocycles, etc), lawnmowers, power tools or firearms. Caution with standing at heights, such as rooftops, ladders or stairs. Avoid hot objects such as stoves, heaters, open fires. Wear a helmet when riding a bicycle, scooter, skateboard, etc. and avoid areas of traffic. Set your water heater to 120 degrees or less.
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
## 2023-05-11 ENCOUNTER — Ambulatory Visit: Admitting: Professional Counselor

## 2023-05-24 ENCOUNTER — Other Ambulatory Visit: Payer: Self-pay | Admitting: Family

## 2023-05-24 DIAGNOSIS — R55 Syncope and collapse: Secondary | ICD-10-CM | POA: Diagnosis not present

## 2023-05-24 DIAGNOSIS — R42 Dizziness and giddiness: Secondary | ICD-10-CM | POA: Diagnosis not present

## 2023-05-28 ENCOUNTER — Encounter: Payer: Self-pay | Admitting: Family

## 2023-05-28 DIAGNOSIS — I10 Essential (primary) hypertension: Secondary | ICD-10-CM

## 2023-06-03 ENCOUNTER — Encounter: Payer: Self-pay | Admitting: Family

## 2023-06-03 ENCOUNTER — Other Ambulatory Visit (INDEPENDENT_AMBULATORY_CARE_PROVIDER_SITE_OTHER)

## 2023-06-03 DIAGNOSIS — I10 Essential (primary) hypertension: Secondary | ICD-10-CM | POA: Diagnosis not present

## 2023-06-03 DIAGNOSIS — H6123 Impacted cerumen, bilateral: Secondary | ICD-10-CM | POA: Diagnosis not present

## 2023-06-03 DIAGNOSIS — R42 Dizziness and giddiness: Secondary | ICD-10-CM | POA: Diagnosis not present

## 2023-06-03 DIAGNOSIS — H903 Sensorineural hearing loss, bilateral: Secondary | ICD-10-CM | POA: Diagnosis not present

## 2023-06-03 LAB — MICROALBUMIN / CREATININE URINE RATIO
Creatinine,U: 91.8 mg/dL
Microalb Creat Ratio: UNDETERMINED mg/g (ref 0.0–30.0)
Microalb, Ur: <0.7 mg/dL

## 2023-06-08 ENCOUNTER — Ambulatory Visit: Payer: Medicare PPO | Admitting: Medical

## 2023-06-11 DIAGNOSIS — H903 Sensorineural hearing loss, bilateral: Secondary | ICD-10-CM | POA: Diagnosis not present

## 2023-06-18 ENCOUNTER — Ambulatory Visit (HOSPITAL_COMMUNITY): Attending: Cardiology

## 2023-06-18 DIAGNOSIS — R42 Dizziness and giddiness: Secondary | ICD-10-CM | POA: Insufficient documentation

## 2023-06-18 DIAGNOSIS — I517 Cardiomegaly: Secondary | ICD-10-CM | POA: Insufficient documentation

## 2023-06-18 DIAGNOSIS — I5030 Unspecified diastolic (congestive) heart failure: Secondary | ICD-10-CM | POA: Diagnosis not present

## 2023-06-18 LAB — ECHOCARDIOGRAM COMPLETE BUBBLE STUDY
Area-P 1/2: 2.55 cm2
S' Lateral: 2.8 cm

## 2023-06-22 ENCOUNTER — Ambulatory Visit: Payer: Self-pay | Admitting: Diagnostic Neuroimaging

## 2023-06-22 ENCOUNTER — Ambulatory Visit (INDEPENDENT_AMBULATORY_CARE_PROVIDER_SITE_OTHER): Admitting: Professional Counselor

## 2023-06-22 DIAGNOSIS — G4733 Obstructive sleep apnea (adult) (pediatric): Secondary | ICD-10-CM | POA: Diagnosis not present

## 2023-06-22 DIAGNOSIS — F4320 Adjustment disorder, unspecified: Secondary | ICD-10-CM

## 2023-06-22 DIAGNOSIS — R42 Dizziness and giddiness: Secondary | ICD-10-CM

## 2023-06-22 NOTE — Progress Notes (Signed)
  THERAPIST PROGRESS NOTE  Session Time: 10:02 AM - 10:56 AM   Participation Level: Active  Behavioral Response: Well Groomed, Alert, Anxious  Type of Therapy: Individual Therapy  Treatment Goals addressed: Active OP Depression  LTG: "To make some changes to my behavior that become a pattern with me that help me feel less stressed or less right a rat running around trying to get everything done."                Start:  03/17/23    Expected End:  03/15/24      STG: "To identify what I'm feeling and to speak about it or act on it at the very beginning instead of waiting until you get dumped on and explode."     STG: "To identify tasks that I don't want to do and get my husband to do them." To improve assertiveness skills over the next 90 days.       STG: [name] will earn to accept and adjust as she moves though stages of life   ProgressTowards Goals: Progressing  Interventions: CBT, Motivational Interviewing, Solution Focused, Assertiveness Training, and Supportive  Summary: Lisa Chambers is a 73 y.o. female who presents with a history of MDD. She appeared anxious but oriented x5. She reported some health issues that have impacted her attendance. She also noted a death in the family and helping take care of her grandchildren. [name] discussed the relationship with her daughter-in-law and inquired about ways to respond to her. She was receptive to communication skills. She also inquired about building new habits and expressed her struggles with perfectionism. She was receptive to tips and agreed with how her thinking about the future is also contributing to her feelings.   Therapist Response: Conducted session with [name]. Began session with check-in/update since previous session. Utilized empathetic and reflective listening. Used open-ended questions to facilitate discussion and summarized [name]'s thoughts/feelings. Discussed communication skills, boundaries, and explained DEAR MAN skill.  Explored ways to build habits, such as habit stacking and chunking. Explained duality (balancing acceptance and change) and discussed some affirmations to help reduce perfectionism. Highlighted a connection with [name]'s thoughts on wasting time with her struggling adjusting to the aging process. Scheduled additional appointment and concluded session.   Suicidal/Homicidal: No  Plan: Return again in 3 weeks.  Diagnosis: Adjustment disorder, unspecified type  Collaboration of Care: Medication Management AEB chart review  Patient/Guardian was advised Release of Information must be obtained prior to any record release in order to collaborate their care with an outside provider. Patient/Guardian was advised if they have not already done so to contact the registration department to sign all necessary forms in order for us  to release information regarding their care.   Consent: Patient/Guardian gives verbal consent for treatment and assignment of benefits for services provided during this visit. Patient/Guardian expressed understanding and agreed to proceed.   Len Quale, [facility] 06/22/2023

## 2023-06-25 ENCOUNTER — Ambulatory Visit: Attending: Diagnostic Neuroimaging

## 2023-06-25 DIAGNOSIS — R42 Dizziness and giddiness: Secondary | ICD-10-CM

## 2023-06-28 DIAGNOSIS — R55 Syncope and collapse: Secondary | ICD-10-CM

## 2023-06-28 DIAGNOSIS — R42 Dizziness and giddiness: Secondary | ICD-10-CM | POA: Diagnosis not present

## 2023-06-29 ENCOUNTER — Encounter: Payer: Self-pay | Admitting: Cardiovascular Disease

## 2023-07-01 DIAGNOSIS — H8112 Benign paroxysmal vertigo, left ear: Secondary | ICD-10-CM | POA: Diagnosis not present

## 2023-07-12 ENCOUNTER — Ambulatory Visit (INDEPENDENT_AMBULATORY_CARE_PROVIDER_SITE_OTHER): Admitting: Professional Counselor

## 2023-07-12 DIAGNOSIS — F4320 Adjustment disorder, unspecified: Secondary | ICD-10-CM

## 2023-07-12 NOTE — Progress Notes (Signed)
  THERAPIST PROGRESS NOTE  Session Time: 11:05 AM - 11:50 AM  Participation Level: Active  Behavioral Response: Well Groomed, Alert, Anxious  Type of Therapy: Individual Therapy  Treatment Goals addressed: Active OP Depression  LTG: "To make some changes to my behavior that become a pattern with me that help me feel less stressed or less right a rat running around trying to get everything done."                Start:  03/17/23    Expected End:  03/15/24      STG: "To identify what I'm feeling and to speak about it or act on it at the very beginning instead of waiting until you get dumped on and explode."     STG: "To identify tasks that I don't want to do and get my husband to do them." To improve assertiveness skills over the next 90 days.       STG: Sumayyah will earn to accept and adjust as she moves though stages of life   ProgressTowards Goals: Progressing  Interventions: CBT, Motivational Interviewing, and Supportive  Summary: KESSIE CROSTON is a 73 y.o. female who presents with a history of MDD. She appeared alert and oriented x5. She shared a quote she saw online that she felt spoke to her. It was about growing up to fast and struggling with rest and finding worth in their contribution. 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 She engaged in discussion about potential ADHD symptoms and noted how she used to be medicated for it. Barri plans to access her records and discuss this further with the medication provider. She was receptive to restructuring thoughts around rest and worth.   Therapist Response: Conducted session with [name]. Began session with check-in/update since previous session. Utilized empathetic and reflective listening. Explored contributing factors to Deena's struggle with rest/relaxation. Identified thinking patterns that contribute to negative emotions. Reviewed  chart to identify testing for ADHD. Shared my impression of Naveah in session, including restless/fidgety behavior. Discussed ways to restructure negative thinking to reduce negative emotions. Scheduled additional appointment and concluded session.   Suicidal/Homicidal: No  Plan: Return again in 6 weeks.  Diagnosis: Adjustment disorder, unspecified type  Collaboration of Care: Medication Management AEB chart review  Patient/Guardian was advised Release of Information must be obtained prior to any record release in order to collaborate their care with an outside provider. Patient/Guardian was advised if they have not already done so to contact the registration department to sign all necessary forms in order for us  to release information regarding their care.   Consent: Patient/Guardian gives verbal consent for treatment and assignment of benefits for services provided during this visit. Patient/Guardian expressed understanding and agreed to proceed.   Len Quale, [facility] 07/12/2023

## 2023-07-14 ENCOUNTER — Encounter: Payer: Self-pay | Admitting: Medical

## 2023-07-14 ENCOUNTER — Ambulatory Visit: Attending: Medical | Admitting: Medical

## 2023-07-14 VITALS — BP 115/61 | HR 68 | Ht 65.0 in | Wt 220.8 lb

## 2023-07-14 DIAGNOSIS — I10 Essential (primary) hypertension: Secondary | ICD-10-CM | POA: Diagnosis not present

## 2023-07-14 DIAGNOSIS — I2510 Atherosclerotic heart disease of native coronary artery without angina pectoris: Secondary | ICD-10-CM

## 2023-07-14 DIAGNOSIS — E782 Mixed hyperlipidemia: Secondary | ICD-10-CM

## 2023-07-14 DIAGNOSIS — R42 Dizziness and giddiness: Secondary | ICD-10-CM | POA: Diagnosis not present

## 2023-07-14 NOTE — Progress Notes (Signed)
 Cardiology Office Note   Date:  07/28/2023  ID:  Lisa Chambers, DOB Feb 11, 1950, MRN [id number] PCP: Felicita Horns, FNP  [facility] Providers Cardiologist:  Antionette Kirks, MD   History of Present Illness Lisa Chambers is a 73 y.o. female with a hx of hypertension, obesity, nonobstructive CAD, and hyperlipidemia who is being seen for follow-up.   Her brother had CABG and PAD.  She was seen in 2021 for intermittent atypical chest pain.  Lexiscan  Myoview  showed no evidence of ischemia within normal EF.  Cardiac CTA in June 2024 for exertional dyspnea showed coronary calcium score of 19 with minimal nonobstructive CAD.   Patient was last seen in June 2024 and was overall stable from a cardiac perspective.   Seen in the ER 03/03/23 for dizziness via EMS. She was fixing hot tea and felt dizzy and walked a short distance to the couch. She sat down and felt she couldn't hold her head up. BP 147/89, pulse 67bpm, afebrile. EKG with NSR and nonspecific t wave changes. Labs were unremarkable. MRI brain unremarkable.  She was seen in the office 03/08/23 and orthostatics were negative. Plan was to see ENT before pursuing cardiac work-up.   Today,  the patient reports dizziness has overall improved. She still feels dizzy once monthly. Neurology ordered an echo bubble study and a heart monitor which were unremarkable. Carotid US  has been ordered as well. She denies chest pain or SOB.   Studies Reviewed      Heart monitor 06/2023 30-day event monitor: Normal sinus rhythm with no evidence of atrial fibrillation.  Average heart rate was 72 bpm. 5 beat run of nonsustained ventricular tachycardia. Rare PVCs with a burden of less than 1%. Rare PACs with a burden of less than 1%. 6 triggered events correlated with sinus rhythm.  Echo bubble study 06/2023  1. Left ventricular ejection fraction, by estimation, is 60 to 65%. The  left ventricle has normal function. The left ventricle has no regional   wall motion abnormalities. There is mild left ventricular hypertrophy.  Left ventricular diastolic parameters  are consistent with Grade I diastolic dysfunction (impaired relaxation).   2. Right ventricular systolic function is normal. The right ventricular  size is normal.   3. The mitral valve is normal in structure. No evidence of mitral valve  regurgitation. No evidence of mitral stenosis.   4. The aortic valve is tricuspid. Aortic valve regurgitation is trivial.  No aortic stenosis is present.   5. The inferior vena cava is normal in size with greater than 50%  respiratory variability, suggesting right atrial pressure of 3 mmHg.   6. Agitated saline contrast bubble study was negative, with no evidence  of any interatrial shunt.   Comparison(s): No prior Echocardiogram.   Cardiac CTA 07/2022  IMPRESSION: 1. Coronary calcium score of 19.3. This was 55th percentile for age and sex matched control.   2. Normal coronary origin with right dominance.   3. Minimal proximal RCA stenosis (<25%).   4. CAD-RADS 1. Minimal non-obstructive CAD (0-24%). Consider preventive therapy and risk factor modification.   Electronically Signed: By: Constancia Delton M.D. On: 07/23/2022 16:09     Myoview  lexiscan  01/2020 Narrative & Impression  Pharmacological myocardial perfusion imaging study with no significant  ischemia Normal wall motion, EF estimated at 70%, GI uptake artifact noted No EKG changes concerning for ischemia at peak stress or in recovery. CT attenuation correction images: Minimal aortic atherosclerosis, no significant coronary calcification Low risk scan  Physical Exam VS:  BP 115/61 (BP Location: Left Arm, Patient Position: Sitting, Cuff Size: Normal)   Pulse 68   Ht 5' 5 (1.651 m)   Wt 220 lb 12.8 oz (100.2 kg)   SpO2 97%   BMI 36.74 kg/m    Wt Readings from Last 3 Encounters:  07/14/23 220 lb 12.8 oz (100.2 kg)  05/07/23 218 lb (98.9 kg)  04/08/23 218 lb  3.2 oz (99 kg)    GEN: Well nourished, well developed in no acute distress NECK: No JVD; No carotid bruits CARDIAC: RRR, no murmurs, rubs, gallops RESPIRATORY:  Clear to auscultation without rales, wheezing or rhonchi  ABDOMEN: Soft, non-tender, non-distended EXTREMITIES:  No edema; No deformity   ASSESSMENT AND PLAN  Dizziness Patient reports improved dizziness. Prior ER work-up was unremarkable. Heart monitor and echo bubble study were unremarkable. She will also get carotid US . We will continue to follow.   HTN BP is normal. Continue Losartan  and hydrochlorothiazide.   HLD LDL 94.she is not on cholesterol medication at baseline.   Nonobstructive CAD The patient denies anginal symptoms. Cardiac CTA in 2024 showed nonobstructive CAD. No further work-up at this time.        Dispo: Follow-up in 3 months  Signed, Delonta Yohannes Rebekah Canada, PA-C

## 2023-07-14 NOTE — Patient Instructions (Signed)
 Medication Instructions:   Your physician recommends that you continue on your current medications as directed. Please refer to the Current Medication list given to you today.  *If you need a refill on your cardiac medications before your next appointment, please call your pharmacy*  Lab Work:  No labs ordered today   If you have labs (blood work) drawn today and your tests are completely normal, you will receive your results only by: MyChart Message (if you have MyChart) OR A paper copy in the mail If you have any lab test that is abnormal or we need to change your treatment, we will call you to review the results.  Testing/Procedures:  No test ordered today   Follow-Up: At [facility], you and your health needs are our priority.  As part of our continuing mission to provide you with exceptional heart care, our providers are all part of one team.  This team includes your primary Cardiologist (physician) and Advanced Practice Providers or APPs (Physician Assistants and Nurse Practitioners) who all work together to provide you with the care you need, when you need it.  Your next appointment:   1 year(s)  Provider:   You may see Antionette Kirks, MD or one of the following Advanced Practice Providers on your designated Care Team:    Cadence [place], PA-C

## 2023-07-21 ENCOUNTER — Encounter: Payer: Self-pay | Admitting: Psychiatry

## 2023-07-21 ENCOUNTER — Ambulatory Visit (INDEPENDENT_AMBULATORY_CARE_PROVIDER_SITE_OTHER): Payer: Self-pay | Admitting: Psychiatry

## 2023-07-21 VITALS — BP 118/80 | HR 89 | Temp 98.5°F | Ht 65.0 in | Wt 219.8 lb

## 2023-07-21 DIAGNOSIS — F330 Major depressive disorder, recurrent, mild: Secondary | ICD-10-CM | POA: Diagnosis not present

## 2023-07-21 DIAGNOSIS — F411 Generalized anxiety disorder: Secondary | ICD-10-CM | POA: Diagnosis not present

## 2023-07-21 DIAGNOSIS — F5101 Primary insomnia: Secondary | ICD-10-CM | POA: Diagnosis not present

## 2023-07-21 DIAGNOSIS — R42 Dizziness and giddiness: Secondary | ICD-10-CM | POA: Diagnosis not present

## 2023-07-21 DIAGNOSIS — H903 Sensorineural hearing loss, bilateral: Secondary | ICD-10-CM | POA: Diagnosis not present

## 2023-07-21 MED ORDER — BUPROPION HCL ER (XL) 150 MG PO TB24: 150.0000 mg | ORAL_TABLET | Freq: Every morning | ORAL | 0 refills | Status: DC

## 2023-07-21 MED ORDER — VENLAFAXINE HCL ER 37.5 MG PO CP24: 37.5000 mg | ORAL_CAPSULE | Freq: Every day | ORAL | 0 refills | Status: DC

## 2023-07-21 NOTE — Progress Notes (Signed)
 [facility] MD OP Progress Note  07/21/2023 9:53 AM ANEITA Chambers  MRN:  [id number]  Chief Complaint:  Chief Complaint  Patient presents with   Follow-up   Depression   Anxiety   Medication Refill   Discussed the use of AI scribe software for clinical note transcription with the patient, who gave verbal consent to proceed.  History of Present Illness Lisa Chambers is a 73 year old Caucasian female, married, retired, lives in [place], has a history of MDD, primary insomnia, obstructive sleep apnea on CPAP, asthma, hypertension, history of thyroid  nodules, GERD was evaluated in office today.  Since January, she has experienced dizzy spells primarily when standing still, accompanied by nausea and a sensation of the floor tilting. The first significant episode occurred on January 22, leading to an emergency room visit due to concerns of a stroke. During this episode, she experienced high and low blood pressure fluctuations, inability to speak or move her arms, and a sensation of her head feeling like a balloon. An MRI ruled out stroke or tumor.  She has undergone extensive cardiac evaluation, including wearing a heart monitor for a month and an ultrasound bubble test, all of which returned normal results. Despite experiencing dizziness during the monitoring period, no cardiac abnormalities were detected.  Consultation with an ENT specialist initially did not suspect an inner ear issue. However, a subsequent VNG test reproduced her dizziness, which was more pronounced on the left side. She is awaiting further evaluation from her ENT.  She has been prescribed 2 mg of Valium to take during episodes, which may help stabilize her equilibrium after about 30 minutes. She typically rests for an hour until symptoms subside. The episodes have decreased in frequency, now occurring once or twice a month. She monitors her blood pressure, which has stabilized since the initial episode.  She has been under  significant stress due to identity theft issues, which began in December and involved numerous fraudulent activities with her credit cards and bank accounts. This stress coincided with the onset of her symptoms. She is currently seeing her therapist to help manage stress and has been working on setting boundaries and reducing her responsibilities at home.  She does have episodes of sadness, low motivation, low energy, concentration problems as well as anxiety symptoms like feeling nervous, inability to sit still, trouble relaxing and so on.  She is currently compliant on the Wellbutrin  and venlafaxine .  Agreeable to dosage adjustment to address her current mood symptoms.  No changes in memory, thoughts of self-harm, or significant changes in sleep patterns. She uses a CPAP machine regularly and sleeps well.  She is also compliant on Sonata  as needed for sleep which has been beneficial.      Visit Diagnosis:    ICD-10-CM   1. MDD (major depressive disorder), recurrent episode, mild (HCC)  F33.0 venlafaxine  XR (EFFEXOR  XR) 37.5 MG 24 hr capsule    buPROPion  (WELLBUTRIN  XL) 150 MG 24 hr tablet    2. GAD (generalized anxiety disorder)  F41.1     3. Primary insomnia  F51.01       Past Psychiatric History: Reviewed past psychiatric history from progress note on 12/13/2019.  Past trials of Ambien, Abilify, Effexor , Adderall, Wellbutrin , Viibryd.  Previous neuropsychological testing per Dr.Zusman dated 04/10/2020, results of cognitive evaluations largely within normal limits with mild frontal subcortical dysfunction.  Testing did not warrant a diagnosis of neurodevelopmental or neurocognitive disorder.  Past Medical History:  Past Medical History:  Diagnosis  Date   Allergic rhinitis    Anemia    Asthma    Cancer (HCC)    skin   Chest pain    a. 01/2020 MV: EF 70%, no isch/infarct. CT attenuation correction images w/ minimal Ao athersclerosis, no significant cor Ca2+.   Depression    GERD  (gastroesophageal reflux disease)    Hearing loss    HTN (hypertension)    Hx of migraine headaches    Multinodular goiter    OSA on CPAP    Osteoarthritis    Thyroid  disease     Past Surgical History:  Procedure Laterality Date   BREAST BIOPSY Right 2021   fibroadenoma, benign   CHOLECYSTECTOMY     COLONOSCOPY WITH PROPOFOL  N/A 10/20/2022   Procedure: COLONOSCOPY WITH PROPOFOL ;  Surgeon: Toledo, Alphonsus Jeans, MD;  Location: [facility] ENDOSCOPY;  Service: Gastroenterology;  Laterality: N/A;   HEMOSTASIS CLIP PLACEMENT  10/20/2022   Procedure: HEMOSTASIS CLIP PLACEMENT;  Surgeon: Cassie Click, MD;  Location: [facility] ENDOSCOPY;  Service: Gastroenterology;;   NASAL SINUS SURGERY  1996, 2007   POLYPECTOMY  10/20/2022   Procedure: POLYPECTOMY;  Surgeon: Corky Diener, Alphonsus Jeans, MD;  Location: [facility] ENDOSCOPY;  Service: Gastroenterology;;   REPLACEMENT TOTAL KNEE Bilateral 2017   TUBAL LIGATION  1994    Family Psychiatric History: I have reviewed family psychiatric history from progress note on 12/13/2019.  Family History:  Family History  Problem Relation Age of Onset   Breast cancer Mother    Lung cancer Father    Pneumonia Father    Alcohol abuse Brother    Diabetes Brother    Heart attack Brother    COPD Brother    Insomnia Brother    Colon cancer Maternal Grandmother     Social History: I have reviewed social history from progress note on 12/13/2019. Social History   Socioeconomic History   Marital status: Married    Spouse name: Sylvester Evert   Number of children: 2   Years of education: Not on file   Highest education level: Bachelor's degree (e.g., BA, AB, BS)  Occupational History    Comment: retired    Associate Professor: retired  Tobacco Use   Smoking status: Never   Smokeless tobacco: Never  Vaping Use   Vaping status: Never Used  Substance and Sexual Activity   Alcohol use: No   Drug use: No   Sexual activity: Not Currently  Other Topics Concern   Not on file  Social History  Narrative   Lives with husband   Caffeine- diet coke 2 daily   Social Drivers of Health   Financial Resource Strain: Low Risk  (03/09/2023)   Overall Financial Resource Strain (CARDIA)    Difficulty of Paying Living Expenses: Not hard at all  Food Insecurity: No Food Insecurity (03/09/2023)   Hunger Vital Sign    Worried About Running Out of Food in the Last Year: Never true    Ran Out of Food in the Last Year: Never true  Transportation Needs: No Transportation Needs (03/09/2023)   PRAPARE - Administrator, Civil Service (Medical): No    Lack of Transportation (Non-Medical): No  Physical Activity: Insufficiently Active (03/09/2023)   Exercise Vital Sign    Days of Exercise per Week: 3 days    Minutes of Exercise per Session: 20 min  Stress: Stress Concern Present (03/09/2023)   [employer] of Occupational Health - Occupational Stress Questionnaire    Feeling of Stress : To some extent  Social Connections: Socially Integrated (03/09/2023)   Social Connection and Isolation Panel [NHANES]    Frequency of Communication with Friends and Family: More than three times a week    Frequency of Social Gatherings with Friends and Family: Twice a week    Attends Religious Services: More than 4 times per year    Active Member of [employer] or Organizations: Yes    Attends Engineer, structural: More than 4 times per year    Marital Status: Married    Allergies:  Allergies  Allergen Reactions   Aspirin Hives and Swelling    Lips only.  Lips only.  Lips only.   Sulfa Antibiotics Hives   Erythromycin Other (See Comments)    GI upset  Other reaction(s): Other (See Comments)  GI upset  GI upset  GI upset  GI upset   Levofloxacin Other (See Comments)    Joint pain    Tetracycline Other (See Comments)    GI upset  Other reaction(s): Other (See Comments)  GI upset  GI upset  GI upset  GI upset    Metabolic Disorder Labs: Lab Results  Component Value Date    HGBA1C 5.8 04/08/2023   No results found for: PROLACTIN Lab Results  Component Value Date   CHOL 170 04/08/2023   TRIG 115.0 04/08/2023   HDL 53.30 04/08/2023   CHOLHDL 3 04/08/2023   VLDL 23.0 04/08/2023   LDLCALC 94 04/08/2023   LDLCALC 86 09/01/2022   Lab Results  Component Value Date   TSH 3.13 09/01/2022    Therapeutic Level Labs: No results found for: LITHIUM No results found for: VALPROATE No results found for: CBMZ  Current Medications: Current Outpatient Medications  Medication Sig Dispense Refill   albuterol (VENTOLIN HFA) 108 (90 Base) MCG/ACT inhaler Inhale into the lungs.     buPROPion  (WELLBUTRIN  XL) 150 MG 24 hr tablet Take 1 tablet (150 mg total) by mouth in the morning. Stop 300 mg tablet 90 tablet 0   Cholecalciferol 25 MCG (1000 UT) capsule Take by mouth daily.     clobetasol  cream (TEMOVATE ) 0.05 % APPLY 1 APPLICATION TOPICALLY 2 (TWO) TIMES DAILY. 30 g 0   cycloSPORINE (RESTASIS) 0.05 % ophthalmic emulsion      estradiol  (VIVELLE -DOT) 0.025 MG/24HR Place 1 patch onto the skin 2 (two) times a week. 8 patch 12   Fexofenadine HCl (ALLEGRA PO) Take by mouth as needed.     fluticasone (FLONASE) 50 MCG/ACT nasal spray 2 sprays daily.      Fluticasone-Salmeterol (ADVAIR HFA IN) Inhale 1 puff into the lungs 2 (two) times daily.     hydrochlorothiazide (HYDRODIURIL) 25 MG tablet TAKE 1 TABLET BY MOUTH ONCE DAILY 90 tablet 2   hydrOXYzine  (ATARAX ) 10 MG tablet Take 1-2 tablets (10-20 mg total) by mouth at bedtime as needed. For anxiety, sleep 180 tablet 1   L-Lysine 1000 MG TABS Take 1 tablet by mouth daily.     losartan  (COZAAR ) 25 MG tablet Take 1 tablet (25 mg total) by mouth daily. 90 tablet 3   montelukast (SINGULAIR) 10 MG tablet TAKE 1 TABLET BY MOUTH DAILY 90 tablet 1   Omega-3 Fatty Acids (FISH OIL) 1200 MG CAPS Take by mouth daily in the afternoon.     omeprazole  (PRILOSEC) 40 MG capsule TAKE 1 CAPSULE (40 MG TOTAL) BY MOUTH DAILY. 30 capsule 3    Probiotic Product (PROBIOTIC PO) Take by mouth daily.     progesterone (PROMETRIUM) 100 MG capsule TAKE 1  CAPSULE (100 MG TOTAL) BY MOUTH ONCE DAILY 90 capsule 1   venlafaxine  XR (EFFEXOR  XR) 37.5 MG 24 hr capsule Take 1 capsule (37.5 mg total) by mouth daily with breakfast. Take daily with 75 mg 90 capsule 0   venlafaxine  XR (EFFEXOR -XR) 75 MG 24 hr capsule Take 1 capsule (75 mg total) by mouth 2 (two) times daily with a meal. 180 capsule 3   zaleplon  (SONATA ) 10 MG capsule TAKE 1 CAPSULE BY MOUTH AT BEDTIME AS NEEDED FOR SLEEP. 30 capsule 5   No current facility-administered medications for this visit.     Musculoskeletal: Strength & Muscle Tone: within normal limits Gait & Station: normal Patient leans: N/A  Psychiatric Specialty Exam: Review of Systems  Psychiatric/Behavioral:  Positive for decreased concentration and dysphoric mood. The patient is nervous/anxious.     Blood pressure 118/80, pulse 89, temperature 98.5 F (36.9 C), temperature source Temporal, height 5' 5 (1.651 m), weight 219 lb 12.8 oz (99.7 kg), SpO2 97%.Body mass index is 36.58 kg/m.  General Appearance: Casual  Eye Contact:  Fair  Speech:  Clear and Coherent  Volume:  Normal  Mood:  Anxious and Depressed  Affect:  Appropriate  Thought Process:  Goal Directed and Descriptions of Associations: Intact  Orientation:  Full (Time, Place, and Person)  Thought Content: Logical   Suicidal Thoughts:  No  Homicidal Thoughts:  No  Memory:  Immediate;   Fair Recent;   Fair Remote;   Fair  Judgement:  Fair  Insight:  Fair  Psychomotor Activity:  Normal  Concentration:  Concentration: Fair and Attention Span: Fair  Recall:  [employer] of Knowledge: Fair  Language: Fair  Akathisia:  No  Handed:  Right  AIMS (if indicated): not done  Assets:  Manufacturing systems engineer Desire for Improvement Housing Social Support Transportation  ADL's:  Intact  Cognition: WNL  Sleep:  Fair   Screenings: Tourist information centre manager Visit from 09/29/2021 in [facility] Office Visit from 10/01/2020 in [facility]  AIMS Total Score 0 0      GAD-7    Flowsheet Row Office Visit from 07/21/2023 in [facility] Office Visit from 04/08/2023 in [facility] Office Visit from 03/10/2023 in [facility] [place] [facility] from 02/15/2023 in [facility] Office Visit from 02/12/2023 in [facility] [employer] [facility]  Total GAD-7 Score 10 0 1 9 1       Mini-Mental    Flowsheet Row Office Visit from 05/22/2020 in [facility]  Total Score (max 30 points ) 29      PHQ2-9    Flowsheet Row Office Visit from 07/21/2023 in [facility] Office Visit from 04/08/2023 in [facility] Office Visit from 03/10/2023 in [facility] [facility] [place] [facility] from 02/15/2023 in [facility] Office Visit from 02/12/2023 in [facility] Total Score 3 0 0 1 0  PHQ-9 Total Score 8 0 0 7 2      Flowsheet Row Office Visit from 07/21/2023 in [facility] ED from 03/03/2023 in [facility] Office Visit from 01/20/2023 in [facility]  C-SSRS RISK CATEGORY No  Risk No Risk No Risk       Assessment and Plan:Earlie K Konczal is a 73 year old Caucasian female with history of depression, anxiety, recent medical issues including dizziness with multiple workup currently ongoing, presented for a follow-up appointment, discussed assessment and plan as noted below.  Assessment & Plan  Major depressive  disorder-unstable Generalized anxiety disorder-unstable Ongoing management of depression and anxiety. Current medications include Wellbutrin  and venlafaxine . Stress management and mindfulness practices are being encouraged. Therapy sessions with Ms. Josiephine Nightingale are ongoing and beneficial. Consideration of medication adjustments pending further medical clearance. - Reduce Wellbutrin  to 150 mg daily. - Increase Venlafaxine  by adding 37.5 mg to the current 75 mg twice daily regimen. - Continue therapy sessions with Ms. Josiephine Nightingale. - Practice mindfulness and stress management techniques.  Insomnia-stable Currently sleep is good.  She also has a history of obstructive sleep apnea managed with CPAP. Continue Sonata  10 mg daily at bedtime. Continue CPAP use and annual pulmonology follow-up. Reviewed [place] PMP AWARxE  Dizziness and disequilibrium Intermittent dizziness and disequilibrium since January, occurring once or twice a month, with episodes triggered by standing still. Initial workup including MRI and cardiology evaluation showed no stroke, tumor, or cardiac issues. ENT evaluation and VNG testing are being reviewed for possible inner ear disease. Stress may exacerbate symptoms but is not the primary cause. Awaiting results from VNG and carotid doppler tests. Valium 2 mg prescribed for episodes, though its efficacy is uncertain. - Await results from VNG and carotid doppler tests. - Continue Valium 2 mg as needed for dizziness, but avoid frequent use due to habit-forming potentia  Follow-up Follow-up in clinic in 7 weeks or sooner in person.   Collaboration of Care: Collaboration of Care: Referral or follow-up with counselor/therapist AEB patient and arranged to continue CBT with Ms. Deetta Farrow.  I have reviewed notes per Ms. Deetta Farrow dated 07/12/2023-currently undergoing CBT.  Patient/Guardian was advised Release of Information must be obtained prior to any record release in order to  collaborate their care with an outside provider. Patient/Guardian was advised if they have not already done so to contact the registration department to sign all necessary forms in order for us  to release information regarding their care.   Consent: Patient/Guardian gives verbal consent for treatment and assignment of benefits for services provided during this visit. Patient/Guardian expressed understanding and agreed to proceed.   This note was generated in part or whole with voice recognition software. Voice recognition is usually quite accurate but there are transcription errors that can and very often do occur. I apologize for any typographical errors that were not detected and corrected.    Alaynna Kerwood, MD 07/21/2023, 12:58 PM

## 2023-07-29 DIAGNOSIS — M542 Cervicalgia: Secondary | ICD-10-CM | POA: Diagnosis not present

## 2023-08-09 ENCOUNTER — Ambulatory Visit (HOSPITAL_COMMUNITY)
Admission: RE | Admit: 2023-08-09 | Discharge: 2023-08-09 | Disposition: A | Discharge status: Home / Self Care | Source: Ambulatory Visit | Attending: Diagnostic Neuroimaging | Admitting: Diagnostic Neuroimaging

## 2023-08-09 DIAGNOSIS — R42 Dizziness and giddiness: Secondary | ICD-10-CM

## 2023-08-12 ENCOUNTER — Encounter: Payer: Self-pay | Admitting: Cardiovascular Disease

## 2023-08-12 DIAGNOSIS — M542 Cervicalgia: Secondary | ICD-10-CM | POA: Diagnosis not present

## 2023-08-16 ENCOUNTER — Ambulatory Visit

## 2023-08-18 ENCOUNTER — Ambulatory Visit

## 2023-08-19 ENCOUNTER — Encounter: Payer: Self-pay | Admitting: Family

## 2023-08-19 DIAGNOSIS — R42 Dizziness and giddiness: Secondary | ICD-10-CM

## 2023-08-19 DIAGNOSIS — I6523 Occlusion and stenosis of bilateral carotid arteries: Secondary | ICD-10-CM

## 2023-08-20 DIAGNOSIS — M47812 Spondylosis without myelopathy or radiculopathy, cervical region: Secondary | ICD-10-CM | POA: Diagnosis not present

## 2023-08-23 ENCOUNTER — Ambulatory Visit (INDEPENDENT_AMBULATORY_CARE_PROVIDER_SITE_OTHER): Admitting: Professional Counselor

## 2023-08-23 ENCOUNTER — Ambulatory Visit

## 2023-08-23 DIAGNOSIS — F330 Major depressive disorder, recurrent, mild: Secondary | ICD-10-CM

## 2023-08-23 DIAGNOSIS — F411 Generalized anxiety disorder: Secondary | ICD-10-CM | POA: Diagnosis not present

## 2023-08-23 NOTE — Progress Notes (Signed)
  THERAPIST PROGRESS NOTE  Session Time: 9:03 AM - 9:53 AM  Participation Level: Active  Behavioral Response: Well Groomed, Alert, Anxious  Type of Therapy: Individual Therapy  Treatment Goals addressed: Active OP Depression  LTG: To make some changes to my behavior that become a pattern with me that help me feel less stressed or less right a rat running around trying to get everything done.                Start:  03/17/23    Expected End:  03/15/24      STG: To identify what I'm feeling and to speak about it or act on it at the very beginning instead of waiting until you get dumped on and explode.     STG: To identify tasks that I don't want to do and get my husband to do them. To improve assertiveness skills over the next 90 days.       STG: Lisa Chambers will earn to accept and adjust as she moves though stages of life   ProgressTowards Goals: Progressing  Interventions: CBT, Motivational Interviewing, and Supportive  Summary: Lisa Chambers is a 73 y.o. female who presents with a history of adjustment disorder, anxiety, and depression. She appeared alert and oriented x5. She discussed ongoing concerns with dizzy spells and frustration that medical providers have been unable to determine a cause. She noted stress/anxiety or even excitement may be a contributing factor. Lisa Chambers was receptive to coping skills and expressed understanding about the need to practice consistently. She explored family dynamics and engaged in writing exercise. She will try to focus on things within her control.   Therapist Response: Conducted session with Lisa Chambers. Began session with check-in/update since previous session. Utilized empathetic and reflective listening. Reviewed coping skills for anxiety/grounding. Used open-ended questions to facilitate discussion and summarized thoughts/feelings. Engaged in writing exercise - donut/circles of control. Encouraged Lisa Chambers to focus on what's within her control. Scheduled  additional appointment and concluded session.   Suicidal/Homicidal: No  Plan: Return again in 3 weeks.  Diagnosis: GAD (generalized anxiety disorder)  MDD (major depressive disorder), recurrent episode, mild (HCC)  Collaboration of Care: Medication Management AEB chart review   Patient/Guardian was advised Release of Information must be obtained prior to any record release in order to collaborate their care with an outside provider. Patient/Guardian was advised if they have not already done so to contact the registration department to sign all necessary forms in order for us  to release information regarding their care.   Consent: Patient/Guardian gives verbal consent for treatment and assignment of benefits for services provided during this visit. Patient/Guardian expressed understanding and agreed to proceed.   Lisa Chambers, [facility] 08/23/2023

## 2023-08-23 NOTE — Addendum Note (Signed)
 Addended by: CORWIN ANTU on: 08/23/2023 09:01 PM   Modules accepted: Orders

## 2023-08-25 ENCOUNTER — Ambulatory Visit

## 2023-08-26 ENCOUNTER — Encounter: Payer: Self-pay | Admitting: Family Medicine

## 2023-08-26 DIAGNOSIS — Z79890 Hormone replacement therapy (postmenopausal): Secondary | ICD-10-CM

## 2023-08-30 ENCOUNTER — Ambulatory Visit

## 2023-08-30 MED ORDER — ESTRADIOL 0.025 MG/24HR TD PTTW: 1.0000 | MEDICATED_PATCH | TRANSDERMAL | 12 refills | Status: DC

## 2023-08-30 MED ORDER — MEDROXYPROGESTERONE ACETATE 5 MG PO TABS: 5.0000 mg | ORAL_TABLET | Freq: Every day | ORAL | 12 refills | Status: DC

## 2023-09-02 ENCOUNTER — Ambulatory Visit

## 2023-09-06 ENCOUNTER — Ambulatory Visit

## 2023-09-07 DIAGNOSIS — M47812 Spondylosis without myelopathy or radiculopathy, cervical region: Secondary | ICD-10-CM | POA: Diagnosis not present

## 2023-09-07 NOTE — Telephone Encounter (Signed)
 Orders Placed This Encounter  Procedures   EEG adult   EDUARD FABIENE HANLON, MD 09/07/2023, 11:02 AM Certified in Neurology, Neurophysiology and Neuroimaging  [facility] [address], [address] [place], [place] [address] [phone]

## 2023-09-07 NOTE — Addendum Note (Signed)
 Addended by: MARGARET CARNE R on: 09/07/2023 11:03 AM   Modules accepted: Orders

## 2023-09-08 ENCOUNTER — Ambulatory Visit

## 2023-09-08 ENCOUNTER — Encounter: Payer: Self-pay | Admitting: Psychiatry

## 2023-09-08 ENCOUNTER — Ambulatory Visit (INDEPENDENT_AMBULATORY_CARE_PROVIDER_SITE_OTHER): Admitting: Psychiatry

## 2023-09-08 VITALS — BP 126/78 | HR 74 | Temp 98.2°F | Ht 65.0 in | Wt 219.2 lb

## 2023-09-08 DIAGNOSIS — F5101 Primary insomnia: Secondary | ICD-10-CM | POA: Diagnosis not present

## 2023-09-08 DIAGNOSIS — F411 Generalized anxiety disorder: Secondary | ICD-10-CM | POA: Diagnosis not present

## 2023-09-08 DIAGNOSIS — F3341 Major depressive disorder, recurrent, in partial remission: Secondary | ICD-10-CM

## 2023-09-08 NOTE — Progress Notes (Signed)
 [facility] MD OP Progress Note  09/08/2023 11:34 AM Lisa Chambers  MRN:  [id number]  Chief Complaint:  Chief Complaint  Patient presents with   Follow-up   Anxiety   Depression   Medication Refill   Discussed the use of AI scribe software for clinical note transcription with the patient, who gave verbal consent to proceed.  History of Present Illness Lisa Chambers is a 73 year old Caucasian female, married, retired, lives in [place], has a history of MDD, primary insomnia, obstructive sleep apnea on CPAP, asthma, hypertension, history of thyroid  nodules, GERD was evaluated in office today for a follow-up appointment.  Following a recent medication change--adding venlafaxine  37.5 mg and reducing Wellbutrin --she initially experienced a period of low mood, lack of happiness, and reluctance to smile. During the first few weeks after the change, which occurred approximately 6 to 8 weeks ago, she describes feeling depressed and blah. Over time, she notes that her mood has improved and feels more stable, stating she is doing better than before. Anxiety remains a significant concern and contributed to the recent medication adjustments. She reports that rejoining [employer] and attending in-person workshops has provided her with a sense of positivity and support for her well-being.As part of her efforts to improve health and mood, she limits herself to 1 Diet Coke per day and increases her water intake.  Her current medications include venlafaxine  37.5 mg, Wellbutrin  (reduced from previous regimen), Sonata  for sleep (taken nightly, though she occasionally misses doses without noticing a difference).  She is no longer taking hydroxyzine  since she felt drowsy when she took it previously.  She continues to experience episodes of dizziness, typically about once a month, with 1 instance of increased frequency in June. She describes the dizziness as a sensation of the floor shifting, sometimes  accompanied by nausea, but not associated with spinning. She reports that these episodes have not occurred during stressful periods, such as family visits or funerals, and do not happen while driving. She is concerned about the possibility of stroke and has pursued weight loss and further medical evaluation.Recent medical testing, including carotid ultrasound, inner ear testing, and MRI, has not identified a cause for her symptoms, and she is scheduled to see a vascular specialist for further evaluation.  She lives with her spouse.She plans to resume piano lessons. She reports living in a small house and prefers in-person over virtual appointments for privacy. She maintains a long-term friendship with a friend in [place]. She visits family in [place]  and helps care for grandchildren in [place].  She denies any suicidality, homicidality or perceptual disturbances.  Visit Diagnosis:    ICD-10-CM   1. Recurrent major depressive disorder, in partial remission (HCC)  F33.41     2. GAD (generalized anxiety disorder)  F41.1     3. Primary insomnia  F51.01       Past Psychiatric History: I have reviewed past psychiatric history from progress note on 12/13/2019.  Past trials of Ambien, Abilify, Effexor , Adderall, Wellbutrin , Viibryd, hydroxyzine .  Previous neuropsychological testing per Dr.Zusman dated 04/10/2020, results of cognitive evaluations largely within normal limits with mild frontal subcortical dysfunction.  Testing did not warrant a diagnosis of neurodevelopmental or neurocognitive disorder.  Past Medical History:  Past Medical History:  Diagnosis Date   Allergic rhinitis    Anemia    Asthma    Cancer (HCC)    skin   Chest pain    a. 01/2020 MV: EF 70%, no isch/infarct. CT attenuation correction images  w/ minimal Ao athersclerosis, no significant cor Ca2+.   Depression    GERD (gastroesophageal reflux disease)    Hearing loss    HTN (hypertension)    Hx of migraine headaches     Multinodular goiter    OSA on CPAP    Osteoarthritis    Thyroid  disease     Past Surgical History:  Procedure Laterality Date   BREAST BIOPSY Right 2021   fibroadenoma, benign   CHOLECYSTECTOMY     COLONOSCOPY WITH PROPOFOL  N/A 10/20/2022   Procedure: COLONOSCOPY WITH PROPOFOL ;  Surgeon: Toledo, Ladell POUR, MD;  Location: [facility] ENDOSCOPY;  Service: Gastroenterology;  Laterality: N/A;   HEMOSTASIS CLIP PLACEMENT  10/20/2022   Procedure: HEMOSTASIS CLIP PLACEMENT;  Surgeon: Aundria Ladell POUR, MD;  [facility]: [facility] ENDOSCOPY;  Service: Gastroenterology;;   NASAL SINUS SURGERY  1996, 2007   POLYPECTOMY  10/20/2022   Procedure: POLYPECTOMY;  Surgeon: Aundria, Ladell POUR, MD;  Location: [facility] ENDOSCOPY;  Service: Gastroenterology;;   REPLACEMENT TOTAL KNEE Bilateral 2017   TUBAL LIGATION  1994    Family Psychiatric History: I have reviewed family psychiatric history from progress note on 12/13/2019.  Family History:  Family History  Problem Relation Age of Onset   Breast cancer Mother    Lung cancer Father    Pneumonia Father    Alcohol abuse Brother    Diabetes Brother    Heart attack Brother    COPD Brother    Insomnia Brother    Colon cancer Maternal Grandmother     Social History: I have reviewed social history from progress note on 12/13/2019. Social History   Socioeconomic History   Marital status: Married    Spouse name: Francis   Number of children: 2   Years of education: Not on file   Highest education level: Bachelor's degree (e.g., BA, AB, BS)  Occupational History    Comment: retired    Associate Professor: retired  Tobacco Use   Smoking status: Never   Smokeless tobacco: Never  Vaping Use   Vaping status: Never Used  Substance and Sexual Activity   Alcohol use: No   Drug use: No   Sexual activity: Not Currently  Other Topics Concern   Not on file  Social History Narrative   Lives with husband   Caffeine- diet coke 2 daily   Social Drivers of Health   Financial  Resource Strain: Low Risk  (03/09/2023)   Overall Financial Resource Strain (CARDIA)    Difficulty of Paying Living Expenses: Not hard at all  Food Insecurity: No Food Insecurity (03/09/2023)   Hunger Vital Sign    Worried About Running Out of Food in the Last Year: Never true    Ran Out of Food in the Last Year: Never true  Transportation Needs: No Transportation Needs (03/09/2023)   PRAPARE - Administrator, Civil Service (Medical): No    Lack of Transportation (Non-Medical): No  Physical Activity: Insufficiently Active (03/09/2023)   Exercise Vital Sign    Days of Exercise per Week: 3 days    Minutes of Exercise per Session: 20 min  Stress: Stress Concern Present (03/09/2023)   [employer] of Occupational Health - Occupational Stress Questionnaire    Feeling of Stress : To some extent  Social Connections: Socially Integrated (03/09/2023)   Social Connection and Isolation Panel    Frequency of Communication with Friends and Family: More than three times a week    Frequency of Social Gatherings with Friends and Family: Twice  a week    Attends Religious Services: More than 4 times per year    Active Member of Clubs or Organizations: Yes    Attends Banker Meetings: More than 4 times per year    Marital Status: Married    Allergies:  Allergies  Allergen Reactions   Aspirin Hives and Swelling    Lips only.  Lips only.  Lips only.   Sulfa Antibiotics Hives   Erythromycin Other (See Comments)    GI upset  Other reaction(s): Other (See Comments)  GI upset  GI upset  GI upset  GI upset   Levofloxacin Other (See Comments)    Joint pain    Tetracycline Other (See Comments)    GI upset  Other reaction(s): Other (See Comments)  GI upset  GI upset  GI upset  GI upset    Metabolic Disorder Labs: Lab Results  Component Value Date   HGBA1C 5.8 04/08/2023   No results found for: PROLACTIN Lab Results  Component Value Date   CHOL 170  04/08/2023   TRIG 115.0 04/08/2023   HDL 53.30 04/08/2023   CHOLHDL 3 04/08/2023   VLDL 23.0 04/08/2023   LDLCALC 94 04/08/2023   LDLCALC 86 09/01/2022   Lab Results  Component Value Date   TSH 3.13 09/01/2022    Therapeutic Level Labs: No results found for: LITHIUM No results found for: VALPROATE No results found for: CBMZ  Current Medications: Current Outpatient Medications  Medication Sig Dispense Refill   albuterol (VENTOLIN HFA) 108 (90 Base) MCG/ACT inhaler Inhale into the lungs.     buPROPion  (WELLBUTRIN  XL) 150 MG 24 hr tablet Take 1 tablet (150 mg total) by mouth in the morning. Stop 300 mg tablet 90 tablet 0   Cholecalciferol 25 MCG (1000 UT) capsule Take by mouth daily.     clobetasol  cream (TEMOVATE ) 0.05 % APPLY 1 APPLICATION TOPICALLY 2 (TWO) TIMES DAILY. 30 g 0   cycloSPORINE (RESTASIS) 0.05 % ophthalmic emulsion      diazepam (VALIUM) 5 MG tablet Take by mouth.     estradiol  (VIVELLE -DOT) 0.025 MG/24HR Place 1 patch onto the skin 2 (two) times a week. 8 patch 12   Fexofenadine HCl (ALLEGRA PO) Take by mouth as needed.     fluticasone (FLONASE) 50 MCG/ACT nasal spray 2 sprays daily.      fluticasone-salmeterol (ADVAIR) 250-50 MCG/ACT AEPB SMARTSIG:1 Puff(s) Via Inhaler Every 12 Hours     hydrochlorothiazide (HYDRODIURIL) 25 MG tablet TAKE 1 TABLET BY MOUTH ONCE DAILY 90 tablet 2   L-Lysine 1000 MG TABS Take 1 tablet by mouth daily.     losartan  (COZAAR ) 25 MG tablet Take 1 tablet (25 mg total) by mouth daily. 90 tablet 3   medroxyPROGESTERone  (PROVERA ) 5 MG tablet Take 1 tablet (5 mg total) by mouth daily. 30 tablet 12   montelukast (SINGULAIR) 10 MG tablet TAKE 1 TABLET BY MOUTH DAILY 90 tablet 1   Omega-3 Fatty Acids (FISH OIL) 1200 MG CAPS Take by mouth daily in the afternoon.     omeprazole  (PRILOSEC) 40 MG capsule TAKE 1 CAPSULE (40 MG TOTAL) BY MOUTH DAILY. 30 capsule 3   Probiotic Product (PROBIOTIC PO) Take by mouth daily.     triamcinolone cream  (KENALOG) 0.1 % SMARTSIG:sparingly Topical Twice Daily     venlafaxine  XR (EFFEXOR  XR) 37.5 MG 24 hr capsule Take 1 capsule (37.5 mg total) by mouth daily with breakfast. Take daily with 75 mg 90 capsule 0   venlafaxine   XR (EFFEXOR -XR) 75 MG 24 hr capsule Take 1 capsule (75 mg total) by mouth 2 (two) times daily with a meal. 180 capsule 3   zaleplon  (SONATA ) 10 MG capsule TAKE 1 CAPSULE BY MOUTH AT BEDTIME AS NEEDED FOR SLEEP. 30 capsule 5   No current facility-administered medications for this visit.     Musculoskeletal: Strength & Muscle Tone: within normal limits Gait & Station: normal Patient leans: N/A  Psychiatric Specialty Exam: Review of Systems  Psychiatric/Behavioral:  The patient is nervous/anxious.     Blood pressure 126/78, pulse 74, temperature 98.2 F (36.8 C), temperature source Temporal, height 5' 5 (1.651 m), weight 219 lb 3.2 oz (99.4 kg), SpO2 98%.Body mass index is 36.48 kg/m.  General Appearance: Casual  Eye Contact:  Fair  Speech:  Clear and Coherent  Volume:  Normal  Mood:  Anxious  Affect:  Appropriate  Thought Process:  Goal Directed and Descriptions of Associations: Intact  Orientation:  Full (Time, Place, and Person)  Thought Content: Logical   Suicidal Thoughts:  No  Homicidal Thoughts:  No  Memory:  Immediate;   Fair Recent;   Fair Remote;   Fair  Judgement:  Fair  Insight:  Fair  Psychomotor Activity:  Normal  Concentration:  Concentration: Fair and Attention Span: Fair  Recall:  [employer] of Knowledge: Fair  Language: Fair  Akathisia:  No  Handed:  Right  AIMS (if indicated): not done  Assets:  Manufacturing systems engineer Desire for Improvement Housing Social Support Transportation  ADL's:  Intact  Cognition: WNL  Sleep:  Fair   Screenings: Midwife Visit from 09/29/2021 [facility] Office Visit from 10/01/2020 in [facility]  AIMS  Total Score 0 0   GAD-7    Flowsheet Row Office Visit from 07/21/2023 in [facility] Office Visit from 04/08/2023 in [facility] [place] Office Visit from 03/10/2023 in [facility] [place] Counselor from 02/15/2023 in [facility] Office Visit from 02/12/2023 in [facility] [facility]  Total GAD-7 Score 10 0 1 9 1    Mini-Mental    Flowsheet Row Office Visit from 05/22/2020 in [facility]  Total Score (max 30 points ) 29   PHQ2-9    Flowsheet Row Office Visit from 07/21/2023 in [facility] Office Visit from 04/08/2023 in [facility] Office Visit from 03/10/2023 in [facility] [place] Counselor from 02[facility]15/2023 in [facility] Office Visit from 02/12/2023 in [facility] Total Score 3 0 0 1 0  PHQ-9 Total Score 8 0 0 7 2   Flowsheet Row Office Visit from 09/08/2023 in [facility] Office Visit from 07/21/2023 in [facility] ED from 03/03/2023 in [facility] Department at [facility]  C-SSRS RISK CATEGORY No Risk No Risk No Risk     Assessment and Plan: Lisa Chambers is a 73 year old Caucasian female with history of depression, anxiety, recent medical issues including dizziness with multiple workup, was evaluated in office today for a follow-up appointment.  Discussed assessment and plan as noted below.  Major depressive disorder-in partial remission Generalized anxiety disorder-improving Currently reports improvement of depression and  anxiety on the higher dosage of venlafaxine . Continue Wellbutrin  at reduced to 0.50 mg  daily Continue Venlafaxine  187.5 mg daily Continue psychotherapy sessions with Ms. Almarie Ligas  Insomnia-stable Currently reports sleep is overall good. Continue Sonata  10 mg daily at bedtime Continue CPAP use. Reviewed [place] PMP AWARxE    Collaboration of Care: Collaboration of Care: Referral or follow-up with counselor/therapist AEB encouraged to continue psychotherapy sessions, reviewed notes per Ms. Ligas dated 08/23/2023 patient currently engaged in writing exercises working on coping strategies.  Patient/Guardian was advised Release of Information must be obtained prior to any record release in order to collaborate their care with an outside provider. Patient/Guardian was advised if they have not already done so to contact the registration department to sign all necessary forms in order for us  to release information regarding their care.   Consent: Patient/Guardian gives verbal consent for treatment and assignment of benefits for services provided during this visit. Patient/Guardian expressed understanding and agreed to proceed.  This note was generated in part or whole with voice recognition software. Voice recognition is usually quite accurate but there are transcription errors that can and very often do occur. I apologize for any typographical errors that were not detected and corrected.     Theophilus Walz, MD 09/08/2023, 11:34 AM

## 2023-09-09 ENCOUNTER — Telehealth: Payer: Self-pay | Admitting: Diagnostic Neuroimaging

## 2023-09-09 NOTE — Telephone Encounter (Signed)
 EEG scheduled

## 2023-09-10 NOTE — Telephone Encounter (Signed)
 Pt is scheduled for EEG 09/27/23

## 2023-09-13 ENCOUNTER — Ambulatory Visit

## 2023-09-14 ENCOUNTER — Ambulatory Visit: Admitting: Professional Counselor

## 2023-09-14 DIAGNOSIS — Z85828 Personal history of other malignant neoplasm of skin: Secondary | ICD-10-CM | POA: Diagnosis not present

## 2023-09-14 DIAGNOSIS — L821 Other seborrheic keratosis: Secondary | ICD-10-CM | POA: Diagnosis not present

## 2023-09-14 DIAGNOSIS — L820 Inflamed seborrheic keratosis: Secondary | ICD-10-CM | POA: Diagnosis not present

## 2023-09-14 DIAGNOSIS — L814 Other melanin hyperpigmentation: Secondary | ICD-10-CM | POA: Diagnosis not present

## 2023-09-15 ENCOUNTER — Ambulatory Visit

## 2023-09-20 ENCOUNTER — Ambulatory Visit

## 2023-09-21 DIAGNOSIS — I6529 Occlusion and stenosis of unspecified carotid artery: Secondary | ICD-10-CM | POA: Insufficient documentation

## 2023-09-21 NOTE — Progress Notes (Unsigned)
 MRN : [id number]  Lisa Chambers is a 73 y.o. ([birth year]) female who presents with chief complaint of check carotid arteries.  History of Present Illness:   The patient is seen for evaluation of carotid stenosis. The carotid stenosis was identified after ultrasound obtained 08/09/2023.  This study was reviewed by me and demonstrates minimal changes of the RICA.  1 to 39% stenosis of the and LICA.  Study was ordered secondary to dizziness associated with headache and nausea.  The patient denies amaurosis fugax. There is no recent history of TIA symptoms or focal motor deficits. There is no prior documented CVA.  There is no history of seizures.  The patient is taking enteric-coated aspirin 81 mg daily.  No recent shortening of the patient's walking distance or new symptoms consistent with claudication.  No history of rest pain symptoms. No new ulcers or wounds of the lower extremities have occurred.  There is no history of DVT, PE or superficial thrombophlebitis. No recent episodes of angina or shortness of breath documented.   No outpatient medications have been marked as taking for the 09/23/23 encounter (Appointment) with Jama, Cordella MATSU, MD.    Past Medical History:  Diagnosis Date   Allergic rhinitis    Anemia    Asthma    Cancer (HCC)    skin   Chest pain    a. 01/2020 MV: EF 70%, no isch/infarct. CT attenuation correction images w/ minimal Ao athersclerosis, no significant cor Ca2+.   Depression    GERD (gastroesophageal reflux disease)    Hearing loss    HTN (hypertension)    Hx of migraine headaches    Multinodular goiter    OSA on CPAP    Osteoarthritis    Thyroid  disease     Past Surgical History:  Procedure Laterality Date   BREAST BIOPSY Right 2021   fibroadenoma, benign   CHOLECYSTECTOMY     COLONOSCOPY WITH PROPOFOL  N/A 10/20/2022   Procedure: COLONOSCOPY WITH PROPOFOL ;  Surgeon: Toledo, Ladell MARLA, MD;  Location: [facility]  ENDOSCOPY;  Service: Gastroenterology;  Laterality: N/A;   HEMOSTASIS CLIP PLACEMENT  10/20/2022   Procedure: HEMOSTASIS CLIP PLACEMENT;  Surgeon: Aundria Ladell MARLA, MD;  Location: [facility] ENDOSCOPY;  Service: Gastroenterology;;   NASAL SINUS SURGERY  1996, 2007   POLYPECTOMY  10/20/2022   Procedure: POLYPECTOMY;  Surgeon: Aundria, Ladell MARLA, MD;  Location: [facility] ENDOSCOPY;  Service: Gastroenterology;;   REPLACEMENT TOTAL KNEE Bilateral 2017   TUBAL LIGATION  1994    Social History Social History   Tobacco Use   Smoking status: Never   Smokeless tobacco: Never  Vaping Use   Vaping status: Never Used  Substance Use Topics   Alcohol use: No   Drug use: No    Family History Family History  Problem Relation Age of Onset   Breast cancer Mother    Lung cancer Father    Pneumonia Father    Alcohol abuse Brother    Diabetes Brother    Heart attack Brother    COPD Brother    Insomnia Brother    Colon cancer Maternal Grandmother     Allergies  Allergen Reactions   Aspirin Hives and Swelling    Lips only.  Lips only.  Lips only.   Sulfa Antibiotics Hives   Erythromycin Other (See Comments)    GI upset  Other reaction(s):  Other (See Comments)  GI upset  GI upset  GI upset  GI upset   Levofloxacin Other (See Comments)    Joint pain    Tetracycline Other (See Comments)    GI upset  Other reaction(s): Other (See Comments)  GI upset  GI upset  GI upset  GI upset     REVIEW OF SYSTEMS (Negative unless checked)  Constitutional: [] Weight loss  [] Fever  [] Chills Cardiac: [] Chest pain   [] Chest pressure   [] Palpitations   [] Shortness of breath when laying flat   [] Shortness of breath with exertion. Vascular:  [x] Pain in legs with walking   [] Pain in legs at rest  [] History of DVT   [] Phlebitis   [] Swelling in legs   [] Varicose veins   [] Non-healing ulcers Pulmonary:   [] Uses home oxygen   [] Productive cough   [] Hemoptysis   [] Wheeze  [] COPD   [] Asthma Neurologic:   [] Dizziness   [] Seizures   [] History of stroke   [] History of TIA  [] Aphasia   [] Vissual changes   [] Weakness or numbness in arm   [] Weakness or numbness in leg Musculoskeletal:   [] Joint swelling   [] Joint pain   [] Low back pain Hematologic:  [] Easy bruising  [] Easy bleeding   [] Hypercoagulable state   [] Anemic Gastrointestinal:  [] Diarrhea   [] Vomiting  [] Gastroesophageal reflux/heartburn   [] Difficulty swallowing. Genitourinary:  [] Chronic kidney disease   [] Difficult urination  [] Frequent urination   [] Blood in urine Skin:  [] Rashes   [] Ulcers  Psychological:  [] History of anxiety   []  History of major depression.  Physical Examination  There were no vitals filed for this visit. There is no height or weight on file to calculate BMI. Gen: WD/WN, NAD Head: [place]/AT, No temporalis wasting.  Ear/Nose/Throat: Hearing grossly intact, nares w/o erythema or drainage Eyes: PER, EOMI, sclera nonicteric.  Neck: Supple, no masses.  No bruit or JVD.  Pulmonary:  Good air movement, no audible wheezing, no use of accessory muscles.  Cardiac: RRR, normal S1, S2, no Murmurs. Vascular:  carotid bruit noted Vessel Right Left  Radial Palpable Palpable  Carotid  Palpable  Palpable  Subclav  Palpable Palpable  Gastrointestinal: soft, non-distended. No guarding/no peritoneal signs.  Musculoskeletal: M/S 5/5 throughout.  No visible deformity.  Neurologic: CN 2-12 intact. Pain and light touch intact in extremities.  Symmetrical.  Speech is fluent. Motor exam as listed above. Psychiatric: Judgment intact, Mood & affect appropriate for pt's clinical situation. Dermatologic: No rashes or ulcers noted.  No changes consistent with cellulitis.   CBC Lab Results  Component Value Date   WBC 7.4 03/03/2023   HGB 13.7 03/03/2023   HCT 42.3 03/03/2023   MCV 85.3 03/03/2023   PLT 335 03/03/2023    BMET    Component Value Date/Time   NA 139 03/03/2023 1124   NA 143 11/24/2011 0054   K 3.5 03/03/2023 1124    K 3.7 11/24/2011 0054   CL 102 03/03/2023 1124   CL 110 (H) 11/24/2011 0054   CO2 26 03/03/2023 1124   CO2 25 11/24/2011 0054   GLUCOSE 105 (H) 03/03/2023 1124   GLUCOSE 99 11/24/2011 0054   BUN 14 03/03/2023 1124   BUN 13 11/24/2011 0054   CREATININE 0.71 03/03/2023 1124   CREATININE 1.06 11/24/2011 0054   CALCIUM 9.0 03/03/2023 1124   CALCIUM 8.9 11/24/2011 0054   GFRNONAA >60 03/03/2023 1124   GFRNONAA 58 (L) 11/24/2011 0054   GFRAA >60 07/10/2019 1403   GFRAA >60 11/24/2011 0054  CrCl cannot be calculated (Patient's most recent lab result is older than the maximum 21 days allowed.).  COAG No results found for: INR, PROTIME  Radiology No results found.   Assessment/Plan There are no diagnoses linked to this encounter.   Cordella Shawl, MD  09/21/2023 12:19 PM

## 2023-09-22 ENCOUNTER — Ambulatory Visit

## 2023-09-22 DIAGNOSIS — G4733 Obstructive sleep apnea (adult) (pediatric): Secondary | ICD-10-CM | POA: Diagnosis not present

## 2023-09-23 ENCOUNTER — Encounter (INDEPENDENT_AMBULATORY_CARE_PROVIDER_SITE_OTHER): Payer: Self-pay | Admitting: Vascular Surgery

## 2023-09-23 ENCOUNTER — Ambulatory Visit (INDEPENDENT_AMBULATORY_CARE_PROVIDER_SITE_OTHER): Admitting: Vascular Surgery

## 2023-09-23 ENCOUNTER — Other Ambulatory Visit: Payer: Self-pay | Admitting: Psychiatry

## 2023-09-23 VITALS — BP 120/77 | HR 97 | Ht 65.0 in | Wt 209.0 lb

## 2023-09-23 DIAGNOSIS — F5101 Primary insomnia: Secondary | ICD-10-CM

## 2023-09-23 DIAGNOSIS — J45909 Unspecified asthma, uncomplicated: Secondary | ICD-10-CM | POA: Diagnosis not present

## 2023-09-23 DIAGNOSIS — F3342 Major depressive disorder, recurrent, in full remission: Secondary | ICD-10-CM

## 2023-09-23 DIAGNOSIS — I6522 Occlusion and stenosis of left carotid artery: Secondary | ICD-10-CM | POA: Diagnosis not present

## 2023-09-23 DIAGNOSIS — I10 Essential (primary) hypertension: Secondary | ICD-10-CM

## 2023-09-23 DIAGNOSIS — E782 Mixed hyperlipidemia: Secondary | ICD-10-CM

## 2023-09-25 ENCOUNTER — Other Ambulatory Visit: Payer: Self-pay | Admitting: Family

## 2023-09-27 ENCOUNTER — Ambulatory Visit

## 2023-09-27 ENCOUNTER — Ambulatory Visit: Admitting: Diagnostic Neuroimaging

## 2023-09-27 DIAGNOSIS — R42 Dizziness and giddiness: Secondary | ICD-10-CM

## 2023-09-27 DIAGNOSIS — R4182 Altered mental status, unspecified: Secondary | ICD-10-CM

## 2023-09-28 ENCOUNTER — Encounter (INDEPENDENT_AMBULATORY_CARE_PROVIDER_SITE_OTHER): Payer: Self-pay | Admitting: Vascular Surgery

## 2023-09-28 DIAGNOSIS — M47812 Spondylosis without myelopathy or radiculopathy, cervical region: Secondary | ICD-10-CM | POA: Diagnosis not present

## 2023-09-29 ENCOUNTER — Ambulatory Visit

## 2023-10-04 ENCOUNTER — Encounter: Payer: Self-pay | Admitting: Diagnostic Neuroimaging

## 2023-10-04 ENCOUNTER — Ambulatory Visit

## 2023-10-05 ENCOUNTER — Encounter: Payer: Self-pay | Admitting: Family

## 2023-10-05 ENCOUNTER — Ambulatory Visit: Admitting: Professional Counselor

## 2023-10-06 ENCOUNTER — Ambulatory Visit

## 2023-10-07 ENCOUNTER — Other Ambulatory Visit: Payer: Self-pay | Admitting: *Deleted

## 2023-10-07 ENCOUNTER — Ambulatory Visit: Payer: Self-pay | Admitting: Diagnostic Neuroimaging

## 2023-10-07 NOTE — Procedures (Signed)
   [facility]  EEG (ELECTROENCEPHALOGRAM) REPORT   STUDY DATE: 09/27/23 PATIENT NAME: Lisa Chambers DOB: [birth year] MRN: [id number]  ORDERING CLINICIAN: Eduard Hanlon, MD   TECHNOLOGIST: MARLA Plummer TECHNIQUE: Electroencephalogram was recorded utilizing standard 10-20 system of lead placement and reformatted into average and bipolar montages.  RECORDING TIME: 25 minutes ACTIVATION: hyperventilation and photic stimulation  CLINICAL INFORMATION: 73 year old female with abnormal spells.  FINDINGS: Posterior dominant background rhythms, which attenuate with eye opening, ranging 10-11 hertz and 25-30 microvolts. No focal, lateralizing, epileptiform activity or seizures are seen. Patient recorded in the awake and drowsy state. EKG channel shows regular rhythm of 70-75 beats per minute.   IMPRESSION:   Normal EEG in the awake and drowsy states.     INTERPRETING PHYSICIAN:  EDUARD FABIENE HANLON, MD Certified in Neurology, Neurophysiology and Neuroimaging  [facility] [address], [address] [place], [place] [address] [phone]

## 2023-10-09 ENCOUNTER — Other Ambulatory Visit: Payer: Self-pay | Admitting: Psychiatry

## 2023-10-09 DIAGNOSIS — F330 Major depressive disorder, recurrent, mild: Secondary | ICD-10-CM

## 2023-10-12 ENCOUNTER — Ambulatory Visit

## 2023-10-14 ENCOUNTER — Ambulatory Visit

## 2023-10-18 ENCOUNTER — Ambulatory Visit

## 2023-10-20 ENCOUNTER — Ambulatory Visit

## 2023-10-22 ENCOUNTER — Other Ambulatory Visit: Payer: Self-pay | Admitting: Psychiatry

## 2023-10-22 ENCOUNTER — Encounter: Payer: Self-pay | Admitting: Family Medicine

## 2023-10-22 ENCOUNTER — Ambulatory Visit: Admitting: Family Medicine

## 2023-10-22 DIAGNOSIS — F3342 Major depressive disorder, recurrent, in full remission: Secondary | ICD-10-CM

## 2023-10-22 DIAGNOSIS — F5101 Primary insomnia: Secondary | ICD-10-CM

## 2023-10-22 DIAGNOSIS — Z79890 Hormone replacement therapy (postmenopausal): Secondary | ICD-10-CM

## 2023-10-22 MED ORDER — MEDROXYPROGESTERONE ACETATE 5 MG PO TABS: 5.0000 mg | ORAL_TABLET | Freq: Every day | ORAL | 12 refills | Status: AC

## 2023-10-22 MED ORDER — ESTRADIOL 0.025 MG/24HR TD PTTW: 1.0000 | MEDICATED_PATCH | TRANSDERMAL | 12 refills | Status: AC

## 2023-10-22 NOTE — Progress Notes (Signed)
 Patient presents for Annual.   Contraception: Post-menopausal Mammogram: 04/05/23 and Breast U/SBone Density scheduled 12/2023 STD Screening: Declines   pt wants to discuss progesterone and vivelle  dot patch

## 2023-10-22 NOTE — Progress Notes (Signed)
   GYNECOLOGY CARE ENCOUNTER NOTE  Subjective:  Lisa Chambers is a 73 y.o. No obstetric history on file. female here for a routine annual gynecologic exam.  Current complaints: talk about HRT.     HRT- on estrogen/progesterone  Denies abnormal vaginal bleeding, discharge, pelvic pain, problems with intercourse or other gynecologic concerns.    Gynecologic History No LMP recorded. Patient is postmenopausal. Contraception: post menopausal status Last Pap: Aged out of screening. Last mammogram: 03/2023. Results were: normal  Health Maintenance Due  Topic Date Due   COVID-19 Vaccine (3 - Pfizer risk series) 11/29/2019   Influenza Vaccine  09/10/2023    The following portions of the patient's history were reviewed and updated as appropriate: allergies, current medications, past family history, past medical history, past social history, past surgical history and problem list.  Review of Systems Pertinent items are noted in HPI.   Objective:  BP 117/76   Pulse 69   Wt 213 lb (96.6 kg)   BMI 35.45 kg/m  CONSTITUTIONAL: Well-developed, well-nourished female in no acute distress.  HENT:  Normocephalic, atraumatic, External right and left ear normal. Oropharynx is clear and moist EYES:  No scleral icterus.  NECK: Normal range of motion, supple, no masses.  Normal thyroid .  SKIN: Skin is warm and dry. No rash noted. Not diaphoretic. No erythema. No pallor. NEUROLOGIC: Alert and oriented to person, place, and time. Normal reflexes, muscle tone coordination. No cranial nerve deficit noted. PSYCHIATRIC: Normal mood and affect. Normal behavior. Normal judgment and thought content. CARDIOVASCULAR: Normal heart rate noted, regular rhythm. 2+ distal pulses. RESPIRATORY: Effort and breath sounds normal, no problems with respiration noted. BREASTS: Symmetric in size. No masses, skin changes, nipple drainage, or lymphadenopathy. ABDOMEN: Soft,  no distention noted.  No tenderness, rebound or  guarding.  PELVIC: deferred MUSCULOSKELETAL: Normal range of motion.   Assessment and Plan:  1) Annual gynecologic examination with pap smear:  Routine preventative health maintenance measures emphasized. Reviewed perimenopausal symptoms and management.   1. Hormone replacement therapy (00785) Reviewed r/b/a of HRT Discussed tapering at age 45 Up to date on mammogram and aged out of cervical cancer screening Denies vaginal bleeding  Patient stable on estrogen/progesterone combined therapy - estradiol  (VIVELLE -DOT) 0.025 MG/24HR; Place 1 patch onto the skin 2 (two) times a week.  Dispense: 8 patch; Refill: 12 - medroxyPROGESTERone  (PROVERA ) 5 MG tablet; Take 1 tablet (5 mg total) by mouth daily.  Dispense: 30 tablet; Refill: 12  Please refer to After Visit Summary for other counseling recommendations.   No follow-ups on file.  Suzen Maryan Masters, MD, MPH, ABFM Attending Physician Center for [facility]

## 2023-10-25 ENCOUNTER — Ambulatory Visit

## 2023-10-25 ENCOUNTER — Other Ambulatory Visit: Payer: Self-pay | Admitting: Family

## 2023-10-26 ENCOUNTER — Encounter: Payer: Self-pay | Admitting: Nurse Practitioner

## 2023-10-26 ENCOUNTER — Ambulatory Visit: Admitting: Nurse Practitioner

## 2023-10-26 VITALS — BP 118/70 | HR 68 | Temp 98.2°F | Ht 65.0 in | Wt 213.8 lb

## 2023-10-26 DIAGNOSIS — J069 Acute upper respiratory infection, unspecified: Secondary | ICD-10-CM

## 2023-10-26 DIAGNOSIS — R059 Cough, unspecified: Secondary | ICD-10-CM | POA: Diagnosis not present

## 2023-10-26 DIAGNOSIS — R5383 Other fatigue: Secondary | ICD-10-CM

## 2023-10-26 DIAGNOSIS — R6883 Chills (without fever): Secondary | ICD-10-CM | POA: Diagnosis not present

## 2023-10-26 LAB — POCT INFLUENZA A/B
Influenza A, POC: NEGATIVE
Influenza B, POC: NEGATIVE

## 2023-10-26 LAB — POC COVID19 BINAXNOW: SARS Coronavirus 2 Ag: NEGATIVE
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
## 2023-10-27 ENCOUNTER — Ambulatory Visit

## 2023-10-28 ENCOUNTER — Ambulatory Visit (INDEPENDENT_AMBULATORY_CARE_PROVIDER_SITE_OTHER): Admitting: Professional Counselor

## 2023-10-28 DIAGNOSIS — F3342 Major depressive disorder, recurrent, in full remission: Secondary | ICD-10-CM

## 2023-10-28 DIAGNOSIS — F411 Generalized anxiety disorder: Secondary | ICD-10-CM | POA: Diagnosis not present

## 2023-10-28 NOTE — Progress Notes (Signed)
  THERAPIST PROGRESS NOTE  Session Time: 9:05 AM - 9:55 AM  Participation Level: Active  Behavioral Response: Well Groomed, Alert, Anxious  Type of Therapy: Individual Therapy  Treatment Goals addressed: Active OP Depression  LTG: To make some changes to my behavior that become a pattern with me that help me feel less stressed or less right a rat running around trying to get everything done.                Start:  03/17/23    Expected End:  03/15/24      STG: To identify what I'm feeling and to speak about it or act on it at the very beginning instead of waiting until you get dumped on and explode.     STG: To identify tasks that I don't want to do and get my husband to do them. To improve assertiveness skills over the next 90 days.     STG: Lucee will earn to accept and adjust as she moves though stages of life   ProgressTowards Goals: Progressing  Interventions: Motivational Interviewing, Solution Focused, and Supportive  Summary: ROSELIND KLUS is a 73 y.o. female who presents with anxiety and depression. She appeared alert and oriented x5. She provided updates with herself and family members. [place] discussed a particular concern with her granddaughter being in a unhealthy relationship. She explored possible ways to engage with this family member to be supportive and not cause additional conflict.   Therapist Response: Conducted session with [name]. Began session with check-in/update since previous session. Utilized empathetic and reflective listening. Used open-ended questions to facilitate discussion and summarized Kinnley's thoughts/feelings. Explored ways to support family member without causing additional conflict. Scheduled additional appointment and concluded session.   Suicidal/Homicidal: No  Plan: Return again in 4 weeks.  Diagnosis: GAD (generalized anxiety disorder)  MDD (major depressive disorder), recurrent, in full remission (HCC)  Collaboration of Care:  Medication Management AEB chart review  Patient/Guardian was advised Release of Information must be obtained prior to any record release in order to collaborate their care with an outside provider. Patient/Guardian was advised if they have not already done so to contact the registration department to sign all necessary forms in order for us  to release information regarding their care.   Consent: Patient/Guardian gives verbal consent for treatment and assignment of benefits for services provided during this visit. Patient/Guardian expressed understanding and agreed to proceed.   [name], [facility] 10/29/2023

## 2023-11-01 ENCOUNTER — Ambulatory Visit

## 2023-11-03 ENCOUNTER — Ambulatory Visit

## 2023-11-04 DIAGNOSIS — J018 Other acute sinusitis: Secondary | ICD-10-CM | POA: Diagnosis not present

## 2023-11-08 ENCOUNTER — Other Ambulatory Visit: Payer: Self-pay | Admitting: Family

## 2023-11-09 ENCOUNTER — Other Ambulatory Visit: Payer: Self-pay | Admitting: Family

## 2023-11-09 DIAGNOSIS — J069 Acute upper respiratory infection, unspecified: Secondary | ICD-10-CM | POA: Insufficient documentation

## 2023-11-09 NOTE — Assessment & Plan Note (Signed)
 Acute upper respiratory symptoms likely due to seasonal allergies. COVID-19 and influenza tests negative. No fever. Possible fluid in ears from nasal congestion. - Continue Allegra for allergies. - Use Pataday for eye symptoms. -Increase fluid intake - She will let us  known if symptoms not improving.

## 2023-11-10 ENCOUNTER — Other Ambulatory Visit: Payer: Self-pay | Admitting: Family

## 2023-11-11 DIAGNOSIS — K219 Gastro-esophageal reflux disease without esophagitis: Secondary | ICD-10-CM | POA: Diagnosis not present

## 2023-11-11 DIAGNOSIS — J018 Other acute sinusitis: Secondary | ICD-10-CM | POA: Diagnosis not present

## 2023-11-11 NOTE — Addendum Note (Signed)
 Addended by: CORWIN ANTU on: 11/11/2023 02:08 PM   Modules accepted: Orders

## 2023-11-11 NOTE — Telephone Encounter (Signed)
 Lisa Chambers I accidentally approved her inhaler but wanted to decline it because it is not usually prescribed by me. Can you call pharmacy and tell them not to fill

## 2023-11-11 NOTE — Telephone Encounter (Signed)
 Disregard  I fixed it

## 2023-11-23 ENCOUNTER — Ambulatory Visit (INDEPENDENT_AMBULATORY_CARE_PROVIDER_SITE_OTHER): Admitting: Professional Counselor

## 2023-11-23 ENCOUNTER — Ambulatory Visit: Admitting: Pulmonary Disease

## 2023-11-23 ENCOUNTER — Encounter: Payer: Self-pay | Admitting: Pulmonary Disease

## 2023-11-23 VITALS — BP 139/86 | HR 92 | Ht 65.0 in | Wt 209.0 lb

## 2023-11-23 DIAGNOSIS — G4733 Obstructive sleep apnea (adult) (pediatric): Secondary | ICD-10-CM

## 2023-11-23 DIAGNOSIS — F330 Major depressive disorder, recurrent, mild: Secondary | ICD-10-CM

## 2023-11-23 DIAGNOSIS — F411 Generalized anxiety disorder: Secondary | ICD-10-CM | POA: Diagnosis not present

## 2023-11-23 MED ORDER — ALBUTEROL SULFATE HFA 108 (90 BASE) MCG/ACT IN AERS: 2.0000 | INHALATION_SPRAY | Freq: Four times a day (QID) | RESPIRATORY_TRACT | 5 refills | Status: AC | PRN

## 2023-11-23 NOTE — Patient Instructions (Addendum)
 Follow-up a year from now  Continue using CPAP on a nightly basis  DME referral for CPAP supplies  Placed a prescription for your albuterol  Call us  with significant concerns  Continue graded activities  Continue Advair  Good luck with weight loss efforts
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
## 2023-11-23 NOTE — Progress Notes (Signed)
  THERAPIST PROGRESS NOTE  Session Time: 2:01 PM - 2:46 PM  Participation Level: Active  Behavioral Response: Well Groomed, Alert, Anxious  Type of Therapy: Individual Therapy  Treatment Goals addressed: Active OP Depression  LTG: To make some changes to my behavior that become a pattern with me that help me feel less stressed or less right a rat running around trying to get everything done. (Progressing)    Start:  03/17/23    Expected End:  03/15/24    Goal Note Reviewed 11/23/23 - I don't feel guilty for taking time that I need for myself. One of the things is the workshop for [employer]. I feel good for taking that time and the same thing for when I go walking. I may be gone an hour.   STG: To identify what I'm feeling and to speak about it or act on it at the very beginning instead of waiting until you get dumped on and explode.  (Progressing)  Goal Note Reviewed 11/23/23 - Still working on this goal  STG: To identify tasks that I don't want to do and get my husband to do them. To improve assertiveness skills over the next 90 days.  (Progressing)  Goal Note Reviewed 11/23/23 - I think I still need to work on it.   STG: Shantal will earn to accept and adjust as she moves though stages of life (Progressing)  Goal Note Reviewed 11/23/23 - I feel like I am doing that more. One way I'm doing that, since I started losing weight, I've started paying attention to clothes that were in my closet that were too big and as I've found them, I've folded them up and donated them.   ProgressTowards Goals: Progressing  Interventions: CBT, Motivational Interviewing, Assertiveness Training, and Supportive  Summary: NEILAH FULWIDER is a 73 y.o. female who presents with a history of anxiety and depression. She appeared alert and oriented x5. She shared updates about her granddaughter. Delyla shared other incidents she has struggled with lately and was receptive to coping skills and  assertiveness techniques to practice. She noted progress on goals and areas for continued improvement.   Therapist Response: Conducted session with [name]. Began session with check-in/update since previous session. Utilized empathetic and reflective listening. Used open-ended questions to facilitate discussion and summarized Addysen's thoughts/feelings. Inquired about Garnell's thought processes in response to situations and discussed ways to restructure perceptions/coping skills (STOP) to reduce emotional reactions. Modeled ways to be assertive and address concerns in a healthy way. Reviewed treatment plan with input from [facility] on current strengths, needs, and progress towards goals. Scheduled additional appointment and concluded session.   Suicidal/Homicidal: No  Plan: Return again in 3 weeks.  Diagnosis: GAD (generalized anxiety disorder)  MDD (major depressive disorder), recurrent episode, mild  Collaboration of Care: Medication Management AEB chart review  Patient/Guardian was advised Release of Information must be obtained prior to any record release in order to collaborate their care with an outside provider. Patient/Guardian was advised if they have not already done so to contact the registration department to sign all necessary forms in order for us  to release information regarding their care.   Consent: Patient/Guardian gives verbal consent for treatment and assignment of benefits for services provided during this visit. Patient/Guardian expressed understanding and agreed to proceed.   [name], [facility] 11/23/2023

## 2023-11-25 DIAGNOSIS — K219 Gastro-esophageal reflux disease without esophagitis: Secondary | ICD-10-CM | POA: Diagnosis not present

## 2023-11-25 DIAGNOSIS — H6121 Impacted cerumen, right ear: Secondary | ICD-10-CM | POA: Diagnosis not present

## 2023-11-25 DIAGNOSIS — J018 Other acute sinusitis: Secondary | ICD-10-CM | POA: Diagnosis not present

## 2023-12-09 ENCOUNTER — Encounter: Payer: Self-pay | Admitting: Psychiatry

## 2023-12-09 ENCOUNTER — Ambulatory Visit (INDEPENDENT_AMBULATORY_CARE_PROVIDER_SITE_OTHER): Admitting: Psychiatry

## 2023-12-09 VITALS — BP 126/76 | HR 83 | Temp 97.6°F | Ht 64.5 in | Wt 208.0 lb

## 2023-12-09 DIAGNOSIS — F330 Major depressive disorder, recurrent, mild: Secondary | ICD-10-CM | POA: Insufficient documentation

## 2023-12-09 DIAGNOSIS — F5101 Primary insomnia: Secondary | ICD-10-CM | POA: Diagnosis not present

## 2023-12-09 DIAGNOSIS — F411 Generalized anxiety disorder: Secondary | ICD-10-CM | POA: Diagnosis not present

## 2023-12-09 MED ORDER — BUPROPION HCL ER (XL) 300 MG PO TB24
300.0000 mg | ORAL_TABLET | Freq: Every morning | ORAL | 1 refills | Status: AC
Start: 2023-12-09 — End: ?

## 2023-12-09 NOTE — Progress Notes (Signed)
 [facility] MD OP Progress Note  12/09/2023 2:37 PM Lisa Chambers  MRN:  [id number]  Chief Complaint:  Chief Complaint  Patient presents with   Follow-up   Anxiety   Depression   Medication Refill   Discussed the use of AI scribe software for clinical note transcription with the patient, who gave verbal consent to proceed.  History of Present Illness Lisa SALTZ is a 73 year old Caucasian female, married, [place] in [place], has a history of MDD, primary insomnia, obstructive sleep apnea on CPAP, asthma, hypertension, GERD, history of thyroid  nodule was evaluated in office today for follow-up appointment.  Over the past month, she describes feeling not great, with increased pressure and stress primarily related to her caregiving responsibilities for her husband. She notes a recent increase in her husband's dependence on her, especially over the past week, including requests for her to accompany him on errands and appointments, which represents a change from their previous routine. Managing household responsibilities, finances, and meal planning has become overwhelming for her, particularly as her husband has been losing muscle mass and weight.  She continues to experience symptoms of depression, including feeling overwhelmed, difficulty managing time, and a sense of not being able to accomplish all necessary tasks. She reports stable appetite and remains mindful of her diet. Frequent outreach from family members for support adds to her sense of being overburdened. While providing emotional support to her family helps her as well, she recognizes the importance of setting boundaries to prevent burnout.  Sleep disturbance persists, with her falling asleep but often experiencing chaotic dreams that sometimes wake her or leave her feeling unrested. She takes Sonata  nightly for sleep. Her current medications include venlafaxine  187.5 mg and Wellbutrin , which she previously reduced due to concerns  about seizures. She denies experiencing any side effects from Wellbutrin  in the past.   She denies suicidal thoughts or thoughts of harming herself or others.  She had medical clearance per neurology, vascular surgery, ruled out seizures and other causes of dizziness.  She has not had any dizziness in the past few months and she is relieved.  However she continues to worry about the cause of her dizziness which has not been found.  She has upcoming appointment scheduled with Ms. Almarie Ligas her therapist and she continues to be motivated to stay in therapy.  Visit Diagnosis:    ICD-10-CM   1. MDD (major depressive disorder), recurrent episode, mild  F33.0 buPROPion  (WELLBUTRIN  XL) 300 MG 24 hr tablet    2. GAD (generalized anxiety disorder)  F41.1     3. Primary insomnia  F51.01       Past Psychiatric History: I have reviewed past psychiatric history from progress note on 12/13/2019.  Past trials of Ambien, Abilify, Effexor , Adderall, Wellbutrin , Viibryd, hydroxyzine .  Previous neuropsychological testing per Dr. Channie dated 04/10/2020, results of cognitive evaluation largely within normal limits with mild frontal subcortical dysfunction.  Testing did not warrant a diagnosis of neurodevelopmental or neurocognitive disorder.  Past Medical History:  Past Medical History:  Diagnosis Date   Allergic rhinitis    Anemia    Asthma    Cancer (HCC)    skin   Chest pain    a. 01/2020 MV: EF 70%, no isch/infarct. CT attenuation correction images w/ minimal Ao athersclerosis, no significant cor Ca2+.   Depression    GERD (gastroesophageal reflux disease)    Hearing loss    HTN (hypertension)    Hx of migraine headaches  Multinodular goiter    OSA on CPAP    Osteoarthritis    Thyroid  disease     Past Surgical History:  Procedure Laterality Date   BREAST BIOPSY Right 2021   fibroadenoma, benign   CHOLECYSTECTOMY     COLONOSCOPY WITH PROPOFOL  N/A 10/20/2022   Procedure: COLONOSCOPY  WITH PROPOFOL ;  Surgeon: Toledo, Ladell POUR, MD;  Location: [facility] ENDOSCOPY;  Service: Gastroenterology;  Laterality: N/A;   HEMOSTASIS CLIP PLACEMENT  10/20/2022   Procedure: HEMOSTASIS CLIP PLACEMENT;  Surgeon: Aundria Ladell POUR, MD;  Location[facility] ENDOSCOPY;  Service: Gastroenterology;;   NASAL SINUS SURGERY  1996, 2007   POLYPECTOMY  10/20/2022   Procedure: POLYPECTOMY;  Surgeon: Aundria, Ladell POUR, MD;  Location: [facility] ENDOSCOPY;  Service: Gastroenterology;;   REPLACEMENT TOTAL KNEE Bilateral 2017   TUBAL LIGATION  1994    Family Psychiatric History: I reviewed family psychiatric history from progress note on 12/13/2019.  Family History:  Family History  Problem Relation Age of Onset   Breast cancer Mother    Lung cancer Father    Pneumonia Father    Alcohol abuse Brother    Diabetes Brother    Heart attack Brother    COPD Brother    Insomnia Brother    Colon cancer Maternal Grandmother     Social History: I have reviewed social history from progress note on 12/13/2019. Social History   Socioeconomic History   Marital status: Married    Spouse name: Francis   Number of children: 2   Years of education: Not on file   Highest education level: Bachelor's degree (e.g., BA, AB, BS)  Occupational History    Comment: retired    Associate Professor: retired  Tobacco Use   Smoking status: Never   Smokeless tobacco: Never  Vaping Use   Vaping status: Never Used  Substance and Sexual Activity   Alcohol use: No   Drug use: No   Sexual activity: Not Currently  Other Topics Concern   Not on file  Social History Narrative   Lives with husband   Caffeine- diet coke 2 daily   Social Drivers of Health   Financial Resource Strain: Low Risk  (03/09/2023)   Overall Financial Resource Strain (CARDIA)    Difficulty of Paying Living Expenses: Not hard at all  Food Insecurity: No Food Insecurity (03/09/2023)   Hunger Vital Sign    Worried About Running Out of Food in the Last Year: Never true     Ran Out of Food in the Last Year: Never true  Transportation Needs: No Transportation Needs (03/09/2023)   PRAPARE - Administrator, Civil Service (Medical): No    Lack of Transportation (Non-Medical): No  Physical Activity: Insufficiently Active (03/09/2023)   Exercise Vital Sign    Days of Exercise per Week: 3 days    Minutes of Exercise per Session: 20 min  Stress: Stress Concern Present (03/09/2023)   [employer] of Occupational Health - Occupational Stress Questionnaire    Feeling of Stress : To some extent  Social Connections: Socially Integrated (03/09/2023)   Social Connection and Isolation Panel    Frequency of Communication with Friends and Family: More than three times a week    Frequency of Social Gatherings with Friends and Family: Twice a week    Attends Religious Services: More than 4 times per year    Active Member of [employer] or Organizations: Yes    Attends Banker Meetings: More than 4 times per year  Marital Status: Married    Allergies:  Allergies  Allergen Reactions   Aspirin Hives and Swelling    Lips only.  Lips only.  Lips only.   Sulfa Antibiotics Hives   Erythromycin Other (See Comments)    GI upset  Other reaction(s): Other (See Comments)  GI upset  GI upset  GI upset  GI upset   Levofloxacin Other (See Comments)    Joint pain    Tetracycline Other (See Comments)    GI upset  Other reaction(s): Other (See Comments)  GI upset  GI upset  GI upset  GI upset    Metabolic Disorder Labs: Lab Results  Component Value Date   HGBA1C 5.8 04/08/2023   No results found for: PROLACTIN Lab Results  Component Value Date   CHOL 170 04/08/2023   TRIG 115.0 04/08/2023   HDL 53.30 04/08/2023   CHOLHDL 3 04/08/2023   VLDL 23.0 04/08/2023   LDLCALC 94 04/08/2023   LDLCALC 86 09/01/2022   Lab Results  Component Value Date   TSH 3.13 09/01/2022    Therapeutic Level Labs: No results found for: LITHIUM No  results found for: VALPROATE No results found for: CBMZ  Current Medications: Current Outpatient Medications  Medication Sig Dispense Refill   albuterol (VENTOLIN HFA) 108 (90 Base) MCG/ACT inhaler Inhale 2 puffs into the lungs every 6 (six) hours as needed for wheezing or shortness of breath. 18 g 5   buPROPion  (WELLBUTRIN  XL) 300 MG 24 hr tablet Take 1 tablet (300 mg total) by mouth in the morning. DOSE INCREASE 90 tablet 1   Cholecalciferol 25 MCG (1000 UT) capsule Take by mouth daily.     clobetasol  cream (TEMOVATE ) 0.05 % APPLY 1 APPLICATION TOPICALLY 2 (TWO) TIMES DAILY. 30 g 0   cycloSPORINE (RESTASIS) 0.05 % ophthalmic emulsion      estradiol  (VIVELLE -DOT) 0.025 MG/24HR Place 1 patch onto the skin 2 (two) times a week. 8 patch 12   Fexofenadine HCl (ALLEGRA PO) Take by mouth as needed.     fluticasone (FLONASE) 50 MCG/ACT nasal spray 2 sprays daily.      fluticasone-salmeterol (ADVAIR) 250-50 MCG/ACT AEPB INHALE 1 PUFF INTO THE LUNGS EVERY 12 HOURS 60 each 12   hydrochlorothiazide (HYDRODIURIL) 25 MG tablet TAKE 1 TABLET BY MOUTH ONCE DAILY 90 tablet 0   L-Lysine 1000 MG TABS Take 1 tablet by mouth daily.     losartan  (COZAAR ) 25 MG tablet Take 1 tablet (25 mg total) by mouth daily. 90 tablet 3   medroxyPROGESTERone  (PROVERA ) 5 MG tablet Take 1 tablet (5 mg total) by mouth daily. 30 tablet 12   montelukast (SINGULAIR) 10 MG tablet TAKE 1 TABLET BY MOUTH DAILY 90 tablet 1   Omega-3 Fatty Acids (FISH OIL) 1200 MG CAPS Take by mouth daily in the afternoon.     omeprazole  (PRILOSEC) 40 MG capsule TAKE 1 CAPSULE (40 MG TOTAL) BY MOUTH DAILY. 30 capsule 3   Probiotic Product (PROBIOTIC PO) Take by mouth daily.     triamcinolone cream (KENALOG) 0.1 % SMARTSIG:sparingly Topical Twice Daily     venlafaxine  XR (EFFEXOR -XR) 37.5 MG 24 hr capsule TAKE 1 CAPSULE (37.5 MG TOTAL) BY MOUTH DAILY WITH BREAKFAST. TAKE DAILY WITH 75 MG CAPSULE. 90 capsule 1   venlafaxine  XR (EFFEXOR -XR) 75 MG 24 hr  capsule Take 1 capsule (75 mg total) by mouth 2 (two) times daily with a meal. (Patient taking differently: Take 75 mg by mouth 2 (two) times daily with a meal.  Takes one a day now with the 37.5 mg) 180 capsule 3   zaleplon  (SONATA ) 10 MG capsule Take 1 capsule (10 mg total) by mouth at bedtime as needed for sleep. 30 capsule 5   diazepam (VALIUM) 5 MG tablet Take by mouth. (Patient not taking: Reported on 12/09/2023)     No current facility-administered medications for this visit.     Musculoskeletal: Strength & Muscle Tone: within normal limits Gait & Station: normal Patient leans: N/A  Psychiatric Specialty Exam: Review of Systems  Psychiatric/Behavioral:  Positive for dysphoric mood and sleep disturbance. The patient is nervous/anxious.     Blood pressure 126/76, pulse 83, temperature 97.6 F (36.4 C), temperature source Temporal, height 5' 4.5 (1.638 m), weight 208 lb (94.3 kg), SpO2 100%.Body mass index is 35.15 kg/m.  General Appearance: Casual  Eye Contact:  Good  Speech:  Clear and Coherent  Volume:  Normal  Mood:  Anxious and Depressed  Affect:  Congruent  Thought Process:  Goal Directed and Descriptions of Associations: Intact  Orientation:  Full (Time, Place, and Person)  Thought Content: Logical   Suicidal Thoughts:  No  Homicidal Thoughts:  No  Memory:  Immediate;   Fair Recent;   Fair Remote;   Fair  Judgement:  Fair  Insight:  Fair  Psychomotor Activity:  Normal  Concentration:  Concentration: Fair and Attention Span: Fair  Recall:  [employer] of Knowledge: Fair  Language: Fair  Akathisia:  No  Handed:  Right  AIMS (if indicated): not done  Assets:  Manufacturing Systems Engineer Desire for Improvement Housing Social Support Transportation  ADL's:  Intact  Cognition: WNL  Sleep:  Poor   Screenings: Geneticist, Molecular Office Visit from 09/29/2021 in [facility] Office Visit from 10/01/2020 in [facility]  AIMS Total Score 0 0   GAD-7    Flowsheet Row Office Visit from 12/09/2023 in [facility] Office Visit from 10[facility]26/2023 in [facility] Visit from 10/22/2023 in [facility] Office Visit from 07/21/2023 in [facility] Office Visit from 04/08/2023 in [facility]  Total GAD-7 Score 9 0 3 10 0   Mini-Mental    Flowsheet Row Office Visit from 05/22/2020 in [facility]  Total Score (max 30 points ) 29   PHQ2-9    Flowsheet Row Office Visit from 12/09/2023 in [facility] Office Visit from 10/26/2023 in [facility] Visit from 10/22/2023 in [facility] Visit from 07/21/2023 in [facility] Office Visit from 04/08/2023 in [facility]  PHQ-2 Total Score 4 0 0 3 0  PHQ-9 Total Score 12 0 1 8 0   Flowsheet Row Office Visit from 12/09/2023 in [facility] Office Visit from 09/08/2023 in [facility] Office Visit from 07/21/2023 in [facility]  C-SSRS RISK CATEGORY No Risk No Risk No Risk     Assessment and Plan: Lisa Chambers is a 73 year old Caucasian female with history of depression, anxiety, was evaluated in office today, discussed assessment and plan as noted below.  1. MDD (major depressive disorder), recurrent episode,  mild-unstable Ongoing depression symptoms mostly related to situational stressors.  Previously was on Wellbutrin  300 mg and she is interested in trial of the same again. Increase Wellbutrin   XL 300 mg daily Continue Venlafaxine  187.5 mg daily Continue psychotherapy sessions with Ms. Almarie Ligas  2. GAD (generalized anxiety disorder)-unstable Anxiety mostly related to her husband's deteriorating health and cognitive function. Encouraged to continue psychotherapy sessions. Provided supportive counseling.  3. Primary insomnia-unstable Currently reports vivid dreams that does affect her sleep.  She reports previous use of Melatonin may have helped. Start Melatonin low dosage at bedtime Continue Sonata  10 mg daily at bedtime Continue CPAP use  Follow-up Follow-up in clinic in 4 weeks or sooner if needed.      Collaboration of Care: Collaboration of Care: Referral or follow-up with counselor/therapist AEB encouraged to continue psychotherapy sessions.  Patient/Guardian was advised Release of Information must be obtained prior to any record release in order to collaborate their care with an outside provider. Patient/Guardian was advised if they have not already done so to contact the registration department to sign all necessary forms in order for us  to release information regarding their care.   Consent: Patient/Guardian gives verbal consent for treatment and assignment of benefits for services provided during this visit. Patient/Guardian expressed understanding and agreed to proceed.   This note was generated in part or whole with voice recognition software. Voice recognition is usually quite accurate but there are transcription errors that can and very often do occur. I apologize for any typographical errors that were not detected and corrected.    Khadir Roam, MD 12/09/2023, 2:37 PM

## 2023-12-14 ENCOUNTER — Other Ambulatory Visit

## 2023-12-14 ENCOUNTER — Ambulatory Visit: Admitting: Professional Counselor

## 2023-12-15 ENCOUNTER — Ambulatory Visit (HOSPITAL_BASED_OUTPATIENT_CLINIC_OR_DEPARTMENT_OTHER)
Admission: RE | Admit: 2023-12-15 | Discharge: 2023-12-15 | Disposition: A | Discharge status: Home / Self Care | Source: Ambulatory Visit | Attending: Family | Admitting: Family

## 2023-12-15 DIAGNOSIS — Z780 Asymptomatic menopausal state: Secondary | ICD-10-CM | POA: Diagnosis not present

## 2023-12-15 DIAGNOSIS — M3501 Sicca syndrome with keratoconjunctivitis: Secondary | ICD-10-CM | POA: Diagnosis not present

## 2023-12-15 DIAGNOSIS — H2513 Age-related nuclear cataract, bilateral: Secondary | ICD-10-CM | POA: Diagnosis not present

## 2023-12-16 ENCOUNTER — Ambulatory Visit: Payer: Self-pay | Admitting: Family

## 2023-12-20 DIAGNOSIS — H2513 Age-related nuclear cataract, bilateral: Secondary | ICD-10-CM | POA: Diagnosis not present

## 2023-12-20 DIAGNOSIS — M3501 Sicca syndrome with keratoconjunctivitis: Secondary | ICD-10-CM | POA: Diagnosis not present

## 2023-12-20 DIAGNOSIS — H43813 Vitreous degeneration, bilateral: Secondary | ICD-10-CM | POA: Diagnosis not present

## 2023-12-22 ENCOUNTER — Ambulatory Visit: Admitting: Professional Counselor

## 2023-12-23 ENCOUNTER — Other Ambulatory Visit: Payer: Self-pay | Admitting: Family

## 2023-12-23 DIAGNOSIS — R809 Proteinuria, unspecified: Secondary | ICD-10-CM

## 2023-12-29 DIAGNOSIS — H2512 Age-related nuclear cataract, left eye: Secondary | ICD-10-CM | POA: Diagnosis not present

## 2023-12-29 DIAGNOSIS — H2513 Age-related nuclear cataract, bilateral: Secondary | ICD-10-CM | POA: Diagnosis not present

## 2023-12-29 DIAGNOSIS — H2511 Age-related nuclear cataract, right eye: Secondary | ICD-10-CM | POA: Diagnosis not present

## 2024-01-11 NOTE — Discharge Instructions (Signed)

## 2024-01-12 ENCOUNTER — Encounter: Payer: Self-pay | Admitting: Psychiatry

## 2024-01-12 ENCOUNTER — Telehealth: Admitting: Psychiatry

## 2024-01-12 DIAGNOSIS — F411 Generalized anxiety disorder: Secondary | ICD-10-CM | POA: Diagnosis not present

## 2024-01-12 DIAGNOSIS — F3341 Major depressive disorder, recurrent, in partial remission: Secondary | ICD-10-CM | POA: Diagnosis not present

## 2024-01-12 DIAGNOSIS — F5101 Primary insomnia: Secondary | ICD-10-CM

## 2024-01-12 NOTE — Progress Notes (Signed)
 Virtual Visit via Video Note  I connected with Lisa Chambers on 01/12/24 at  3:00 PM EST by a video enabled telemedicine application and verified that I am speaking with the correct person using two identifiers.  Location Provider Location : [facility] Patient Location : Home  Participants: Patient , Provider   I discussed the limitations of evaluation and management by telemedicine and the availability of in person appointments. The patient expressed understanding and agreed to proceed.    I discussed the assessment and treatment plan with the patient. The patient was provided an opportunity to ask questions and all were answered. The patient agreed with the plan and demonstrated an understanding of the instructions.   The patient was advised to call back or seek an in-person evaluation if the symptoms worsen or if the condition fails to improve as anticipated.   BH MD OP Progress Note  01/12/2024 3:28 PM Lisa Chambers  MRN:  [id number]  Chief Complaint:  Chief Complaint  Patient presents with   Medication Refill   Follow-up   Depression   Anxiety   Discussed the use of AI scribe software for clinical note transcription with the patient, who gave verbal consent to proceed.  History of Present Illness Lisa Chambers is a 73 year old Caucasian female, married, [place], has a history of MDD, primary insomnia, obstructive sleep apnea on CPAP, asthma, hypertension, GERD, history of thyroid  nodule was evaluated by telemedicine today.   She reports improvement in mood since the last visit, describing better coping and feeling better overall. She reports waking up crying on the morning of the visit, which she relates to situational stressors such as upcoming cataract surgery and preparations for family visiting. She reports stress related to scheduling surgery and managing holiday preparations, but she states that rescheduling the surgery and receiving support from her husband  helped her cope. She identifies difficulty asking for help, though she acknowledges her husband's support and assistance.  She reports that her sleep has improved, with fewer awakenings during the night compared to before. Over the last couple of nights, she reports waking up early in the morning due to thoughts about tasks she needs to complete. 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  She states that she is currently taking Wellbutrin  300 mg daily, recently changed, and Effexor  187.5 mg daily, both taken together in the morning. She reports tolerating the higher dose of Wellbutrin  and notes improved mood. She confirms that she continues to see her therapist, with an upcoming appointment scheduled in January.  She denies any thoughts about hurting herself or others.    Visit Diagnosis:    ICD-10-CM   1. Recurrent major depressive disorder, in partial remission  F33.41     2. GAD (generalized anxiety disorder)  F41.1     3. Primary insomnia  F51.01       Past Psychiatric History: I have reviewed past psychiatric history from progress note on 12/13/2019.  Past trials of Ambien, Abilify, Effexor , Adderall, Wellbutrin , Viibryd, hydroxyzine .  Previous neuropsychological testing per Dr.Zussman dated 04/10/2020, results of cognitive evaluation largely within normal limits with mild frontal subcortical dysfunction.  Testing did not warrant a diagnosis of neurodevelopmental or neurocognitive disorder.  Past Medical History:  Past Medical History:  Diagnosis Date   Allergic rhinitis    Anemia    Asthma  Cancer (HCC)    skin   Chest pain    a. 01/2020 MV: EF 70%, no isch/infarct. CT attenuation correction images w/ minimal Ao athersclerosis, no significant cor Ca2+.   Depression    GERD (gastroesophageal reflux disease)     Hearing loss    HTN (hypertension)    Hx of migraine headaches    Multinodular goiter    OSA on CPAP    Osteoarthritis    Thyroid  disease     Past Surgical History:  Procedure Laterality Date   BREAST BIOPSY Right 2021   fibroadenoma, benign   CHOLECYSTECTOMY     COLONOSCOPY WITH PROPOFOL  N/A 10/20/2022   Procedure: COLONOSCOPY WITH PROPOFOL ;  Surgeon: Toledo, Ladell POUR, MD;  Location: [facility] ENDOSCOPY;  Service: Gastroenterology;  Laterality: N/A;   HEMOSTASIS CLIP PLACEMENT  10/20/2022   Procedure: HEMOSTASIS CLIP PLACEMENT;  Surgeon: Aundria Ladell POUR, MD;  Location: [facility] ENDOSCOPY;  Service: Gastroenterology;;   NASAL SINUS SURGERY  1996, 2007   POLYPECTOMY  10/20/2022   Procedure: POLYPECTOMY;  Surgeon: Aundria, Ladell POUR, MD;  Location: [facility] ENDOSCOPY;  Service: Gastroenterology;;   REPLACEMENT TOTAL KNEE Bilateral 2017   TUBAL LIGATION  1994    Family Psychiatric History: I have reviewed family psychiatric history from progress note on 12/13/2019.  Family History:  Family History  Problem Relation Age of Onset   Breast cancer Mother    Lung cancer Father    Pneumonia Father    Alcohol abuse Brother    Diabetes Brother    Heart attack Brother    COPD Brother    Insomnia Brother    Colon cancer Maternal Grandmother     Social History: I have reviewed social history from progress note on 12/13/2019. Social History   Socioeconomic History   Marital status: Married    Spouse name: Lisa Chambers   Number of children: 2   Years of education: Not on file   Highest education level: Bachelor's degree (e.g., BA, AB, BS)  Occupational History    Comment: retired    Associate Professor: retired  Tobacco Use   Smoking status: Never   Smokeless tobacco: Never  Vaping Use   Vaping status: Never Used  Substance and Sexual Activity   Alcohol use: No   Drug use: No   Sexual activity: Not Currently  Other Topics Concern   Not on file  Social History Narrative   Lives with husband   Caffeine-  diet coke 2 daily   Social Drivers of Health   Financial Resource Strain: Low Risk  (03/09/2023)   Overall Financial Resource Strain (CARDIA)    Difficulty of Paying Living Expenses: Not hard at all  Food Insecurity: No Food Insecurity (03/09/2023)   Hunger Vital Sign    Worried About Running Out of Food in the Last Year: Never true    Ran Out of Food in the Last Year: Never true  Transportation Needs: No Transportation Needs (03/09/2023)   PRAPARE - Administrator, Civil Service (Medical): No    Lack of Transportation (Non-Medical): No  Physical Activity: Insufficiently Active (03/09/2023)   Exercise Vital Sign    Days of Exercise per Week: 3 days    Minutes of Exercise per Session: 20 min  Stress: Stress Concern Present (03/09/2023)   [employer] of Occupational Health - Occupational Stress Questionnaire    Feeling of Stress : To some extent  Social Connections: Socially Integrated (03/09/2023)   Social Connection and Isolation Panel    Frequency  of Communication with Friends and Family: More than three times a week    Frequency of Social Gatherings with Friends and Family: Twice a week    Attends Religious Services: More than 4 times per year    Active Member of [employer] or Organizations: Yes    Attends Engineer, Structural: More than 4 times per year    Marital Status: Married    Allergies:  Allergies  Allergen Reactions   Aspirin Hives and Swelling    Lips only.  Lips only.  Lips only.   Sulfa Antibiotics Hives   Erythromycin Other (See Comments)    GI upset  Other reaction(s): Other (See Comments)  GI upset  GI upset  GI upset  GI upset   Levofloxacin Other (See Comments)    Joint pain    Tetracycline Other (See Comments)    GI upset  Other reaction(s): Other (See Comments)  GI upset  GI upset  GI upset  GI upset    Metabolic Disorder Labs: Lab Results  Component Value Date   HGBA1C 5.8 04/08/2023   No results found for:  PROLACTIN Lab Results  Component Value Date   CHOL 170 04/08/2023   TRIG 115.0 04/08/2023   HDL 53.30 04/08/2023   CHOLHDL 3 04/08/2023   VLDL 23.0 04/08/2023   LDLCALC 94 04/08/2023   LDLCALC 86 09/01/2022   Lab Results  Component Value Date   TSH 3.13 09/01/2022    Therapeutic Level Labs: No results found for: LITHIUM No results found for: VALPROATE No results found for: CBMZ  Current Medications: Current Outpatient Medications  Medication Sig Dispense Refill   amoxicillin-clavulanate (AUGMENTIN) 875-125 MG tablet SMARTSIG:1 Tablet(s) By Mouth Every 12 Hours     methocarbamol (ROBAXIN) 500 MG tablet Take 500-1,000 mg by mouth every 6 (six) hours as needed.     predniSONE (STERAPRED UNI-PAK 48 TAB) 5 MG (48) TBPK tablet Take by mouth as directed.     TRYPTYR 0.003 % SOLN      albuterol  (VENTOLIN  HFA) 108 (90 Base) MCG/ACT inhaler Inhale 2 puffs into the lungs every 6 (six) hours as needed for wheezing or shortness of breath. 18 g 5   buPROPion  (WELLBUTRIN  XL) 300 MG 24 hr tablet Take 1 tablet (300 mg total) by mouth in the morning. DOSE INCREASE 90 tablet 1   Cholecalciferol 25 MCG (1000 UT) capsule Take by mouth daily.     clobetasol  cream (TEMOVATE ) 0.05 % APPLY 1 APPLICATION TOPICALLY 2 (TWO) TIMES DAILY. 30 g 0   cycloSPORINE (RESTASIS) 0.05 % ophthalmic emulsion      diazepam (VALIUM) 5 MG tablet Take by mouth. (Patient not taking: Reported on 12/09/2023)     estradiol  (VIVELLE -DOT) 0.025 MG/24HR Place 1 patch onto the skin 2 (two) times a week. 8 patch 12   Fexofenadine HCl (ALLEGRA PO) Take by mouth as needed.     fluticasone (FLONASE) 50 MCG/ACT nasal spray 2 sprays daily.      fluticasone-salmeterol (ADVAIR) 250-50 MCG/ACT AEPB INHALE 1 PUFF INTO THE LUNGS EVERY 12 HOURS 60 each 12   hydrochlorothiazide (HYDRODIURIL) 25 MG tablet TAKE 1 TABLET BY MOUTH ONCE DAILY 90 tablet 0   L-Lysine 1000 MG TABS Take 1 tablet by mouth daily.     losartan  (COZAAR ) 25 MG  tablet TAKE 1 TABLET (25 MG TOTAL) BY MOUTH DAILY. 90 tablet 0   medroxyPROGESTERone  (PROVERA ) 5 MG tablet Take 1 tablet (5 mg total) by mouth daily. 30 tablet 12  montelukast (SINGULAIR) 10 MG tablet TAKE 1 TABLET BY MOUTH DAILY 90 tablet 1   Omega-3 Fatty Acids (FISH OIL) 1200 MG CAPS Take by mouth daily in the afternoon.     omeprazole  (PRILOSEC) 40 MG capsule TAKE 1 CAPSULE (40 MG TOTAL) BY MOUTH DAILY. 30 capsule 3   Probiotic Product (PROBIOTIC PO) Take by mouth daily.     triamcinolone cream (KENALOG) 0.1 % SMARTSIG:sparingly Topical Twice Daily     venlafaxine  XR (EFFEXOR -XR) 37.5 MG 24 hr capsule TAKE 1 CAPSULE (37.5 MG TOTAL) BY MOUTH DAILY WITH BREAKFAST. TAKE DAILY WITH 75 MG CAPSULE. 90 capsule 1   venlafaxine  XR (EFFEXOR -XR) 75 MG 24 hr capsule Take 1 capsule (75 mg total) by mouth 2 (two) times daily with a meal. (Patient taking differently: Take 75 mg by mouth 2 (two) times daily with a meal. Takes one a day now with the 37.5 mg) 180 capsule 3   zaleplon  (SONATA ) 10 MG capsule Take 1 capsule (10 mg total) by mouth at bedtime as needed for sleep. 30 capsule 5   No current facility-administered medications for this visit.     Musculoskeletal: Strength & Muscle Tone: UTA Gait & Station: Seated Patient leans: N/A  Psychiatric Specialty Exam: Review of Systems  Psychiatric/Behavioral:  Positive for dysphoric mood and sleep disturbance. The patient is nervous/anxious.     There were no vitals taken for this visit.There is no height or weight on file to calculate BMI.  General Appearance: Casual  Eye Contact:  Fair  Speech:  Clear and Coherent  Volume:  Normal  Mood:  Anxious and Depressed improving  Affect:  Congruent  Thought Process:  Goal Directed and Descriptions of Associations: Intact  Orientation:  Full (Time, Place, and Person)  Thought Content: Logical   Suicidal Thoughts:  No  Homicidal Thoughts:  No  Memory:  Immediate;   Fair Recent;   Fair Remote;   Fair   Judgement:  Fair  Insight:  Fair  Psychomotor Activity:  Normal  Concentration:  Concentration: Fair and Attention Span: Fair  Recall:  Lisa Chambers of Knowledge: Fair  Language: Fair  Akathisia:  No  Handed:  Right  AIMS (if indicated): not done  Assets:  Manufacturing Systems Engineer Desire for Improvement Housing Social Support Transportation  ADL's:  Intact  Cognition: WNL  Sleep:  improving   Screenings: Geneticist, Molecular Office Visit from 09/29/2021 in [facility] Office Visit from 10/01/2020 in [facility]  AIMS Total Score 0 0   GAD-7    Flowsheet Row Office Visit from 12/09/2023 in [facility] Office Visit from 10/26/2023 in [facility] Visit from 10/22/2023 in [facility] Visit from 07/21/2023 in [facility] Office Visit from 04/08/2023 in [facility]  Total GAD-7 Score 9 0 3 10 0   Mini-Mental    Flowsheet Row Office Visit from 05/22/2020 in [facility]  Total Score (max 30 points ) 29   PHQ2-9    Flowsheet Row Office Visit from 12/09/2023 in [facility] Office Visit from 10/26/2023 in [facility] [place] [facility] Visit from 10/22/2023 in [facility] Visit from 07/21/2023 in [facility]  Visit from 04[facility]08/2023 in [facility]  PHQ-2 Total Score 4 0 0 3 0  PHQ-9 Total Score 12 0 1 8 0   Flowsheet Row Video Visit from 01/12/2024 in [facility] Office Visit from 12/09/2023 in [facility] Office Visit from 09/08/2023 in [facility]  C-SSRS RISK CATEGORY No Risk No Risk No Risk     Assessment and Plan: Lisa Chambers is a 73 year old Caucasian female with history of depression, anxiety was evaluated by telemedicine today.  Discussed assessment and plan as noted below.  1. Recurrent major depressive disorder, in partial remission Currently reports improvement in her depression symptoms on the higher dosage of Wellbutrin . Continue Wellbutrin  XL 300 mg daily Continue Venlafaxine  187.5 mg daily Continue psychotherapy sessions with Lisa Chambers   2. GAD (generalized anxiety disorder)-improving Currently reports improvement in her anxiety symptoms. Continue Venlafaxine  as prescribed Continue psychotherapy sessions.  3. Primary insomnia-improving Reports sleep has improved overall since last visit. Continue sleep hygiene techniques Continue Sonata  10 mg daily at bedtime Continue CPAP use Could use Melatonin low dosage as needed. Reviewed [place] PMP AWARxE   Follow-up Follow-up in clinic in 1-1/2 months or sooner if needed.    Collaboration of Care: Collaboration of Care: Referral or follow-up with counselor/therapist AEB encouraged to continue psychotherapy sessions.  Patient/Guardian was advised Release of Information must be obtained prior to any record release in order to collaborate their care with an outside provider. Patient/Guardian was advised if they have not already done so to contact the registration department to sign all necessary forms in order for us  to release information regarding their care.   Consent: Patient/Guardian gives verbal consent for treatment and assignment of benefits for services provided during this visit. Patient/Guardian expressed understanding and agreed to proceed.  This note was generated in part or whole with voice recognition software. Voice recognition is usually quite accurate  but there are transcription errors that can and very often do occur. I apologize for any typographical errors that were not detected and corrected.     Imri Lor, MD 01/12/2024, 3:28 PM

## 2024-01-17 DIAGNOSIS — J018 Other acute sinusitis: Secondary | ICD-10-CM | POA: Diagnosis not present

## 2024-01-26 ENCOUNTER — Other Ambulatory Visit: Payer: Self-pay | Admitting: Family

## 2024-01-26 ENCOUNTER — Encounter: Payer: Self-pay | Admitting: Ophthalmology

## 2024-01-27 NOTE — Anesthesia Preprocedure Evaluation (Signed)
 Anesthesia Evaluation    Airway        Dental   Pulmonary           Cardiovascular hypertension,      Neuro/Psych    GI/Hepatic   Endo/Other    Renal/GU      Musculoskeletal   Abdominal   Peds  Hematology   Anesthesia Other Findings Medical History  GERD (gastroesophageal reflux disease) Thyroid  disease Cancer (HCC)  Asthma Hearing loss  OSA on CPAP HTN (hypertension)  Allergic rhinitis Anemia  Osteoarthritis Depression  Hx of migraine headaches Multinodular goiter  Chest pain Generalized anxiety disorder     Reproductive/Obstetrics                              Anesthesia Physical Anesthesia Plan Anesthesia Quick Evaluation

## 2024-01-31 ENCOUNTER — Encounter: Payer: Self-pay | Admitting: Family

## 2024-02-01 ENCOUNTER — Ambulatory Visit: Payer: Self-pay | Admitting: Anesthesiology

## 2024-02-01 ENCOUNTER — Encounter: Payer: Self-pay | Admitting: Ophthalmology

## 2024-02-01 ENCOUNTER — Other Ambulatory Visit: Payer: Self-pay

## 2024-02-01 ENCOUNTER — Encounter: Admission: RE | Disposition: A | Payer: Self-pay | Attending: Ophthalmology

## 2024-02-01 ENCOUNTER — Ambulatory Visit
Admission: RE | Admit: 2024-02-01 | Discharge: 2024-02-01 | Disposition: A | Discharge status: Home / Self Care | Attending: Ophthalmology | Admitting: Ophthalmology

## 2024-02-01 DIAGNOSIS — F32A Depression, unspecified: Secondary | ICD-10-CM | POA: Diagnosis not present

## 2024-02-01 DIAGNOSIS — M1990 Unspecified osteoarthritis, unspecified site: Secondary | ICD-10-CM | POA: Insufficient documentation

## 2024-02-01 DIAGNOSIS — I10 Essential (primary) hypertension: Secondary | ICD-10-CM | POA: Insufficient documentation

## 2024-02-01 DIAGNOSIS — F411 Generalized anxiety disorder: Secondary | ICD-10-CM | POA: Diagnosis not present

## 2024-02-01 DIAGNOSIS — K219 Gastro-esophageal reflux disease without esophagitis: Secondary | ICD-10-CM | POA: Diagnosis not present

## 2024-02-01 DIAGNOSIS — G4733 Obstructive sleep apnea (adult) (pediatric): Secondary | ICD-10-CM | POA: Diagnosis not present

## 2024-02-01 DIAGNOSIS — H2511 Age-related nuclear cataract, right eye: Secondary | ICD-10-CM | POA: Diagnosis present

## 2024-02-01 HISTORY — DX: Generalized anxiety disorder: F41.1

## 2024-02-01 HISTORY — PX: CATARACT EXTRACTION W/PHACO: SHX586

## 2024-02-01 SURGERY — PHACOEMULSIFICATION, CATARACT, WITH IOL INSERTION
Anesthesia: Monitor Anesthesia Care | Site: Eye | Laterality: Right

## 2024-02-01 MED ORDER — CYCLOPENTOLATE HCL 2 % OP SOLN
OPHTHALMIC | Status: AC
  Filled 2024-02-01: qty 2

## 2024-02-01 MED ORDER — PHENYLEPHRINE HCL 10 % OP SOLN
OPHTHALMIC | Status: AC
  Filled 2024-02-01: qty 5

## 2024-02-01 MED ORDER — PHENYLEPHRINE HCL 10 % OP SOLN
1.0000 [drp] | OPHTHALMIC | Status: DC | PRN
  Administered 2024-02-01 (×2): 1 [drp] via OPHTHALMIC

## 2024-02-01 MED ORDER — LACTATED RINGERS IV SOLN: INTRAVENOUS | Status: DC

## 2024-02-01 MED ORDER — LIDOCAINE HCL (PF) 2 % IJ SOLN
INTRAOCULAR | Status: DC | PRN
  Administered 2024-02-01: 2 mL

## 2024-02-01 MED ORDER — FENTANYL CITRATE (PF) 100 MCG/2ML IJ SOLN
INTRAMUSCULAR | Status: DC | PRN
  Administered 2024-02-01 (×2): 50 ug via INTRAVENOUS

## 2024-02-01 MED ORDER — SIGHTPATH DOSE#1 NA CHONDROIT SULF-NA HYALURON 40-17 MG/ML IO SOLN
INTRAOCULAR | Status: DC | PRN
  Administered 2024-02-01: 1 mL via INTRAOCULAR

## 2024-02-01 MED ORDER — MOXIFLOXACIN HCL 0.5 % OP SOLN
OPHTHALMIC | Status: DC | PRN
  Administered 2024-02-01: .2 mL via OPHTHALMIC

## 2024-02-01 MED ORDER — CYCLOPENTOLATE HCL 2 % OP SOLN
1.0000 [drp] | OPHTHALMIC | Status: DC | PRN
  Administered 2024-02-01 (×2): 1 [drp] via OPHTHALMIC

## 2024-02-01 MED ORDER — BRIMONIDINE TARTRATE-TIMOLOL 0.2-0.5 % OP SOLN
OPHTHALMIC | Status: DC | PRN
  Administered 2024-02-01: 1 [drp] via OPHTHALMIC

## 2024-02-01 MED ORDER — TETRACAINE HCL 0.5 % OP SOLN
1.0000 [drp] | OPHTHALMIC | Status: DC | PRN
  Administered 2024-02-01 (×3): 1 [drp] via OPHTHALMIC

## 2024-02-01 MED ORDER — SIGHTPATH DOSE#1 BSS IO SOLN
INTRAOCULAR | Status: DC | PRN
  Administered 2024-02-01: 54 mL via OPHTHALMIC

## 2024-02-01 MED ORDER — FENTANYL CITRATE (PF) 100 MCG/2ML IJ SOLN
INTRAMUSCULAR | Status: AC
  Filled 2024-02-01: qty 2

## 2024-02-01 MED ORDER — TETRACAINE HCL 0.5 % OP SOLN
OPHTHALMIC | Status: AC
  Filled 2024-02-01: qty 4

## 2024-02-01 MED ORDER — SIGHTPATH DOSE#1 BSS IO SOLN
INTRAOCULAR | Status: DC | PRN
  Administered 2024-02-01: 15 mL via INTRAOCULAR

## 2024-02-01 MED ORDER — MIDAZOLAM HCL (PF) 2 MG/2ML IJ SOLN
INTRAMUSCULAR | Status: DC | PRN
  Administered 2024-02-01: 2 mg via INTRAVENOUS

## 2024-02-01 MED ORDER — MIDAZOLAM HCL 2 MG/2ML IJ SOLN
INTRAMUSCULAR | Status: AC
  Filled 2024-02-01: qty 2

## 2024-02-01 SURGICAL SUPPLY — 11 items
CANNULA ANT/CHMB 27G (MISCELLANEOUS) ×1 IMPLANT
CYSTOTOME ANGL RVRS SHRT 25G (CUTTER) ×1 IMPLANT
Clareon PanOptix Pro (Intraocular Lens) IMPLANT
FEE CATARACT SUITE SIGHTPATH (MISCELLANEOUS) ×1 IMPLANT
GLOVE BIOGEL PI IND STRL 8 (GLOVE) ×1 IMPLANT
GLOVE SURG LX STRL 8.0 MICRO (GLOVE) ×1 IMPLANT
GLOVE SURG SYN 6.5 PF PI BL (GLOVE) ×1 IMPLANT
LENS IOL PANO PRO 18.0 IMPLANT
NDL FILTER BLUNT 18X1 1/2 (NEEDLE) ×1 IMPLANT
NEEDLE FILTER BLUNT 18X1 1/2 (NEEDLE) ×1 IMPLANT
SYR 3ML LL SCALE MARK (SYRINGE) ×1 IMPLANT
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
## 2024-02-01 NOTE — Transfer of Care (Signed)
 Immediate Anesthesia Transfer of Care Note  Patient: Lisa Chambers  Procedure(s) Performed: PHACOEMULSIFICATION, CATARACT, WITH IOL INSERTION 8.07 00:51.7 (Right: Eye)  Patient Location: PACU  Anesthesia Type: MAC  Level of Consciousness: awake, alert  and patient cooperative  Airway and Oxygen Therapy: Patient Spontanous Breathing and Patient connected to supplemental oxygen  Post-op Assessment: Post-op Vital signs reviewed, Patient's Cardiovascular Status Stable, Respiratory Function Stable, Patent Airway and No signs of Nausea or vomiting  Post-op Vital Signs: Reviewed and stable  Complications: No notable events documented.

## 2024-02-01 NOTE — Op Note (Signed)
 PREOPERATIVE DIAGNOSIS:  Nuclear sclerotic cataract of the right eye.   POSTOPERATIVE DIAGNOSIS:  Right Eye Cataract   OPERATIVE PROCEDURE:ORPROCALL@   SURGEON:  Elsie Carmine, MD.   ANESTHESIA:  Anesthesiologist: Ola Donny BROCKS, MD CRNA: Myra Lawless, CRNA  1.      Managed anesthesia care. 2.      0.38ml of Shugarcaine was instilled in the eye following the paracentesis.   COMPLICATIONS:  None.   TECHNIQUE:   Stop and chop   DESCRIPTION OF PROCEDURE:  The patient was examined and consented in the preoperative holding area where the aforementioned topical anesthesia was applied to the right eye and then brought back to the Operating Room where the right eye was prepped and draped in the usual sterile ophthalmic fashion and a lid speculum was placed. A paracentesis was created with the side port blade and the anterior chamber was filled with viscoelastic. A near clear corneal incision was performed with the steel keratome. A continuous curvilinear capsulorrhexis was performed with a cystotome followed by the capsulorrhexis forceps. Hydrodissection and hydrodelineation were carried out with BSS on a blunt cannula. The lens was removed in a stop and chop  technique and the remaining cortical material was removed with the irrigation-aspiration handpiece. The capsular bag was inflated with viscoelastic and the intraocular lens was placed in the capsular bag without complication. The remaining viscoelastic was removed from the eye with the irrigation-aspiration handpiece. The wounds were hydrated. The anterior chamber was flushed with BSS and the eye was inflated to physiologic pressure. 0.1ml of Vigamox  was placed in the anterior chamber. The wounds were found to be water tight. The eye was dressed with Combigan . The patient was given protective glasses to wear throughout the day and a shield with which to sleep tonight. The patient was also given drops with which to begin a drop regimen today  and will follow-up with me in one day. Implant Name Type Inv. Item Serial No. Manufacturer Lot No. LRB No. Used Action  [employer] PanOptix Pro Intraocular Lens  [id number] SIGHT[employer]  Right 1 Implanted   Procedures: PHACOEMULSIFICATION, CATARACT, WITH IOL INSERTION 8.07 00:51.7 (Right)  Electronically signed: Elsie Carmine 02/01/2024 12:52 PM

## 2024-02-04 ENCOUNTER — Encounter: Payer: Self-pay | Admitting: Ophthalmology

## 2024-02-04 NOTE — Anesthesia Postprocedure Evaluation (Signed)
"   Anesthesia Post Note  Patient: TATYM SCHERMER  Procedure(s) Performed: PHACOEMULSIFICATION, CATARACT, WITH IOL INSERTION 8.07 00:51.7 (Right: Eye)  Patient location during evaluation: PACU Anesthesia Type: MAC Level of consciousness: awake and alert Pain management: pain level controlled Vital Signs Assessment: post-procedure vital signs reviewed and stable Respiratory status: spontaneous breathing, nonlabored ventilation, respiratory function stable and patient connected to nasal cannula oxygen Cardiovascular status: stable and blood pressure returned to baseline Postop Assessment: no apparent nausea or vomiting Anesthetic complications: no   No notable events documented.   Last Vitals:  Vitals:   02/01/24 1255 02/01/24 1259  BP: 131/82   Pulse: 78 72  Resp: 15 14  Temp: 36.8 C 36.8 C  SpO2: 96% 95%    Last Pain:  Vitals:   02/04/24 1005  TempSrc:   PainSc: 0-No pain                 Valentine Barney C Youssef Footman      "

## 2024-02-07 ENCOUNTER — Encounter: Payer: Self-pay | Admitting: Family

## 2024-02-07 NOTE — Anesthesia Preprocedure Evaluation (Addendum)
 "                                  Anesthesia Evaluation  Patient identified by MRN, date of birth, ID band Patient awake    Reviewed: Allergy & Precautions, H&P , NPO status , Patient's Chart, lab work & pertinent test results  Airway Mallampati: II  TM Distance: <3 FB Neck ROM: Full    Dental no notable dental hx. (+) Teeth Intact, Implants Has implant right upper pre-molar or molar, not sure which Has beautiful perfect teeth:  :   Pulmonary neg pulmonary ROS, asthma , sleep apnea    Pulmonary exam normal breath sounds clear to auscultation       Cardiovascular hypertension, negative cardio ROS Normal cardiovascular exam Rhythm:Regular Rate:Normal     Neuro/Psych  PSYCHIATRIC DISORDERS Anxiety Depression    negative neurological ROS  negative psych ROS   GI/Hepatic negative GI ROS, Neg liver ROS,GERD  ,,  Endo/Other  negative endocrine ROS    Renal/GU      Musculoskeletal   Abdominal   Peds  Hematology negative hematology ROS (+) Blood dyscrasia, anemia   Anesthesia Other Findings Past Medical History: No date: Allergic rhinitis No date: Anemia No date: Asthma No date: Cancer [facility])     Comment:  skin No date: Chest pain     Comment:  a. 01/2020 MV: EF 70%, no isch/infarct. CT attenuation               correction images w/ minimal Ao athersclerosis, no               significant cor Ca2+. No date: Depression No date: Generalized anxiety disorder No date: GERD (gastroesophageal reflux disease) No date: Hearing loss No date: HTN (hypertension) No date: Hx of migraine headaches No date: Multinodular goiter No date: OSA on CPAP No date: Osteoarthritis No date: Thyroid  disease  Past Surgical History: 2021: BREAST BIOPSY; Right     Comment:  fibroadenoma, benign 02/01/2024: CATARACT EXTRACTION W/PHACO; Right     Comment:  Procedure: PHACOEMULSIFICATION, CATARACT, WITH IOL               INSERTION 8.07 00:51.7;  Surgeon: Jaye Fallow,  MD;               Location: [facility];  Service: Ophthalmology;                Laterality: Right; No date: CHOLECYSTECTOMY 10/20/2022: COLONOSCOPY WITH PROPOFOL ; N/A     Comment:  Procedure: COLONOSCOPY WITH PROPOFOL ;  Surgeon: Toledo,               Ladell POUR, MD;  Location: [facility] ENDOSCOPY;  Service:               Gastroenterology;  Laterality: N/A; 10/20/2022: HEMOSTASIS CLIP PLACEMENT     Comment:  Procedure: HEMOSTASIS CLIP PLACEMENT;  Surgeon: Aundria,               Ladell POUR, MD;  Location: [facility] ENDOSCOPY;  Service:               Gastroenterology;; 1996, 2007: NASAL SINUS SURGERY 10/20/2022: POLYPECTOMY     Comment:  Procedure: POLYPECTOMY;  Surgeon: Aundria, Ladell POUR, MD;              Location: [facility] ENDOSCOPY;  Service: Gastroenterology;; 2017: REPLACEMENT TOTAL KNEE; Bilateral 1994: TUBAL LIGATION  BMI  Body Mass Index: 34.88 kg/m      Reproductive/Obstetrics negative OB ROS                              Anesthesia Physical Anesthesia Plan  ASA: 2  Anesthesia Plan: MAC   Post-op Pain Management:    Induction: Intravenous  PONV Risk Score and Plan:   Airway Management Planned: Natural Airway and Nasal Cannula  Additional Equipment:   Intra-op Plan:   Post-operative Plan:   Informed Consent: I have reviewed the patients History and Physical, chart, labs and discussed the procedure including the risks, benefits and alternatives for the proposed anesthesia with the patient or authorized representative who has indicated his/her understanding and acceptance.     Dental Advisory Given  Plan Discussed with: Anesthesiologist, CRNA and Surgeon  Anesthesia Plan Comments: (Patient consented for risks of anesthesia including but not limited to:  - adverse reactions to medications - damage to eyes, teeth, lips or other oral mucosa - nerve damage due to positioning  - sore throat or hoarseness - Damage to heart, brain, nerves,  lungs, other parts of body or loss of life  Patient voiced understanding and assent.)         Anesthesia Quick Evaluation  "

## 2024-02-14 ENCOUNTER — Other Ambulatory Visit: Payer: Self-pay | Admitting: Family

## 2024-02-14 DIAGNOSIS — F3342 Major depressive disorder, recurrent, in full remission: Secondary | ICD-10-CM

## 2024-02-15 ENCOUNTER — Other Ambulatory Visit: Payer: Self-pay

## 2024-02-15 ENCOUNTER — Encounter: Payer: Self-pay | Admitting: Ophthalmology

## 2024-02-15 ENCOUNTER — Ambulatory Visit: Payer: Self-pay | Admitting: Anesthesiology

## 2024-02-15 ENCOUNTER — Ambulatory Visit
Admission: RE | Admit: 2024-02-15 | Discharge: 2024-02-15 | Disposition: A | Discharge status: Home / Self Care | Attending: Ophthalmology | Admitting: Ophthalmology

## 2024-02-15 ENCOUNTER — Ambulatory Visit: Admitting: Professional Counselor

## 2024-02-15 ENCOUNTER — Encounter
Admission: RE | Disposition: A | Discharge status: Home / Self Care | Payer: Self-pay | Source: Home / Self Care | Attending: Ophthalmology

## 2024-02-15 ENCOUNTER — Ambulatory Visit

## 2024-02-15 DIAGNOSIS — G4733 Obstructive sleep apnea (adult) (pediatric): Secondary | ICD-10-CM | POA: Diagnosis not present

## 2024-02-15 DIAGNOSIS — J45909 Unspecified asthma, uncomplicated: Secondary | ICD-10-CM | POA: Diagnosis not present

## 2024-02-15 DIAGNOSIS — I10 Essential (primary) hypertension: Secondary | ICD-10-CM | POA: Diagnosis not present

## 2024-02-15 DIAGNOSIS — K219 Gastro-esophageal reflux disease without esophagitis: Secondary | ICD-10-CM | POA: Insufficient documentation

## 2024-02-15 DIAGNOSIS — H2512 Age-related nuclear cataract, left eye: Secondary | ICD-10-CM | POA: Insufficient documentation

## 2024-02-15 HISTORY — PX: CATARACT EXTRACTION W/PHACO: SHX586

## 2024-02-15 SURGERY — PHACOEMULSIFICATION, CATARACT, WITH IOL INSERTION
Anesthesia: Monitor Anesthesia Care | Laterality: Left

## 2024-02-15 MED ORDER — SIGHTPATH DOSE#1 NA CHONDROIT SULF-NA HYALURON 40-17 MG/ML IO SOLN
INTRAOCULAR | Status: DC | PRN
  Administered 2024-02-15: 1 mL via INTRAOCULAR

## 2024-02-15 MED ORDER — PHENYLEPHRINE HCL 10 % OP SOLN
OPHTHALMIC | Status: AC
  Filled 2024-02-15: qty 5

## 2024-02-15 MED ORDER — LIDOCAINE HCL (PF) 2 % IJ SOLN
INTRAOCULAR | Status: DC | PRN
  Administered 2024-02-15: 1 mL

## 2024-02-15 MED ORDER — MIDAZOLAM HCL (PF) 2 MG/2ML IJ SOLN
INTRAMUSCULAR | Status: DC | PRN
  Administered 2024-02-15 (×2): 1 mg via INTRAVENOUS

## 2024-02-15 MED ORDER — TETRACAINE HCL 0.5 % OP SOLN
1.0000 [drp] | OPHTHALMIC | Status: DC | PRN
  Administered 2024-02-15 (×3): 1 [drp] via OPHTHALMIC

## 2024-02-15 MED ORDER — MOXIFLOXACIN HCL 0.5 % OP SOLN
OPHTHALMIC | Status: DC | PRN
  Administered 2024-02-15: .2 mL via OPHTHALMIC

## 2024-02-15 MED ORDER — SIGHTPATH DOSE#1 BSS IO SOLN
INTRAOCULAR | Status: DC | PRN
  Administered 2024-02-15: 15 mL via INTRAOCULAR

## 2024-02-15 MED ORDER — CYCLOPENTOLATE HCL 2 % OP SOLN
OPHTHALMIC | Status: AC
  Filled 2024-02-15: qty 2

## 2024-02-15 MED ORDER — PHENYLEPHRINE HCL 10 % OP SOLN
1.0000 [drp] | OPHTHALMIC | Status: AC | PRN
  Administered 2024-02-15 (×3): 1 [drp] via OPHTHALMIC

## 2024-02-15 MED ORDER — SIGHTPATH DOSE#1 BSS IO SOLN
INTRAOCULAR | Status: DC | PRN
  Administered 2024-02-15: 51 mL via OPHTHALMIC

## 2024-02-15 MED ORDER — CYCLOPENTOLATE HCL 2 % OP SOLN
1.0000 [drp] | OPHTHALMIC | Status: AC | PRN
  Administered 2024-02-15 (×3): 1 [drp] via OPHTHALMIC

## 2024-02-15 MED ORDER — MIDAZOLAM HCL 2 MG/2ML IJ SOLN
INTRAMUSCULAR | Status: AC
  Filled 2024-02-15: qty 2

## 2024-02-15 MED ORDER — LACTATED RINGERS IV SOLN: INTRAVENOUS | Status: DC

## 2024-02-15 MED ORDER — TETRACAINE HCL 0.5 % OP SOLN
OPHTHALMIC | Status: AC
  Filled 2024-02-15: qty 4

## 2024-02-15 MED ORDER — BRIMONIDINE TARTRATE-TIMOLOL 0.2-0.5 % OP SOLN
OPHTHALMIC | Status: DC | PRN
  Administered 2024-02-15: 1 [drp] via OPHTHALMIC

## 2024-02-15 SURGICAL SUPPLY — 9 items
CANNULA ANT/CHMB 27G (MISCELLANEOUS) ×1 IMPLANT
CYSTOTOME ANGL RVRS SHRT 25G (CUTTER) ×1 IMPLANT
FEE CATARACT SUITE SIGHTPATH (MISCELLANEOUS) ×1 IMPLANT
GLOVE BIOGEL PI IND STRL 8 (GLOVE) ×1 IMPLANT
GLOVE SURG LX STRL 8.0 MICRO (GLOVE) ×1 IMPLANT
GLOVE SURG SYN 6.5 PF PI BL (GLOVE) ×1 IMPLANT
LENS IOL PANO PRO 20.0 IMPLANT
NEEDLE FILTER BLUNT 18X1 1/2 (NEEDLE) ×1 IMPLANT
SYR 3ML LL SCALE MARK (SYRINGE) ×1 IMPLANT
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
## 2024-02-15 NOTE — Transfer of Care (Signed)
 Immediate Anesthesia Transfer of Care Note  Patient: Lisa Chambers  Procedure(s) Performed: PHACOEMULSIFICATION, CATARACT, WITH IOL INSERTION 5.85, 00:41.9 (Left)  Patient Location: PACU  Anesthesia Type: MAC  Level of Consciousness: awake, alert  and patient cooperative  Airway and Oxygen Therapy: Patient Spontanous Breathing and Patient connected to supplemental oxygen  Post-op Assessment: Post-op Vital signs reviewed, Patient's Cardiovascular Status Stable, Respiratory Function Stable, Patent Airway and No signs of Nausea or vomiting  Post-op Vital Signs: Reviewed and stable  Complications: No notable events documented.

## 2024-02-15 NOTE — Op Note (Signed)
 PREOPERATIVE DIAGNOSIS:  Nuclear sclerotic cataract of the left eye.   POSTOPERATIVE DIAGNOSIS:  Nuclear sclerotic cataract of the left eye.   OPERATIVE PROCEDURE:ORPROCALL@   SURGEON:  Lisa Carmine, MD.   ANESTHESIA:  Anesthesiologist: Leavy Ned, MD CRNA: Pearl Ozell PARAS, CRNA; Jaylene Nest, CRNA  1.      Managed anesthesia care. 2.     0.81ml of Shugarcaine was instilled following the paracentesis   COMPLICATIONS:  None.   TECHNIQUE:   Stop and chop   DESCRIPTION OF PROCEDURE:  The patient was examined and consented in the preoperative holding area where the aforementioned topical anesthesia was applied to the left eye and then brought back to the Operating Room where the left eye was prepped and draped in the usual sterile ophthalmic fashion and a lid speculum was placed. A paracentesis was created with the side port blade and the anterior chamber was filled with viscoelastic. A near clear corneal incision was performed with the steel keratome. A continuous curvilinear capsulorrhexis was performed with a cystotome followed by the capsulorrhexis forceps. Hydrodissection and hydrodelineation were carried out with BSS on a blunt cannula. The lens was removed in a stop and chop  technique and the remaining cortical material was removed with the irrigation-aspiration handpiece. The capsular bag was inflated with viscoelastic and the intraocular lens was placed in the capsular bag without complication. The remaining viscoelastic was removed from the eye with the irrigation-aspiration handpiece. The wounds were hydrated. The anterior chamber was flushed with BSS and the eye was inflated to physiologic pressure. 0.51ml Vigamox  was placed in the anterior chamber. The wounds were found to be water tight. The eye was dressed with Combigan . The patient was given protective glasses to wear throughout the day and a shield with which to sleep tonight. The patient was also given drops with which  to begin a drop regimen today and will follow-up with me in one day. Implant Name Type Inv. Item Serial No. Manufacturer Lot No. LRB No. Used Action  LENS IOL PANO PRO 20.0 - [id number]  LENS IOL PANO PRO 20.0 [id number] [employer]  Left 1 Implanted    Procedures: PHACOEMULSIFICATION, CATARACT, WITH IOL INSERTION 5.85, 00:41.9 (Left)  Electronically signed: Elsie Chambers 02/15/2024 8:43 AM

## 2024-02-15 NOTE — Discharge Instructions (Signed)

## 2024-02-15 NOTE — Anesthesia Postprocedure Evaluation (Signed)
"   Anesthesia Post Note  Patient: Lisa Chambers  Procedure(s) Performed: PHACOEMULSIFICATION, CATARACT, WITH IOL INSERTION 5.85, 00:41.9 (Left)  Patient location during evaluation: PACU Anesthesia Type: MAC Level of consciousness: awake and alert Pain management: pain level controlled Vital Signs Assessment: post-procedure vital signs reviewed and stable Respiratory status: spontaneous breathing, nonlabored ventilation, respiratory function stable and patient connected to nasal cannula oxygen Cardiovascular status: stable and blood pressure returned to baseline Postop Assessment: no apparent nausea or vomiting Anesthetic complications: no   No notable events documented.   Last Vitals:  Vitals:   02/15/24 0733 02/15/24 0845  BP: 123/78 112/71  Pulse:  72  Resp:  10  Temp: 37.2 C   SpO2: 97% 95%    Last Pain:  Vitals:   02/15/24 0845  TempSrc:   PainSc: 0-No pain                 Debby Mines      "

## 2024-02-16 ENCOUNTER — Encounter: Payer: Self-pay | Admitting: Ophthalmology

## 2024-02-16 ENCOUNTER — Ambulatory Visit: Admitting: Professional Counselor

## 2024-02-18 ENCOUNTER — Ambulatory Visit

## 2024-02-22 ENCOUNTER — Encounter: Payer: Self-pay | Admitting: Ophthalmology

## 2024-02-23 ENCOUNTER — Telehealth (INDEPENDENT_AMBULATORY_CARE_PROVIDER_SITE_OTHER): Admitting: Psychiatry

## 2024-02-23 ENCOUNTER — Ambulatory Visit

## 2024-02-23 DIAGNOSIS — F3341 Major depressive disorder, recurrent, in partial remission: Secondary | ICD-10-CM

## 2024-02-23 NOTE — Progress Notes (Signed)
 Patient had connection problem.  Patient agrees to reschedule.  Communicated with staff.

## 2024-02-24 ENCOUNTER — Other Ambulatory Visit: Payer: Self-pay | Admitting: Family

## 2024-02-24 ENCOUNTER — Ambulatory Visit (INDEPENDENT_AMBULATORY_CARE_PROVIDER_SITE_OTHER): Admitting: Professional Counselor

## 2024-02-24 ENCOUNTER — Ambulatory Visit: Admitting: Family

## 2024-02-24 DIAGNOSIS — F411 Generalized anxiety disorder: Secondary | ICD-10-CM | POA: Diagnosis not present

## 2024-02-24 DIAGNOSIS — F3341 Major depressive disorder, recurrent, in partial remission: Secondary | ICD-10-CM
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
## 2024-03-02 ENCOUNTER — Encounter: Payer: Self-pay | Admitting: Family

## 2024-03-02 ENCOUNTER — Ambulatory Visit: Admitting: Family

## 2024-03-02 ENCOUNTER — Ambulatory Visit: Payer: Self-pay | Admitting: Family

## 2024-03-02 VITALS — BP 118/84 | HR 78 | Temp 98.0°F | Ht 65.0 in | Wt 209.0 lb

## 2024-03-02 DIAGNOSIS — J309 Allergic rhinitis, unspecified: Secondary | ICD-10-CM

## 2024-03-02 DIAGNOSIS — R0989 Other specified symptoms and signs involving the circulatory and respiratory systems: Secondary | ICD-10-CM | POA: Diagnosis not present

## 2024-03-02 DIAGNOSIS — E042 Nontoxic multinodular goiter: Secondary | ICD-10-CM | POA: Diagnosis not present

## 2024-03-02 DIAGNOSIS — Z20822 Contact with and (suspected) exposure to covid-19: Secondary | ICD-10-CM

## 2024-03-02 DIAGNOSIS — E782 Mixed hyperlipidemia: Secondary | ICD-10-CM

## 2024-03-02 DIAGNOSIS — R7303 Prediabetes: Secondary | ICD-10-CM | POA: Diagnosis not present

## 2024-03-02 DIAGNOSIS — R809 Proteinuria, unspecified: Secondary | ICD-10-CM | POA: Diagnosis not present

## 2024-03-02 DIAGNOSIS — J029 Acute pharyngitis, unspecified: Secondary | ICD-10-CM

## 2024-03-02 DIAGNOSIS — E538 Deficiency of other specified B group vitamins: Secondary | ICD-10-CM

## 2024-03-02 DIAGNOSIS — I10 Essential (primary) hypertension: Secondary | ICD-10-CM

## 2024-03-02 DIAGNOSIS — Z111 Encounter for screening for respiratory tuberculosis: Secondary | ICD-10-CM

## 2024-03-02 DIAGNOSIS — J069 Acute upper respiratory infection, unspecified: Secondary | ICD-10-CM

## 2024-03-02 LAB — BASIC METABOLIC PANEL WITH GFR
BUN: 18 mg/dL (ref 6–23)
CO2: 30 meq/L (ref 19–32)
Calcium: 9.7 mg/dL (ref 8.4–10.5)
Chloride: 99 meq/L (ref 96–112)
Creatinine, Ser: 0.69 mg/dL (ref 0.40–1.20)
GFR: 87.45 mL/min
Glucose, Bld: 85 mg/dL (ref 70–99)
Potassium: 4.2 meq/L (ref 3.5–5.1)
Sodium: 137 meq/L (ref 135–145)

## 2024-03-02 LAB — T4, FREE: Free T4: 0.9 ng/dL (ref 0.60–1.60)

## 2024-03-02 LAB — TSH: TSH: 0.42 u[IU]/mL (ref 0.35–5.50)

## 2024-03-02 LAB — HEMOGLOBIN A1C: Hgb A1c MFr Bld: 5.7 % (ref 4.6–6.5)

## 2024-03-02 LAB — MICROALBUMIN / CREATININE URINE RATIO
Creatinine,U: 54.2 mg/dL
Microalb Creat Ratio: UNDETERMINED mg/g (ref 0.0–30.0)
Microalb, Ur: <0.7 mg/dL

## 2024-03-02 LAB — POCT RAPID STREP A (OFFICE): Rapid Strep A Screen: NEGATIVE

## 2024-03-02 LAB — VITAMIN B12: Vitamin B-12: 531 pg/mL (ref 211–911)

## 2024-03-02 LAB — POC COVID19 BINAXNOW: SARS Coronavirus 2 Ag: NEGATIVE

## 2024-03-02 LAB — POCT INFLUENZA A/B
Influenza A, POC: NEGATIVE
Influenza B, POC: NEGATIVE

## 2024-03-02 LAB — T3, FREE: T3, Free: 3.3 pg/mL (ref 2.3–4.2)

## 2024-03-02 NOTE — Progress Notes (Signed)
 "  Established Patient Office Visit  Subjective:      CC:  Chief Complaint  Patient presents with   Medical Management of Chronic Issues    HPI: Lisa Chambers is a 74 y.o. female presenting on 03/02/2024 for Medical Management of Chronic Issues .  Discussed the use of AI scribe software for clinical note transcription with the patient, who gave verbal consent to proceed.  History of Present Illness Lisa Chambers is a 74 year old female who presents with symptoms of a possible viral infection and requests a TB test for employment purposes.  She has been feeling unwell since this morning, describing her head as feeling 'like a balloon' and experiencing body aches, particularly in her shoulders. She has a slight sore throat on one side. She attended a painting class with about ten other women the day before, which she mentions as a potential exposure. No fever, sinus pressure, or congestion, but occasional nasal dripping is noted.  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 She is up to date on her vaccinations, including tetanus, MMR, and hepatitis B.  She has a history of cataract surgery in both eyes, resulting in 20/25 vision in both eyes. She may need glasses for distance vision but is pleased with her improved night vision for driving. She uses hearing aids and has no issues with hearing or vision that affect her daily activities.  She experiences allergy symptoms and has had two sinus infections in the past. She typically takes Allegra for allergies but has not been taking it recently. She reports that her right ear sometimes causes pain, and she visits an ENT regularly.  She has a history of being mildly prediabetic and is due for a recheck of her B12 levels and urine microalbumin. She does not take cholesterol medication and has been attending [employer], resulting in a 15-pound weight  loss.   Completed TB risk questionnaire as follows:  Were you born outside [place]  in [place], [place], [place], [place] or [place]? No.  Have you traveled outside of [place]  and lived for more than one month? No.   Do you have a compromised immune system such as from the following: HIV/AIDs, organ or bone  marrow transplant, diabetes, immunosuppressive medications, leukemia, lymphoma, cancer of head or neck, gastrectomy, or jejunal bypass, end stage renal disease on dialysis or silicosis? No.  Have you ever done one of the following? Used crack cocaine, injected illegal drugs, worked or resided in jail or prison, worked or resided at a homeless shelter, or worked as a research scientist (physical sciences) in careers information officer with patients? No.  Have you ever been exposed to anyone with infectious tb? No.  Completed TB symptom questionnaire as follows:   Do you currently have any of the following symptoms?  Unexplained cough more than 3 weeks? No. Unexplained fever lasting more than 3 weeks? No. Night sweats, that are leaving bedclothes and sheets wet? No. Shortness of breath? No. Chest pain? No. Unintentional weight loss? No. Unexplained fatigued? No.       Social history:  Relevant past medical, surgical, family and social history reviewed and updated as indicated. Interim medical history since our last visit reviewed.  Allergies and medications reviewed and updated.  DATA REVIEWED: CHART IN EPIC     ROS: Negative unless specifically indicated above in HPI.   Current Medications[1]  Objective:        BP 118/84 (BP Location: Left Arm, Patient Position: Sitting, Cuff Size: Large)   Pulse 78   Temp 98 F (36.7 C) (Temporal)   Ht 5' 5 (1.651 m)   Wt 209 lb (94.8 kg)   SpO2 98%   BMI 34.78 kg/m   Physical Exam VITALS: BP- 118/84 HEENT: Nasal tenderness present. Fluid in right eardrum, clear. Mild sinus tenderness.  Wt Readings from Last 3 Encounters:   03/02/24 209 lb (94.8 kg)  02/15/24 206 lb 4.8 oz (93.6 kg)  02/01/24 207 lb 4.8 oz (94 kg)    Physical Exam Vitals reviewed.  Constitutional:      General: She is not in acute distress.    Appearance: Normal appearance. She is normal weight. She is not ill-appearing, toxic-appearing or diaphoretic.  HENT:     Head: Normocephalic.     Right Ear: Tympanic membrane normal.     Left Ear: Tympanic membrane normal.     Nose: Nose normal.     Mouth/Throat:     Mouth: Mucous membranes are dry.     Pharynx: No oropharyngeal exudate or posterior oropharyngeal erythema.  Eyes:     Extraocular Movements: Extraocular movements intact.     Pupils: Pupils are equal, round, and reactive to light.  Cardiovascular:     Rate and Rhythm: Normal rate and regular rhythm.     Pulses: Normal pulses.     Heart sounds: Normal heart sounds.  Pulmonary:     Effort: Pulmonary effort is normal.     Breath sounds: Normal breath sounds.  Musculoskeletal:     Cervical back: Normal range of motion.  Neurological:     General: No focal deficit present.     Mental Status: She is alert and oriented to person, place, and time. Mental status is at baseline.  Psychiatric:        Mood and Affect: Mood normal.        Behavior: Behavior normal.        Thought Content: Thought content normal.        Judgment: Judgment normal.    Wt Readings from Last 3 Encounters:  03/02/24 209 lb (94.8 kg)  02/15/24 206 lb 4.8 oz (93.6 kg)  02/01/24 207 lb 4.8 oz (94 kg)           Results   Assessment & Plan:   Assessment and Plan Assessment & Plan Screening for tuberculosis Requires TB skin test for substitute teaching application. No symptoms suggestive of active TB such as night sweats, persistent fever, or chronic cough. Discussed TB transmission risks and skin test necessity. - Scheduled TB skin test - Scheduled nurse visit for skin test reading in 48 hours  Acute viral upper respiratory  infection Reports headache, fullness of the head, and body aches, particularly in shoulders. No fever. Sore throat on one side. No known exposure to COVID, flu, or strep. Recent attendance at a painting class with multiple people. Differential includes viral infection such as flu, COVID, or strep. - Performed swab for flu, COVID, and strep  Low serum vitamin B12 Previous testing for B12 levels was done last February. Plan to recheck levels as part of routine monitoring. - Ordered B12 level test  Prediabetes Previously mildly prediabetic. - Ordered urine microalbumin test  Microalbuminuria Requires repeat testing as part of routine monitoring. - Ordered urine microalbumin test  Hypertension Blood pressure is well-controlled at 118/84 mmHg.  Mixed hyperlipidemia Cholesterol levels are well-controlled  without medication.  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 No endocrinologist follow-up required unless symptoms develop. - Ordered thyroid  function tests  Allergic rhinitis Reports occasional sinus infections and fluid in the right eardrum. Uses Allegra as needed. - Continue Allegra as needed - Use nasal rinse if drainage occurs -covid strep flu negative suspected viral infection  Advised patient on supportive measures:  Be sure to rest, drink plenty of fluids, and use tylenol or ibuprofen as needed for pain. Follow up if fever >101, if symptoms worsen or if symptoms are not improved in 3 days. Patient verbalizes understanding.         Return in about 6 months (around 08/30/2024) for f/u CPE.     Ginger Patrick, MSN, APRN, FNP-C [facility]        [1]  Current Outpatient Medications:    albuterol  (VENTOLIN  HFA) 108 (90 Base) MCG/ACT inhaler, Inhale 2 puffs into the lungs every 6 (six) hours as needed for wheezing or shortness of breath., Disp: 18 g, Rfl: 5   buPROPion  (WELLBUTRIN  XL) 300 MG 24 hr tablet, Take 1  tablet (300 mg total) by mouth in the morning. DOSE INCREASE, Disp: 90 tablet, Rfl: 1   Cholecalciferol 25 MCG (1000 UT) capsule, Take by mouth daily., Disp: , Rfl:    clobetasol  cream (TEMOVATE ) 0.05 %, APPLY 1 APPLICATION TOPICALLY 2 (TWO) TIMES DAILY., Disp: 30 g, Rfl: 0   cycloSPORINE (RESTASIS) 0.05 % ophthalmic emulsion, , Disp: , Rfl:    estradiol  (VIVELLE -DOT) 0.025 MG/24HR, Place 1 patch onto the skin 2 (two) times a week., Disp: 8 patch, Rfl: 12   Fexofenadine HCl (ALLEGRA PO), Take by mouth as needed., Disp: , Rfl:    fluticasone (FLONASE) 50 MCG/ACT nasal spray, 2 sprays daily. , Disp: , Rfl:    fluticasone-salmeterol (ADVAIR) 250-50 MCG/ACT AEPB, INHALE 1 PUFF INTO THE LUNGS EVERY 12 HOURS, Disp: 60 each, Rfl: 12   hydrochlorothiazide (HYDRODIURIL) 25 MG tablet, TAKE 1 TABLET BY MOUTH ONCE DAILY, Disp: 90 tablet, Rfl: 0   L-Lysine 1000 MG TABS, Take 1 tablet by mouth daily. (Patient taking differently: Take 500 mg by mouth daily.), Disp: , Rfl:    losartan  (COZAAR ) 25 MG tablet, TAKE 1 TABLET (25 MG TOTAL) BY MOUTH DAILY., Disp: 90 tablet, Rfl: 0   medroxyPROGESTERone  (PROVERA ) 5 MG tablet, Take 1 tablet (5 mg total) by mouth daily., Disp: 30 tablet, Rfl: 12   montelukast (SINGULAIR) 10 MG tablet, TAKE 1 TABLET BY MOUTH DAILY, Disp: 90 tablet, Rfl: 1   Omega-3 Fatty Acids (FISH OIL) 1200 MG CAPS, Take by mouth daily in the afternoon., Disp: , Rfl:    omeprazole  (PRILOSEC) 40 MG capsule, TAKE 1 CAPSULE (40 MG TOTAL) BY MOUTH DAILY., Disp: 30 capsule, Rfl: 0   Probiotic Product (PROBIOTIC PO), Take by mouth daily., Disp: , Rfl:    TRYPTYR 0.003 % SOLN, , Disp: , Rfl:    venlafaxine  XR (EFFEXOR -XR) 37.5 MG 24 hr capsule, TAKE 1 CAPSULE (37.5 MG TOTAL) BY MOUTH DAILY WITH BREAKFAST. TAKE DAILY WITH 75 MG CAPSULE., Disp: 90 capsule, Rfl: 1   venlafaxine  XR (EFFEXOR -XR) 75 MG 24 hr capsule, Take 1 capsule (75 mg total) by mouth 2 (two) times daily with a meal. Takes daily with the 37.5 mg,  Disp: 180 capsule, Rfl: 3   zaleplon  (SONATA ) 10 MG capsule, Take 1 capsule (10 mg total) by mouth at bedtime as needed for sleep., Disp: 30 capsule, Rfl: 5  "

## 2024-03-09 ENCOUNTER — Other Ambulatory Visit: Payer: Self-pay

## 2024-03-09 ENCOUNTER — Ambulatory Visit: Admitting: Psychiatry

## 2024-03-09 ENCOUNTER — Encounter: Payer: Self-pay | Admitting: Psychiatry

## 2024-03-09 VITALS — BP 126/83 | HR 89 | Temp 97.1°F | Ht 65.0 in | Wt 206.6 lb

## 2024-03-09 DIAGNOSIS — F3342 Major depressive disorder, recurrent, in full remission: Secondary | ICD-10-CM

## 2024-03-09 DIAGNOSIS — F411 Generalized anxiety disorder: Secondary | ICD-10-CM | POA: Diagnosis not present

## 2024-03-09 DIAGNOSIS — F5101 Primary insomnia: Secondary | ICD-10-CM

## 2024-03-09 MED ORDER — VENLAFAXINE HCL ER 75 MG PO CP24: 75.0000 mg | ORAL_CAPSULE | Freq: Every day | ORAL | Status: AC

## 2024-03-09 NOTE — Progress Notes (Signed)
 [facility] MD OP Progress Note  03/09/2024 2:07 PM Lisa Chambers  MRN:  [id number]  Chief Complaint:  Chief Complaint  Patient presents with   Follow-up   Anxiety   Depression   Medication Refill   Discussed the use of AI scribe software for clinical note transcription with the patient, who gave verbal consent to proceed.  History of Present Illness Lisa Chambers is a 74 year old Caucasian female, married, [place], has a history of MDD, primary insomnia, obstructive sleep apnea on CPAP, asthma, hypertension, GERD, history of thyroid  nodule was evaluated in office today for a follow-up appointment.  Intermittent memory difficulties affect her, including a recent episode on New Year's Eve when she awoke to loud noises and believed she saw her husband outside, later realizing this was likely an illusion influenced by poor vision and sleep arousal. She also describes a recent incident where she could not recall the purpose of checks written to a local farm, but the memory returned after she looked up the location. She denies difficulty recognizing familiar faces, keeping up with household chores, or experiencing issues such as burning pots while cooking due to forgetfulness. Her neurologist previously evaluated her memory concerns, and she recalls being reassured about her symptoms.  Sleep disturbances impact her, including waking in the middle of the night and feeling as though she has not slept, with these episodes occurring twice in the past 6 days. Occasional dreams involving turmoil occur, though she notes these are less frequent than in the past. Staying up late reading contributes to sleep disruption, which leads to sleeping later and feeling unwell the next day. She currently takes Sonata  for sleep and also uses hydroxyzine  as needed.   She is currently compliant on Wellbutrin  however has been taking a lower dosage of venlafaxine  at 112.5 mg daily and stopped 187.5 mg daily.  She  would like to stay on this combination of medication regimen at this time.  Denies any side effects.She denies any suicidality, homicidality or perceptual disturbances otherwise.  She appeared to be alert, oriented to person place time situation.  3 word memory immediate 3 out of 3, after 5 minutes 2 out of 3.  Patient was able to do calculation, serial sevens well.  Attention focus seems to be good in session today.  Engagement in activities such as piano lessons and periodic organizing projects at home provides her enjoyment and helps her stay busy.     Visit Diagnosis:    ICD-10-CM   1. MDD (major depressive disorder), recurrent, in full remission  F33.42 venlafaxine  XR (EFFEXOR -XR) 75 MG 24 hr capsule    2. GAD (generalized anxiety disorder)  F41.1     3. Primary insomnia  F51.01       Past Psychiatric History: I have reviewed past psychiatric history from progress note on 12/13/2019.  Past trials of Ambien, Abilify, Effexor , Adderall, Wellbutrin , Viibryd, hydroxyzine .  Previous neuropsychological testing per Dr. Channie dated 04/10/2020, results of cognitive evaluation largely within normal limits with mild frontal subcortical dysfunction.  Testing did not warrant a diagnosis of neurodevelopmental or neurocognitive disorder.  Past Medical History:  Past Medical History:  Diagnosis Date   Allergic rhinitis    Anemia    Asthma    Cancer (HCC)    skin   Chest pain    a. 01/2020 MV: EF 70%, no isch/infarct. CT attenuation correction images w/ minimal Ao athersclerosis, no significant cor Ca2+.   Depression    Generalized anxiety  disorder    GERD (gastroesophageal reflux disease)    Hearing loss    HTN (hypertension)    Hx of migraine headaches    Multinodular goiter    OSA on CPAP    Osteoarthritis    Thyroid  disease     Past Surgical History:  Procedure Laterality Date   BREAST BIOPSY Right 2021   fibroadenoma, benign   CATARACT EXTRACTION W/PHACO Left 02/15/2024    Procedure: PHACOEMULSIFICATION, CATARACT, WITH IOL INSERTION 5.85, 00:41.9;  Surgeon: Jaye Fallow, MD;  Location: [facility];  Service: Ophthalmology;  Laterality: Left;   CATARACT EXTRACTION W/PHACO Right 02/01/2024   Procedure: PHACOEMULSIFICATION, CATARACT, WITH IOL INSERTION 8.07 00:51.7;  Surgeon: Jaye Fallow, MD;  Location: [facility];  Service: Ophthalmology;  Laterality: Right;   CHOLECYSTECTOMY     COLONOSCOPY WITH PROPOFOL  N/A 10/20/2022   Procedure: COLONOSCOPY WITH PROPOFOL ;  Surgeon: Toledo, Ladell POUR, MD;  Location: [facility] ENDOSCOPY;  Service: Gastroenterology;  Laterality: N/A;   HEMOSTASIS CLIP PLACEMENT  10/20/2022   Procedure: HEMOSTASIS CLIP PLACEMENT;  Surgeon: Aundria Ladell POUR, MD;  Location: [facility];  Service: Gastroenterology;;   NASAL SINUS SURGERY  1996, 2007   POLYPECTOMY  10/20/2022   Procedure: POLYPECTOMY;  Surgeon: Aundria, Ladell POUR, MD;  Location: [facility] END[facility];  Service: Gastroenterology;;   REPLACEMENT TOTAL KNEE Bilateral 2017   TUBAL LIGATION  1994    Family Psychiatric History: I have reviewed family psychiatric history from progress note on 12/13/2019.  Family History:  Family History  Problem Relation Age of Onset   Breast cancer Mother    Lung cancer Father    Pneumonia Father    Alcohol abuse Brother    Diabetes Brother    Heart attack Brother    COPD Brother    Insomnia Brother    Colon cancer Maternal Grandmother     Social History: I have reviewed social history from progress note on 12/13/2019. Social History   Socioeconomic History   Marital status: Married    Spouse name: Francis   Number of children: 2   Years of education: Not on file   Highest education level: Bachelor's degree (e.g., BA, AB, BS)  Occupational History    Comment: retired    Associate Professor: retired  Tobacco Use   Smoking status: Never   Smokeless tobacco: Never  Vaping Use   Vaping status: Never Used  Substance and Sexual Activity    Alcohol use: No   Drug use: No   Sexual activity: Not Currently  Other Topics Concern   Not on file  Social History Narrative   Lives with husband   Caffeine- diet coke 2 daily   Social Drivers of Health   Tobacco Use: Low Risk (03/09/2024)   Patient History    Smoking Tobacco Use: Never    Smokeless Tobacco Use: Never    Passive Exposure: Not on file  Financial Resource Strain: Low Risk (03/09/2023)   Overall Financial Resource Strain (CARDIA)    Difficulty of Paying Living Expenses: Not hard at all  Food Insecurity: No Food Insecurity (03/09/2023)   Hunger Vital Sign    Worried About Running Out of Food in the Last Year: Never true    Ran Out of Food in the Last Year: Never true  Transportation Needs: No Transportation Needs (03/09/2023)   PRAPARE - Administrator, Civil Service (Medical): No    Lack of Transportation (Non-Medical): No  Physical Activity: Insufficiently Active (03/09/2023)   Exercise Vital Sign  Days of Exercise per Week: 3 days    Minutes of Exercise per Session: 20 min  Stress: Stress Concern Present (03/09/2023)   [employer] of Occupational Health - Occupational Stress Questionnaire    Feeling of Stress : To some extent  Social Connections: Socially Integrated (03/09/2023)   Social Connection and Isolation Panel    Frequency of Communication with Friends and Family: More than three times a week    Frequency of Social Gatherings with Friends and Family: Twice a week    Attends Religious Services: More than 4 times per year    Active Member of Clubs or Organizations: Yes    Attends Banker Meetings: More than 4 times per year    Marital Status: Married  Depression (PHQ2-9): Medium Risk (03/09/2024)   Depression (PHQ2-9)    PHQ-2 Score: 5  Alcohol Screen: Low Risk (01/04/2023)   Alcohol Screen    Last Alcohol Screening Score (AUDIT): 0  Housing: Low Risk (03/09/2023)   Housing Stability Vital Sign    Unable to Pay for  Housing in the Last Year: No    Number of Times Moved in the Last Year: 0    Homeless in the Last Year: No  Utilities: Not At Risk (02/15/2023)   AHC Utilities    Threatened with loss of utilities: No  Health Literacy: Adequate Health Literacy (02/15/2023)   B1300 Health Literacy    Frequency of need for help with medical instructions: Never    Allergies: Allergies[1]  Metabolic Disorder Labs: Lab Results  Component Value Date   HGBA1C 5.7 03/02/2024   No results found for: PROLACTIN Lab Results  Component Value Date   CHOL 170 04/08/2023   TRIG 115.0 04/08/2023   HDL 53.30 04/08/2023   CHOLHDL 3 04/08/2023   VLDL 23.0 04/08/2023   LDLCALC 94 04/08/2023   LDLCALC 86 09/01/2022   Lab Results  Component Value Date   TSH 0.42 03/02/2024   TSH 3.13 09/01/2022    Therapeutic Level Labs: No results found for: LITHIUM No results found for: VALPROATE No results found for: CBMZ  Current Medications: Current Outpatient Medications  Medication Sig Dispense Refill   albuterol  (VENTOLIN  HFA) 108 (90 Base) MCG/ACT inhaler Inhale 2 puffs into the lungs every 6 (six) hours as needed for wheezing or shortness of breath. 18 g 5   buPROPion  (WELLBUTRIN  XL) 300 MG 24 hr tablet Take 1 tablet (300 mg total) by mouth in the morning. DOSE INCREASE 90 tablet 1   Cholecalciferol 25 MCG (1000 UT) capsule Take by mouth daily.     clobetasol  cream (TEMOVATE ) 0.05 % APPLY 1 APPLICATION TOPICALLY 2 (TWO) TIMES DAILY. 30 g 0   cycloSPORINE (RESTASIS) 0.05 % ophthalmic emulsion      estradiol  (VIVELLE -DOT) 0.025 MG/24HR Place 1 patch onto the skin 2 (two) times a week. 8 patch 12   Fexofenadine HCl (ALLEGRA PO) Take by mouth as needed.     fluticasone (FLONASE) 50 MCG/ACT nasal spray 2 sprays daily.      fluticasone-salmeterol (ADVAIR) 250-50 MCG/ACT AEPB INHALE 1 PUFF INTO THE LUNGS EVERY 12 HOURS 60 each 12   hydrochlorothiazide (HYDRODIURIL) 25 MG tablet TAKE 1 TABLET BY MOUTH ONCE DAILY 90  tablet 0   L-Lysine 1000 MG TABS Take 1 tablet by mouth daily. (Patient taking differently: Take 500 mg by mouth daily.)     losartan  (COZAAR ) 25 MG tablet TAKE 1 TABLET (25 MG TOTAL) BY MOUTH DAILY. 90 tablet 0   medroxyPROGESTERone  (  PROVERA ) 5 MG tablet Take 1 tablet (5 mg total) by mouth daily. 30 tablet 12   montelukast (SINGULAIR) 10 MG tablet TAKE 1 TABLET BY MOUTH DAILY 90 tablet 1   Omega-3 Fatty Acids (FISH OIL) 1200 MG CAPS Take by mouth daily in the afternoon.     omeprazole  (PRILOSEC) 40 MG capsule TAKE 1 CAPSULE (40 MG TOTAL) BY MOUTH DAILY. 30 capsule 0   Probiotic Product (PROBIOTIC PO) Take by mouth daily.     TRYPTYR 0.003 % SOLN      venlafaxine  XR (EFFEXOR -XR) 37.5 MG 24 hr capsule TAKE 1 CAPSULE (37.5 MG TOTAL) BY MOUTH DAILY WITH BREAKFAST. TAKE DAILY WITH 75 MG CAPSULE. 90 capsule 1   zaleplon  (SONATA ) 10 MG capsule Take 1 capsule (10 mg total) by mouth at bedtime as needed for sleep. 30 capsule 5   venlafaxine  XR (EFFEXOR -XR) 75 MG 24 hr capsule Take 1 capsule (75 mg total) by mouth daily with breakfast. Takes daily with the 37.5 mg     No current facility-administered medications for this visit.     Musculoskeletal: Strength & Muscle Tone: within normal limits Gait & Station: normal Patient leans: N/A  Psychiatric Specialty Exam: Review of Systems  Psychiatric/Behavioral:  The patient is nervous/anxious.     Blood pressure 126/83, pulse 89, temperature (!) 97.1 F (36.2 C), temperature source Temporal, height 5' 5 (1.651 m), weight 206 lb 9.6 oz (93.7 kg).Body mass index is 34.38 kg/m.  General Appearance: Casual  Eye Contact:  Fair  Speech:  Clear and Coherent  Volume:  Normal  Mood:  Anxious  Affect:  Appropriate  Thought Process:  Goal Directed and Descriptions of Associations: Intact  Orientation:  Full (Time, Place, and Person)  Thought Content: Logical   Suicidal Thoughts:  No  Homicidal Thoughts:  No  Memory:  Immediate;   Fair Recent;    Fair Remote;   Fair  Judgement:  Fair  Insight:  Fair  Psychomotor Activity:  Normal  Concentration:  Concentration: Fair and Attention Span: Fair  Recall:  [employer] of Knowledge: Fair  Language: Fair  Akathisia:  No  Handed:  Right  AIMS (if indicated): not done  Assets:  Communication Skills Desire for Improvement Housing Social Support Talents/Skills Transportation  ADL's:  Intact  Cognition: WNL  Sleep:  Fair   Screenings: Midwife Visit from 09/29/2021 in [facility] Office Visit from 10/01/2020 in [facility]  AIMS Total Score 0 0   GAD-7    Flowsheet Row Office Visit from 03/09/2024 in [facility] Office Visit from 03/02/2024 in [facility] Office Visit from 12[facility]09/2023 in [facility] Office Visit from 10/26/2023 in [facility] Visit from 10/22/2023 in [facility]  Total GAD-7 Score 3 1 9  0 3   Mini-Mental    Flowsheet Row Office Visit from 05/22/2020 in [facility]  Total Score (max 30 points ) 29   PHQ2-9    Flowsheet Row Office Visit from 03/09/2024 in [facility] Office Visit from 03/02/2024 in [facility] Office Visit from 12/09/2023 in [facility] Office Visit from 10/26/2023 in [facility] Visit from 10/22/2023 in [place]  [facility]  PHQ-2 Total Score 1 1 4  0 0  PHQ-9 Total Score 5 2 12  0 1   Flowsheet Row Office Visit from 03/09/2024 in [facility] Admission (Discharged) from 02/15/2024 in  [facility] Admission (Discharged) from 02/01/2024 in [facility]  C-SSRS RISK CATEGORY No Risk No Risk No Risk     Assessment and Plan: Lisa Chambers is a 75 year old Caucasian female with history of depression, anxiety was evaluated in office today for a follow-up appointment, discussed assessment and plan as noted below.  1. MDD (major depressive disorder), recurrent, in full remission Currently reports doing well on the current medication regimen with regards to mood symptoms. Continue Wellbutrin  XL 300 mg daily Continue Venlafaxine  reduced dose to 112.5 mg daily  2. GAD (generalized anxiety disorder)-stable Currently doing well on the current medication regimen Continue Venlafaxine  112.5 mg daily  3. Primary insomnia-improving Currently reports sleep is overall good.  Does report struggles with sleep hygiene on and off. Continue sleep hygiene techniques Continue CPAP for OSA Continue Sonata  10 mg daily at bedtime Could use Melatonin combination drugs like sleep #3 or sleep #5. Reviewed [place] PMP AWARxE  Follow-up Follow-up in clinic in 3 months or sooner if needed.   Consent: Patient/Guardian gives verbal consent for treatment and assignment of benefits for services provided during this visit. Patient/Guardian expressed understanding and agreed to proceed.   This note was generated in part or whole with voice recognition software. Voice recognition is usually quite accurate but there are transcription errors that can and very often do occur. I apologize for any typographical errors that were not detected and corrected.    Ajooni Karam, MD 03/10/2024, 12:36 PM     [1]  Allergies Allergen Reactions   Aspirin Hives and Swelling    Lips only.  Lips only.  Lips only.   Sulfa Antibiotics Hives   Erythromycin Other (See Comments)    GI upset  Other reaction(s): Other (See Comments)  GI upset  GI upset  GI  upset  GI upset   Levofloxacin Other (See Comments)    Joint pain    Tetracycline Other (See Comments)    GI upset  Other reaction(s): Other (See Comments)  GI upset  GI upset  GI upset  GI upset

## 2024-03-14 ENCOUNTER — Ambulatory Visit

## 2024-03-14 DIAGNOSIS — Z111 Encounter for screening for respiratory tuberculosis: Secondary | ICD-10-CM

## 2024-03-14 NOTE — Progress Notes (Signed)
 Per orders of Ginger Patrick, NP, who is out of office ands Dr Greig Ring who is in office injection of TB skin test given by Laray Arenas in inside of  left forearm. Patient tolerated injection well. Patient will make appointment for 48 hrs for reading of TB skin test.

## 2024-03-16 ENCOUNTER — Ambulatory Visit: Admitting: Professional Counselor

## 2024-03-16 ENCOUNTER — Ambulatory Visit

## 2024-03-16 LAB — TB SKIN TEST
Induration: NEGATIVE mm
TB Skin Test: NEGATIVE

## 2024-03-16 NOTE — Progress Notes (Signed)
"  PPD Reading Note  PPD read and results entered in EpicCare.  Result: 0 mm induration.  Interpretation: Negative  If test not read within 48-72 hours of initial placement, patient advised to repeat in other arm 1-3 weeks after this test.  Allergic reaction: no   "

## 2024-03-29 ENCOUNTER — Ambulatory Visit: Admitting: Professional Counselor

## 2024-07-04 ENCOUNTER — Ambulatory Visit: Admitting: Psychiatry

## 2024-09-21 ENCOUNTER — Ambulatory Visit (INDEPENDENT_AMBULATORY_CARE_PROVIDER_SITE_OTHER): Admitting: Vascular Surgery

## 2024-09-21 ENCOUNTER — Encounter (INDEPENDENT_AMBULATORY_CARE_PROVIDER_SITE_OTHER)
